# Patient Record
Sex: Female | Born: 1960 | Race: White | Hispanic: Refuse to answer | Marital: Married | Smoking: Never smoker
Health system: Southern US, Community
[De-identification: ages and names within clinical notes are randomized; demographics above are authoritative.]

## PROBLEM LIST (undated history)

## (undated) DIAGNOSIS — I10 Essential (primary) hypertension: Secondary | ICD-10-CM

## (undated) DIAGNOSIS — R32 Unspecified urinary incontinence: Secondary | ICD-10-CM

## (undated) DIAGNOSIS — K589 Irritable bowel syndrome without diarrhea: Secondary | ICD-10-CM

## (undated) DIAGNOSIS — J45909 Unspecified asthma, uncomplicated: Secondary | ICD-10-CM

## (undated) DIAGNOSIS — M8580 Other specified disorders of bone density and structure, unspecified site: Secondary | ICD-10-CM

## (undated) DIAGNOSIS — K219 Gastro-esophageal reflux disease without esophagitis: Secondary | ICD-10-CM

## (undated) DIAGNOSIS — J42 Unspecified chronic bronchitis: Secondary | ICD-10-CM

## (undated) DIAGNOSIS — M797 Fibromyalgia: Secondary | ICD-10-CM

## (undated) DIAGNOSIS — D329 Benign neoplasm of meninges, unspecified: Secondary | ICD-10-CM

## (undated) DIAGNOSIS — R5382 Chronic fatigue, unspecified: Secondary | ICD-10-CM

## (undated) DIAGNOSIS — H409 Unspecified glaucoma: Secondary | ICD-10-CM

## (undated) DIAGNOSIS — R002 Palpitations: Secondary | ICD-10-CM

## (undated) HISTORY — PX: ERCP: SHX60

## (undated) HISTORY — PX: CHOLECYSTECTOMY: SHX55

## (undated) HISTORY — DX: Unspecified urinary incontinence: R32

## (undated) HISTORY — DX: Gastro-esophageal reflux disease without esophagitis: K21.9

## (undated) HISTORY — DX: Fibromyalgia: M79.7

## (undated) HISTORY — DX: Irritable bowel syndrome, unspecified: K58.9

## (undated) HISTORY — DX: Palpitations: R00.2

## (undated) HISTORY — DX: Unspecified glaucoma: H40.9

## (undated) HISTORY — DX: Other specified disorders of bone density and structure, unspecified site: M85.80

## (undated) HISTORY — DX: Unspecified chronic bronchitis: J42

## (undated) HISTORY — DX: Benign neoplasm of meninges, unspecified: D32.9

## (undated) HISTORY — DX: Chronic fatigue, unspecified: R53.82

## (undated) HISTORY — DX: Essential (primary) hypertension: I10

---

## 1987-05-26 HISTORY — PX: CHOLECYSTECTOMY: SHX55

## 2005-06-09 ENCOUNTER — Other Ambulatory Visit: Admission: RE | Admit: 2005-06-09 | Discharge: 2005-06-09 | Payer: Self-pay | Admitting: Family Medicine

## 2005-07-17 ENCOUNTER — Encounter: Admission: RE | Admit: 2005-07-17 | Discharge: 2005-07-17 | Payer: Self-pay | Admitting: Family Medicine

## 2007-07-19 ENCOUNTER — Other Ambulatory Visit: Admission: RE | Admit: 2007-07-19 | Discharge: 2007-07-19 | Payer: Self-pay | Admitting: Family Medicine

## 2007-08-18 ENCOUNTER — Encounter: Admission: RE | Admit: 2007-08-18 | Discharge: 2007-08-18 | Payer: Self-pay | Admitting: Family Medicine

## 2011-01-28 ENCOUNTER — Other Ambulatory Visit: Payer: Self-pay | Admitting: Family Medicine

## 2011-01-28 DIAGNOSIS — M545 Low back pain, unspecified: Secondary | ICD-10-CM

## 2011-01-29 ENCOUNTER — Ambulatory Visit
Admission: RE | Admit: 2011-01-29 | Discharge: 2011-01-29 | Disposition: A | Discharge status: Home / Self Care | Payer: BC Managed Care – PPO | Source: Ambulatory Visit | Attending: Family Medicine | Admitting: Family Medicine

## 2011-01-29 ENCOUNTER — Other Ambulatory Visit: Payer: Self-pay | Admitting: Family Medicine

## 2011-01-29 DIAGNOSIS — M545 Low back pain, unspecified: Secondary | ICD-10-CM

## 2011-01-30 ENCOUNTER — Other Ambulatory Visit: Payer: Self-pay | Admitting: Family Medicine

## 2011-01-30 DIAGNOSIS — M25551 Pain in right hip: Secondary | ICD-10-CM

## 2011-01-30 DIAGNOSIS — N289 Disorder of kidney and ureter, unspecified: Secondary | ICD-10-CM

## 2011-02-01 ENCOUNTER — Inpatient Hospital Stay: Admission: RE | Admit: 2011-02-01 | Payer: BC Managed Care – PPO | Source: Ambulatory Visit

## 2011-02-01 ENCOUNTER — Other Ambulatory Visit: Payer: BC Managed Care – PPO

## 2011-02-06 ENCOUNTER — Ambulatory Visit
Admission: RE | Admit: 2011-02-06 | Discharge: 2011-02-06 | Disposition: A | Discharge status: Home / Self Care | Payer: BC Managed Care – PPO | Source: Ambulatory Visit | Attending: Family Medicine | Admitting: Family Medicine

## 2011-02-06 DIAGNOSIS — M25551 Pain in right hip: Secondary | ICD-10-CM

## 2011-02-06 DIAGNOSIS — N289 Disorder of kidney and ureter, unspecified: Secondary | ICD-10-CM

## 2011-02-06 MED ORDER — GADOBENATE DIMEGLUMINE 529 MG/ML IV SOLN
18.0000 mL | Freq: Once | INTRAVENOUS | Status: AC | PRN
  Administered 2011-02-06: 18 mL via INTRAVENOUS

## 2013-10-20 ENCOUNTER — Emergency Department (HOSPITAL_COMMUNITY): Payer: BC Managed Care – PPO

## 2013-10-20 ENCOUNTER — Encounter (HOSPITAL_COMMUNITY): Payer: Self-pay | Admitting: Emergency Medicine

## 2013-10-20 ENCOUNTER — Emergency Department (HOSPITAL_COMMUNITY)
Admission: EM | Admit: 2013-10-20 | Discharge: 2013-10-20 | Disposition: A | Discharge status: Home / Self Care | Payer: BC Managed Care – PPO | Attending: Emergency Medicine | Admitting: Emergency Medicine

## 2013-10-20 DIAGNOSIS — Z7982 Long term (current) use of aspirin: Secondary | ICD-10-CM | POA: Insufficient documentation

## 2013-10-20 DIAGNOSIS — J45909 Unspecified asthma, uncomplicated: Secondary | ICD-10-CM | POA: Insufficient documentation

## 2013-10-20 DIAGNOSIS — Z79899 Other long term (current) drug therapy: Secondary | ICD-10-CM | POA: Insufficient documentation

## 2013-10-20 DIAGNOSIS — R1013 Epigastric pain: Secondary | ICD-10-CM

## 2013-10-20 HISTORY — DX: Unspecified asthma, uncomplicated: J45.909

## 2013-10-20 LAB — BASIC METABOLIC PANEL
BUN: 13 mg/dL (ref 6–23)
CALCIUM: 9 mg/dL (ref 8.4–10.5)
CHLORIDE: 103 meq/L (ref 96–112)
CO2: 26 mEq/L (ref 19–32)
CREATININE: 0.89 mg/dL (ref 0.50–1.10)
GFR calc non Af Amer: 74 mL/min — ABNORMAL LOW (ref >90)
GFR, EST AFRICAN AMERICAN: 86 mL/min — AB (ref >90)
GLUCOSE: 95 mg/dL (ref 70–99)
Potassium: 4.4 mEq/L (ref 3.7–5.3)
SODIUM: 140 meq/L (ref 137–147)

## 2013-10-20 LAB — CBC
HCT: 41.6 % (ref 36.0–46.0)
Hemoglobin: 13.9 g/dL (ref 12.0–15.0)
MCH: 31.9 pg (ref 26.0–34.0)
MCHC: 33.4 g/dL (ref 30.0–36.0)
MCV: 95.4 fL (ref 78.0–100.0)
PLATELETS: 286 10*3/uL (ref 150–400)
RBC: 4.36 MIL/uL (ref 3.87–5.11)
RDW: 13.7 % (ref 11.5–15.5)
WBC: 7.2 10*3/uL (ref 4.0–10.5)

## 2013-10-20 LAB — TROPONIN I: Troponin I: <0.3 ng/mL (ref <0.30)

## 2013-10-20 LAB — I-STAT TROPONIN, ED: Troponin i, poc: 0 ng/mL (ref 0.00–0.08)

## 2013-10-20 NOTE — Discharge Instructions (Signed)
Chest Pain (Nonspecific) °It is often hard to give a specific diagnosis for the cause of chest pain. There is always a chance that your pain could be related to something serious, such as a heart attack or a blood clot in the lungs. You need to follow up with your caregiver for further evaluation. °CAUSES  °· Heartburn. °· Pneumonia or bronchitis. °· Anxiety or stress. °· Inflammation around your heart (pericarditis) or lung (pleuritis or pleurisy). °· A blood clot in the lung. °· A collapsed lung (pneumothorax). It can develop suddenly on its own (spontaneous pneumothorax) or from injury (trauma) to the chest. °· Shingles infection (herpes zoster virus). °The chest wall is composed of bones, muscles, and cartilage. Any of these can be the source of the pain. °· The bones can be bruised by injury. °· The muscles or cartilage can be strained by coughing or overwork. °· The cartilage can be affected by inflammation and become sore (costochondritis). °DIAGNOSIS  °Lab tests or other studies, such as X-rays, electrocardiography, stress testing, or cardiac imaging, may be needed to find the cause of your pain.  °TREATMENT  °· Treatment depends on what may be causing your chest pain. Treatment may include: °· Acid blockers for heartburn. °· Anti-inflammatory medicine. °· Pain medicine for inflammatory conditions. °· Antibiotics if an infection is present. °· You may be advised to change lifestyle habits. This includes stopping smoking and avoiding alcohol, caffeine, and chocolate. °· You may be advised to keep your head raised (elevated) when sleeping. This reduces the chance of acid going backward from your stomach into your esophagus. °· Most of the time, nonspecific chest pain will improve within 2 to 3 days with rest and mild pain medicine. °HOME CARE INSTRUCTIONS  °· If antibiotics were prescribed, take your antibiotics as directed. Finish them even if you start to feel better. °· For the next few days, avoid physical  activities that bring on chest pain. Continue physical activities as directed. °· Do not smoke. °· Avoid drinking alcohol. °· Only take over-the-counter or prescription medicine for pain, discomfort, or fever as directed by your caregiver. °· Follow your caregiver's suggestions for further testing if your chest pain does not go away. °· Keep any follow-up appointments you made. If you do not go to an appointment, you could develop lasting (chronic) problems with pain. If there is any problem keeping an appointment, you must call to reschedule. °SEEK MEDICAL CARE IF:  °· You think you are having problems from the medicine you are taking. Read your medicine instructions carefully. °· Your chest pain does not go away, even after treatment. °· You develop a rash with blisters on your chest. °SEEK IMMEDIATE MEDICAL CARE IF:  °· You have increased chest pain or pain that spreads to your arm, neck, jaw, back, or abdomen. °· You develop shortness of breath, an increasing cough, or you are coughing up blood. °· You have severe back or abdominal pain, feel nauseous, or vomit. °· You develop severe weakness, fainting, or chills. °· You have a fever. °THIS IS AN EMERGENCY. Do not wait to see if the pain will go away. Get medical help at once. Call your local emergency services (911 in [place] Do not drive yourself to the hospital. °MAKE SURE YOU:  °· Understand these instructions. °· Will watch your condition. °· Will get help right away if you are not doing well or get worse. °Document Released: 02/18/2005 Document Revised: 08/03/2011 Document Reviewed: 12/15/2007 °ExitCare® Patient Information ©2014 [employer]   [employer]. °Gastroesophageal Reflux Disease, Adult °Gastroesophageal reflux disease (GERD) happens when acid from your stomach flows up into the esophagus. When acid comes in contact with the esophagus, the acid causes soreness (inflammation) in the esophagus. Over time, GERD may create small holes (ulcers) in the lining of the  esophagus. °CAUSES  °· Increased body weight. This puts pressure on the stomach, making acid rise from the stomach into the esophagus. °· Smoking. This increases acid production in the stomach. °· Drinking alcohol. This causes decreased pressure in the lower esophageal sphincter (valve or ring of muscle between the esophagus and stomach), allowing acid from the stomach into the esophagus. °· Late evening meals and a full stomach. This increases pressure and acid production in the stomach. °· A malformed lower esophageal sphincter. °Sometimes, no cause is found. °SYMPTOMS  °· Burning pain in the lower part of the mid-chest behind the breastbone and in the mid-stomach area. This may occur twice a week or more often. °· Trouble swallowing. °· Sore throat. °· Dry cough. °· Asthma-like symptoms including chest tightness, shortness of breath, or wheezing. °DIAGNOSIS  °Your caregiver may be able to diagnose GERD based on your symptoms. In some cases, X-rays and other tests may be done to check for complications or to check the condition of your stomach and esophagus. °TREATMENT  °Your caregiver may recommend over-the-counter or prescription medicines to help decrease acid production. Ask your caregiver before starting or adding any new medicines.  °HOME CARE INSTRUCTIONS  °· Change the factors that you can control. Ask your caregiver for guidance concerning weight loss, quitting smoking, and alcohol consumption. °· Avoid foods and drinks that make your symptoms worse, such as: °· Caffeine or alcoholic drinks. °· Chocolate. °· Peppermint or mint flavorings. °· Garlic and onions. °· Spicy foods. °· Citrus fruits, such as oranges, lemons, or limes. °· Tomato-based foods such as sauce, chili, salsa, and pizza. °· Fried and fatty foods. °· Avoid lying down for the 3 hours prior to your bedtime or prior to taking a nap. °· Eat small, frequent meals instead of large meals. °· Wear loose-fitting clothing. Do not wear anything  tight around your waist that causes pressure on your stomach. °· Raise the head of your bed 6 to 8 inches with wood blocks to help you sleep. Extra pillows will not help. °· Only take over-the-counter or prescription medicines for pain, discomfort, or fever as directed by your caregiver. °· Do not take aspirin, ibuprofen, or other nonsteroidal anti-inflammatory drugs (NSAIDs). °SEEK IMMEDIATE MEDICAL CARE IF:  °· You have pain in your arms, neck, jaw, teeth, or back. °· Your pain increases or changes in intensity or duration. °· You develop nausea, vomiting, or sweating (diaphoresis). °· You develop shortness of breath, or you faint. °· Your vomit is green, yellow, black, or looks like coffee grounds or blood. °· Your stool is red, bloody, or black. °These symptoms could be signs of other problems, such as heart disease, gastric bleeding, or esophageal bleeding. °MAKE SURE YOU:  °· Understand these instructions. °· Will watch your condition. °· Will get help right away if you are not doing well or get worse. °Document Released: 02/18/2005 Document Revised: 08/03/2011 Document Reviewed: 11/28/2010 °ExitCare® Patient Information ©2014 [employer]. ° °

## 2013-10-20 NOTE — ED Notes (Addendum)
Pt c/o center to left chest pain onset 0300 this morning with some shortness of breath. Pt talking in complete sentences without difficulty. Skin w/d. Pt describes pain as burning and aching. Pt reports that she took 405 mg of ASA at 0330. Pt also reports that she took 162 mg of ASA before bed

## 2013-10-20 NOTE — ED Provider Notes (Signed)
CSN: [id number]     Arrival date & time 10/20/13  4098 History   First MD Initiated Contact with Patient 10/20/13 0732     Chief Complaint  Patient presents with  . Chest Pain     (Consider location/radiation/quality/duration/timing/severity/associated sxs/prior Treatment) HPI 53 year old female comes today complaining of chest pain. She states she has had some intermittent chest pain over the extended period of time. 6 months ago she had a stress test done which she says was normal. She has intermittent sharp pain that is momentary in nature that has woken her up at night that prompted these exams. She states she continues to have this intermittent sharp pain that wakes her up but goes away after second. Last night the same pain woke her up but continued to come in waves over 3 hours. She denies any associated symptoms such as dyspnea, diaphoresis, nausea, or vomiting with this. She points to the epigastrium as the location and does not feel that it radiates. She is unable to state any other assistive factors. She denies cough or fever. She has some history of wheezing with infections. She does not take any daily medications.  Past Medical History  Diagnosis Date  . Asthma    Past Surgical History  Procedure Laterality Date  . Cholecystectomy     No family history on file. History  Substance Use Topics  . Smoking status: Never Smoker   . Smokeless tobacco: Not on file  . Alcohol Use: Yes     Comment: rare   OB History   Grav Para Term Preterm Abortions TAB SAB Ect Mult Living                 Review of Systems  All other systems reviewed and are negative.     Allergies  Review of patient's allergies indicates no known allergies.  Home Medications   Prior to Admission medications   Medication Sig Start Date End Date Taking? Authorizing Provider  aspirin EC 81 MG tablet Take 81 mg by mouth daily.   Yes Historical Provider, MD  esomeprazole (NEXIUM) 20 MG capsule Take 20  mg by mouth daily as needed (heartburn).   Yes Historical Provider, MD  Vitamin D, Ergocalciferol, (DRISDOL) 50000 UNITS CAPS capsule Take 1 capsule by mouth every 7 (seven) days. Every Friday 10/06/13   Historical Provider, MD   BP 163/101  Pulse 73  Temp(Src) 97.8 F (36.6 C) (Oral)  Resp 20  Ht 5\' 5"  (1.651 m)  Wt 170 lb (77.111 kg)  BMI 28.29 kg/m2  SpO2 100% Physical Exam  Nursing note and vitals reviewed. Constitutional: She is oriented to person, place, and time. She appears well-developed and well-nourished.  HENT:  Head: Normocephalic and atraumatic.  Right Ear: External ear normal.  Left Ear: External ear normal.  Nose: Nose normal.  Mouth/Throat: Oropharynx is clear and moist.  Eyes: Conjunctivae and EOM are normal. Pupils are equal, round, and reactive to light.  Neck: Normal range of motion. Neck supple. No JVD present. No tracheal deviation present. No thyromegaly present.  Cardiovascular: Normal rate, regular rhythm, normal heart sounds and intact distal pulses.   Pulmonary/Chest: Effort normal and breath sounds normal. No respiratory distress. She has no wheezes.  Abdominal: Soft. Bowel sounds are normal. She exhibits no mass. There is no tenderness. There is no guarding.  Musculoskeletal: Normal range of motion.  Lymphadenopathy:    She has no cervical adenopathy.  Neurological: She is alert and oriented to person, place, and  time. She has normal reflexes. No cranial nerve deficit or sensory deficit. Gait normal. GCS eye subscore is 4. GCS verbal subscore is 5. GCS motor subscore is 6.  Reflex Scores:      Bicep reflexes are 2+ on the right side and 2+ on the left side.      Patellar reflexes are 2+ on the right side and 2+ on the left side. Strength is 5/5 bilateral elbow flexor/extensors, wrist extension/flexion, intrinsic hand strength equal Bilateral hip flexion/extension 5/5, knee flexion/extension 5/5, ankle 5/5 flexion extension    Skin: Skin is warm and  dry.  Psychiatric: She has a normal mood and affect. Her behavior is normal. Judgment and thought content normal.    ED Course  Procedures (including critical care time) Labs Review Labs Reviewed  BASIC METABOLIC PANEL - Abnormal; Notable for the following:    GFR calc non Af Amer 74 (*)    GFR calc Af Amer 86 (*)    All other components within normal limits  CBC  TROPONIN I  I-STAT TROPOININ, ED    Imaging Review Dg Chest 2 View (if Patient Has Fever And/or Copd)  10/20/2013   CLINICAL DATA:  Chest pain .  EXAM: CHEST  2 VIEW  COMPARISON:  Chest x-Gilmer Kaminsky 06/19/2013.  FINDINGS: The heart size and mediastinal contours are within normal limits. Both lungs are clear. The visualized skeletal structures are unremarkable.  IMPRESSION: No active cardiopulmonary disease.  Stable exam.   Electronically Signed   By: Marcello Moores  Register   On: 10/20/2013 07:29     EKG Interpretation   Date/Time:  Friday Oct 20 2013 10:20:08 EDT Ventricular Rate:  59 PR Interval:  141 QRS Duration: 99 QT Interval:  433 QTC Calculation: 429 R Axis:   67 Text Interpretation:  Sinus rhythm Low voltage, precordial leads Confirmed  by Awad Gladd MD, Andee Poles ([id number]) on 10/20/2013 12:03:58 PM      MDM   Final diagnoses:  Epigastric pain    53 year old previously healthy female presents today with atypical chest pain. It lasted a total of 3 hours but has since resolved. First troponin and EKG are normal and delta  2 hours EKG and troponin are obtained with continued normal troponin  patient advised to followup with her primary care physician. She is given return precautions and voices understanding to    Shaune Pollack, MD 10/20/13 1206

## 2013-10-20 NOTE — ED Notes (Signed)
Pt alert x4 respirations easy non labored. Skin w/d 

## 2013-10-24 ENCOUNTER — Ambulatory Visit
Admission: RE | Admit: 2013-10-24 | Discharge: 2013-10-24 | Disposition: A | Discharge status: Home / Self Care | Payer: BC Managed Care – PPO | Source: Ambulatory Visit

## 2013-10-24 ENCOUNTER — Other Ambulatory Visit: Payer: Self-pay

## 2013-10-24 DIAGNOSIS — Z1231 Encounter for screening mammogram for malignant neoplasm of breast: Secondary | ICD-10-CM

## 2013-11-22 HISTORY — PX: TRANSTHORACIC ECHOCARDIOGRAM: SHX275

## 2013-11-27 ENCOUNTER — Other Ambulatory Visit (HOSPITAL_COMMUNITY): Payer: Self-pay | Admitting: Family Medicine

## 2013-11-27 DIAGNOSIS — R002 Palpitations: Secondary | ICD-10-CM

## 2013-12-11 ENCOUNTER — Inpatient Hospital Stay (HOSPITAL_COMMUNITY): Admission: RE | Admit: 2013-12-11 | Payer: BC Managed Care – PPO | Source: Ambulatory Visit

## 2013-12-14 ENCOUNTER — Ambulatory Visit (HOSPITAL_COMMUNITY)
Admission: RE | Admit: 2013-12-14 | Discharge: 2013-12-14 | Disposition: A | Discharge status: Home / Self Care | Payer: BC Managed Care – PPO | Source: Ambulatory Visit | Attending: Family Medicine | Admitting: Family Medicine

## 2013-12-14 DIAGNOSIS — I369 Nonrheumatic tricuspid valve disorder, unspecified: Secondary | ICD-10-CM

## 2013-12-14 DIAGNOSIS — R002 Palpitations: Secondary | ICD-10-CM | POA: Insufficient documentation

## 2013-12-14 NOTE — Progress Notes (Signed)
2D Echo Performed 12/14/2013    Marygrace Drought, RCS

## 2013-12-16 ENCOUNTER — Telehealth: Payer: Self-pay | Admitting: Cardiovascular Disease

## 2013-12-16 NOTE — Telephone Encounter (Signed)
Closed encounter °

## 2014-01-25 ENCOUNTER — Ambulatory Visit: Payer: BC Managed Care – PPO | Admitting: Cardiovascular Disease

## 2014-02-14 ENCOUNTER — Other Ambulatory Visit: Payer: Self-pay | Admitting: Family Medicine

## 2014-02-14 DIAGNOSIS — R198 Other specified symptoms and signs involving the digestive system and abdomen: Secondary | ICD-10-CM

## 2014-02-16 ENCOUNTER — Ambulatory Visit (INDEPENDENT_AMBULATORY_CARE_PROVIDER_SITE_OTHER): Payer: BC Managed Care – PPO

## 2014-02-16 DIAGNOSIS — R198 Other specified symptoms and signs involving the digestive system and abdomen: Secondary | ICD-10-CM

## 2014-02-16 DIAGNOSIS — R002 Palpitations: Secondary | ICD-10-CM

## 2014-03-09 ENCOUNTER — Other Ambulatory Visit: Payer: Self-pay

## 2015-05-31 ENCOUNTER — Ambulatory Visit (INDEPENDENT_AMBULATORY_CARE_PROVIDER_SITE_OTHER): Payer: BLUE CROSS/BLUE SHIELD | Admitting: Family Medicine

## 2015-05-31 ENCOUNTER — Encounter: Payer: Self-pay | Admitting: Family Medicine

## 2015-05-31 VITALS — BP 129/87 | HR 72 | Temp 98.1°F | Resp 20 | Ht 65.0 in | Wt 169.0 lb

## 2015-05-31 DIAGNOSIS — R319 Hematuria, unspecified: Secondary | ICD-10-CM

## 2015-05-31 DIAGNOSIS — Z87440 Personal history of urinary (tract) infections: Secondary | ICD-10-CM | POA: Diagnosis not present

## 2015-05-31 DIAGNOSIS — Z7189 Other specified counseling: Secondary | ICD-10-CM

## 2015-05-31 DIAGNOSIS — Z23 Encounter for immunization: Secondary | ICD-10-CM | POA: Diagnosis not present

## 2015-05-31 DIAGNOSIS — Z7689 Persons encountering health services in other specified circumstances: Secondary | ICD-10-CM

## 2015-05-31 DIAGNOSIS — N390 Urinary tract infection, site not specified: Secondary | ICD-10-CM | POA: Diagnosis not present

## 2015-05-31 LAB — URINALYSIS, ROUTINE W REFLEX MICROSCOPIC
Bilirubin Urine: NEGATIVE
KETONES UR: NEGATIVE
Leukocytes, UA: NEGATIVE
Nitrite: NEGATIVE
SPECIFIC GRAVITY, URINE: 1.015 (ref 1.000–1.030)
Total Protein, Urine: NEGATIVE
UROBILINOGEN UA: 0.2 (ref 0.0–1.0)
Urine Glucose: NEGATIVE
pH: 6.5 (ref 5.0–8.0)

## 2015-05-31 LAB — POC URINALSYSI DIPSTICK (AUTOMATED)
BILIRUBIN UA: NEGATIVE
Glucose, UA: NEGATIVE
Ketones, UA: NEGATIVE
Leukocytes, UA: NEGATIVE
NITRITE UA: NEGATIVE
PH UA: 6.5
Protein, UA: NEGATIVE
Spec Grav, UA: 1.02
UROBILINOGEN UA: 0.2

## 2015-05-31 NOTE — Progress Notes (Signed)
Subjective:    Patient ID: Rebecca Pollard, female    DOB: [birth year], 55 y.o.   MRN: [id number]  HPI   Patient presents for new patient establishment with UTI. All past medical history, surgical history, allergies, family history, immunizations and social history was obtained from the patient today and entered into the electronic medical record. Records are requested from her prior PCP, and will be reviewed at the time they are received. All medical records will be updated at that time.  UTI: [facility] with [facility]TI on 05/23/2015. She was treated with macrobid x 5 d. Initial symptoms consist of fever, chills, urinary frequency, dysuria, discolored urine, foul odor to urine. Currently she endorses resolution of symptoms.  Pt denies nausea, vomit, fever, chills, low back pain, dysuria or urinary frequency.  Last couple of years she has been getting a UTI 3-4x a year.   Past Medical History  Diagnosis Date  . Asthma   . Hypertension   . GERD (gastroesophageal reflux disease)   . Glaucoma   . Urinary incontinence    No Known Allergies Past Surgical History  Procedure Laterality Date  . Cholecystectomy     Family History  Problem Relation Age of Onset  . Hearing loss Mother   . Arthritis Father   . Hearing loss Father    Social History   Social History  . Marital Status: Married    Spouse Name: N/A  . Number of Children: N/A  . Years of Education: N/A   Occupational History  . Not on file.   Social History Main Topics  . Smoking status: Never Smoker   . Smokeless tobacco: Never Used  . Alcohol Use: Yes     Comment: rare  . Drug Use: No  . Sexual Activity: Yes    Birth Control/ Protection: Other-see comments     Comment: husband with vasectomy   Other Topics Concern  . Not on file   Social History Narrative   Married to [place]. 2 children, Alice and Bar Nunn.    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    Drinks caffeine, herbal  remedies, daily vitamin use.    Wears her seatbelt, smoke detector in the home, no firearms in the home.    Feels safe in her relationships.    Exercises 2x a week.     Review of Systems Negative, with the exception of above mentioned in HPI     Objective:   Physical Exam BP 129/87 mmHg  Pulse 72  Temp(Src) 98.1 F (36.7 C)  Resp 20  Ht 5\' 5"  (1.651 m)  Wt 169 lb (76.658 kg)  BMI 28.12 kg/m2  SpO2 97%  LMP 04/15/2015 Gen: Afebrile. No acute distress. Nontoxic in appearance, well-developed, well-nourished, Caucasian female, Pleasant. HENT: AT. [place]. Bilateral TM visualized and normal in appearance. MMM. Bilateral nares without erythema or swelling. Throat without erythema or exudates. Eyes:Pupils Equal Round Reactive to light, Extraocular movements intact,  Conjunctiva without redness, discharge or icterus. Neck/lymp/endocrine: Supple, no lymphadenopathy CV: RRR, No edema, +2/4 P posterior tibialis pulses Chest: CTAB, no wheeze or crackles Abd: Soft. Flat. NTND. BS present. No  Masses palpated.  Skin: No rashes, purpura or petechiae.  Neuro: Normal gait. PERLA. EOMi. Alert. Oriented x3  Psych: Normal affect, dress and demeanor. Normal speech. Normal thought content and judgment.    Assessment & Plan:  Rebecca Pollard is a 55 y.o. female present for establishment of care and follo wup to [facility]  [facility]. 1. Recent urinary tract infection/Urinary tract infection with hematuria, site unspecified - POCT Urinalysis Dipstick (Automated) - Urinalysis, Routine w reflex microscopic - small blood on urine dip- pt states she is told she always has blood in her urine. - AVS on bladder irritants (outisde source) and interstitial cystitis.   2. Need for prophylactic vaccination and inoculation against influenza - Flu Vaccine QUAD 36+ mos PF IM (Fluarix & Fluzone Quad PF)  Patient to follow-up with 2 months for complete physical (would repeat urine at that time)

## 2015-05-31 NOTE — Patient Instructions (Signed)
Interstitial Cystitis Interstitial cystitis is a condition that causes inflammation of the bladder. The bladder is a hollow organ in the lower part of your abdomen. It stores urine after the urine is made by your kidneys. With interstitial cystitis, you may have pain in the bladder area. You may also have a frequent and urgent need to urinate. The severity of interstitial cystitis can vary from person to person. You may have flare-ups of the condition, and then it may go away for a while. For many people who have this condition, it becomes a long-term problem. CAUSES The cause of this condition is not known. RISK FACTORS This condition is more likely to develop in women. SYMPTOMS Symptoms of interstitial cystitis vary, and they can change over time. Symptoms may include:  Discomfort or pain in the bladder area. This can range from mild to severe. The pain may change in intensity as the bladder fills with urine or as it empties.  Pelvic pain.  An urgent need to urinate.  Frequent urination.  Pain during sexual intercourse.  Pinpoint bleeding on the bladder wall. For women, the symptoms often get worse during menstruation. DIAGNOSIS This condition is diagnosed by evaluating your symptoms and ruling out other causes. A physical exam will be done. Various tests may be done to rule out other conditions. Common tests include:  Urine tests.  Cystoscopy. In this test, a tool that is like a very thin telescope is used to look into your bladder.  Biopsy. This involves taking a sample of tissue from the bladder wall to be examined under a microscope. TREATMENT There is no cure for interstitial cystitis, but treatment methods are available to control your symptoms. Work closely with your health care provider to find the treatments that will be most effective for you. Treatment options may include:  Medicines to relieve pain and to help reduce the number of times that you feel the need to  urinate.  Bladder training. This involves learning ways to control when you urinate, such as:  Urinating at scheduled times.  Training yourself to delay urination.  Doing exercises (Kegel exercises) to strengthen the muscles that control urine flow.  Lifestyle changes, such as changing your diet or taking steps to control stress.  Use of a device that provides electrical stimulation in order to reduce pain.  A procedure that stretches your bladder by filling it with air or fluid.  Surgery. This is rare. It is only done for extreme cases if other treatments do not help. HOME CARE INSTRUCTIONS  Take medicines only as directed by your health care provider.  Use bladder training techniques as directed.  Keep a bladder diary to find out which foods, liquids, or activities make your symptoms worse.  Use your bladder diary to schedule bathroom trips. If you are away from home, plan to be near a bathroom at each of your scheduled times.  Make sure you urinate just before you leave the house and just before you go to bed.  Do Kegel exercises as directed by your health care provider.  Do not drink alcohol.  Do not use any tobacco products, including cigarettes, chewing tobacco, or electronic cigarettes. If you need help quitting, ask your health care provider.  Make dietary changes as directed by your health care provider. You may need to avoid spicy foods and foods that contain a high amount of potassium.  Limit your drinking of beverages that stimulate urination. These include soda, coffee, and tea.  Keep all follow-up   visits as directed by your health care provider. This is important. SEEK MEDICAL CARE IF:  Your symptoms do not get better after treatment.  Your pain and discomfort are getting worse.  You have more frequent urges to urinate.  You have a fever. SEEK IMMEDIATE MEDICAL CARE IF:  You are not able to control your bladder at all.   This information is not  intended to replace advice given to you by your health care provider. Make sure you discuss any questions you have with your health care provider.   Document Released: 01/10/2004 Document Revised: 06/01/2014 Document Reviewed: 01/16/2014 Elsevier Interactive Patient Education [employer].  2 months or time when it is at least 1 year and 1 day past your last physical.

## 2015-06-03 ENCOUNTER — Encounter: Payer: Self-pay | Admitting: Family Medicine

## 2015-07-29 ENCOUNTER — Ambulatory Visit (INDEPENDENT_AMBULATORY_CARE_PROVIDER_SITE_OTHER): Payer: BLUE CROSS/BLUE SHIELD | Admitting: Family Medicine

## 2015-07-29 ENCOUNTER — Encounter: Payer: Self-pay | Admitting: Family Medicine

## 2015-07-29 VITALS — BP 132/88 | HR 79 | Temp 98.5°F | Resp 20 | Wt 165.5 lb

## 2015-07-29 DIAGNOSIS — Z780 Asymptomatic menopausal state: Secondary | ICD-10-CM | POA: Insufficient documentation

## 2015-07-29 DIAGNOSIS — N951 Menopausal and female climacteric states: Secondary | ICD-10-CM

## 2015-07-29 MED ORDER — CONJ ESTROG-MEDROXYPROGEST ACE 0.3-1.5 MG PO TABS: 1.0000 | ORAL_TABLET | Freq: Every day | ORAL | Status: DC

## 2015-07-29 NOTE — Progress Notes (Signed)
Patient ID: Rebecca Pollard, female   DOB: 1960/10/05, 55 y.o.   MRN: [id number]    Rebecca Pollard , November 27, 1960, 55 y.o., female MRN: [id number]  CC: perimenopausal  Subjective: Pt presents for an OV with complaints of hot flashes, night time awakenings (insomnia),  Chronic fatigue, mood shifts, vaginal dryness. Patient's last menstrual period was 04/24/2015. Has had a few periods intermittent over the last year. Patient states the hot flashes are unbearable.   Low CV risk by calculation. Low 1% breast-cancer risk assessment.  No h/o DVT/blood clots.  No history of breast cancer.  Menses at 13.  3 childbirths.  Never smoker Pt did have palpitations and was on BB, at one time, she had discontinued that while ago and has had no return of her palpitations. Normal cardiac workup.    No Known Allergies Social History  Substance Use Topics  . Smoking status: Never Smoker   . Smokeless tobacco: Never Used  . Alcohol Use: Yes     Comment: rare   Past Medical History  Diagnosis Date  . Asthma   . Hypertension   . GERD (gastroesophageal reflux disease)   . Glaucoma   . Urinary incontinence   . Fibromyalgia   . IBS (irritable bowel syndrome)   . Chronic fatigue    Past Surgical History  Procedure Laterality Date  . Cholecystectomy     Family History  Problem Relation Age of Onset  . Hearing loss Mother   . Arthritis Father   . Hearing loss Father      Medication List       This list is accurate as of: 07/29/15  2:46 PM.  Always use your most recent med list.               aspirin EC 81 MG tablet  Take 81 mg by mouth daily.     Vitamin D (Ergocalciferol) 50000 units Caps capsule  Commonly known as:  DRISDOL  Take 1 capsule by mouth every 7 (seven) days. Every Friday         ROS: Negative, with the exception of above mentioned in HPI   Objective:  BP 132/88 mmHg  Pulse 79  Temp(Src) 98.5 F (36.9 C) (Oral)  Resp 20  Wt 165 lb 8 oz (75.07 kg)  SpO2 98%  LMP  04/24/2015 Body mass index is 27.54 kg/(m^2). Gen: Afebrile. No acute distress. Nontoxic in appearance. Well developed, well nourished. Female.  HENT: AT. [place].MMM, no oral lesions.  Eyes:Pupils Equal Round Reactive to light, Extraocular movements intact,  Conjunctiva without redness, discharge or icterus. CV: RRR  Abd: Soft.  NTND. BS present Neuro:Normal gait. PERLA. EOMi. Alert. Oriented x3 Psych: Normal affect, dress and demeanor. Normal speech. Normal thought content and judgment..   Assessment/Plan: Rebecca Pollard is a 55 y.o. female present for OV for  1. Perimenopausal symptoms - in depth discussion on the many possible variations on treatment. Pt decided on hormone therapy (low dose). Pt will need dual therapy,since she has a uterus,  and this was discussed with her.  - She does not have any contraindications that I am aware of. Discussed yearly mammograms with use. She did have palpitations in the past, discussed we will need to monitor for return of palpitations with this medication.  - estrogen, conjugated,-medroxyprogesterone (PREMPRO) 0.3-1.5 MG tablet; Take 1 tablet by mouth daily.  Dispense: 30 tablet; Refill: 1 - F/U 4 weeks.    electronically signed by:  Howard Pouch, DO  [facility] -  OR

## 2015-07-29 NOTE — Patient Instructions (Signed)
Perimenopause Perimenopause is the time when your body begins to move into the menopause (no menstrual period for 12 straight months). It is a natural process. Perimenopause can begin 2-8 years before the menopause and usually lasts for 1 year after the menopause. During this time, your ovaries may or may not produce an egg. The ovaries vary in their production of estrogen and progesterone hormones each month. This can cause irregular menstrual periods, difficulty getting pregnant, vaginal bleeding between periods, and uncomfortable symptoms. CAUSES  Irregular production of the ovarian hormones, estrogen and progesterone, and not ovulating every month.  Other causes include:  Tumor of the pituitary gland in the brain.  Medical disease that affects the ovaries.  Radiation treatment.  Chemotherapy.  Unknown causes.  Heavy smoking and excessive alcohol intake can bring on perimenopause sooner. SIGNS AND SYMPTOMS   Hot flashes.  Night sweats.  Irregular menstrual periods.  Decreased sex drive.  Vaginal dryness.  Headaches.  Mood swings.  Depression.  Memory problems.  Irritability.  Tiredness.  Weight gain.  Trouble getting pregnant.  The beginning of losing bone cells (osteoporosis).  The beginning of hardening of the arteries (atherosclerosis). DIAGNOSIS  Your health care provider will make a diagnosis by analyzing your age, menstrual history, and symptoms. He or she will do a physical exam and note any changes in your body, especially your female organs. Female hormone tests may or may not be helpful depending on the amount of female hormones you produce and when you produce them. However, other hormone tests may be helpful to rule out other problems. TREATMENT  In some cases, no treatment is needed. The decision on whether treatment is necessary during the perimenopause should be made by you and your health care provider based on how the symptoms are affecting you  and your lifestyle. Various treatments are available, such as:  Treating individual symptoms with a specific medicine for that symptom.  Herbal medicines that can help specific symptoms.  Counseling.  Group therapy. HOME CARE INSTRUCTIONS   Keep track of your menstrual periods (when they occur, how heavy they are, how long between periods, and how long they last) as well as your symptoms and when they started.  Only take over-the-counter or prescription medicines as directed by your health care provider.  Sleep and rest.  Exercise.  Eat a diet that contains calcium (good for your bones) and soy (acts like the estrogen hormone).  Do not smoke.  Avoid alcoholic beverages.  Take vitamin supplements as recommended by your health care provider. Taking vitamin E may help in certain cases.  Take calcium and vitamin D supplements to help prevent bone loss.  Group therapy is sometimes helpful.  Acupuncture may help in some cases. SEEK MEDICAL CARE IF:   You have questions about any symptoms you are having.  You need a referral to a specialist (gynecologist, psychiatrist, or psychologist). SEEK IMMEDIATE MEDICAL CARE IF:   You have vaginal bleeding.  Your period lasts longer than 8 days.  Your periods are recurring sooner than 21 days.  You have bleeding after intercourse.  You have severe depression.  You have pain when you urinate.  You have severe headaches.  You have vision problems.   This information is not intended to replace advice given to you by your health care provider. Make sure you discuss any questions you have with your health care provider.   Document Released: 06/18/2004 Document Revised: 06/01/2014 Document Reviewed: 12/08/2012 Elsevier Interactive Patient Education 2016 [employer]    I have called in a combination pill of estrogen and progesterone. Take as indicated in the packet.  I will need to followup in 4 weeks.

## 2015-08-07 ENCOUNTER — Telehealth: Payer: Self-pay | Admitting: Family Medicine

## 2015-08-07 NOTE — Telephone Encounter (Signed)
Pt is asking for a call back regarding antibiotics that she has taking in the pass. Pt needs the name, what they were for, and when she took them.

## 2015-08-07 NOTE — Telephone Encounter (Signed)
Spoke with patient recommended she call her pharmacy to get information.

## 2015-10-28 DIAGNOSIS — H527 Unspecified disorder of refraction: Secondary | ICD-10-CM | POA: Diagnosis not present

## 2015-10-28 DIAGNOSIS — H01009 Unspecified blepharitis unspecified eye, unspecified eyelid: Secondary | ICD-10-CM | POA: Diagnosis not present

## 2015-10-28 DIAGNOSIS — H2513 Age-related nuclear cataract, bilateral: Secondary | ICD-10-CM | POA: Diagnosis not present

## 2015-10-28 DIAGNOSIS — H409 Unspecified glaucoma: Secondary | ICD-10-CM | POA: Diagnosis not present

## 2015-12-30 ENCOUNTER — Encounter: Payer: Self-pay | Admitting: Family Medicine

## 2015-12-30 ENCOUNTER — Ambulatory Visit (INDEPENDENT_AMBULATORY_CARE_PROVIDER_SITE_OTHER): Payer: BLUE CROSS/BLUE SHIELD | Admitting: Family Medicine

## 2015-12-30 VITALS — BP 122/83 | HR 70 | Temp 98.5°F | Resp 20 | Wt 176.8 lb

## 2015-12-30 DIAGNOSIS — R1011 Right upper quadrant pain: Secondary | ICD-10-CM | POA: Diagnosis not present

## 2015-12-30 LAB — COMPREHENSIVE METABOLIC PANEL
ALT: 12 U/L (ref 6–29)
AST: 16 U/L (ref 10–35)
Albumin: 4.1 g/dL (ref 3.6–5.1)
Alkaline Phosphatase: 80 U/L (ref 33–130)
BUN: 13 mg/dL (ref 7–25)
CHLORIDE: 107 mmol/L (ref 98–110)
CO2: 24 mmol/L (ref 20–31)
Calcium: 9.3 mg/dL (ref 8.6–10.4)
Creat: 0.81 mg/dL (ref 0.50–1.05)
GLUCOSE: 84 mg/dL (ref 65–99)
POTASSIUM: 4.4 mmol/L (ref 3.5–5.3)
Sodium: 140 mmol/L (ref 135–146)
TOTAL PROTEIN: 6.7 g/dL (ref 6.1–8.1)
Total Bilirubin: 0.6 mg/dL (ref 0.2–1.2)

## 2015-12-30 LAB — CBC WITH DIFFERENTIAL/PLATELET
BASOS ABS: 0 {cells}/uL (ref 0–200)
Basophils Relative: 0 %
EOS ABS: 150 {cells}/uL (ref 15–500)
Eosinophils Relative: 2 %
HCT: 45 % (ref 35.0–45.0)
Hemoglobin: 15 g/dL (ref 11.7–15.5)
LYMPHS PCT: 24 %
Lymphs Abs: 1800 cells/uL (ref 850–3900)
MCH: 31.3 pg (ref 27.0–33.0)
MCHC: 33.3 g/dL (ref 32.0–36.0)
MCV: 93.9 fL (ref 80.0–100.0)
MONOS PCT: 7 %
MPV: 11.6 fL (ref 7.5–12.5)
Monocytes Absolute: 525 cells/uL (ref 200–950)
Neutro Abs: 5025 cells/uL (ref 1500–7800)
Neutrophils Relative %: 67 %
Platelets: 301 10*3/uL (ref 140–400)
RBC: 4.79 MIL/uL (ref 3.80–5.10)
RDW: 14.1 % (ref 11.0–15.0)
WBC: 7.5 10*3/uL (ref 3.8–10.8)

## 2015-12-30 LAB — C-REACTIVE PROTEIN: CRP: <0.5 mg/dL (ref <0.60)

## 2015-12-30 MED ORDER — BACLOFEN 10 MG PO TABS: 10.0000 mg | ORAL_TABLET | Freq: Two times a day (BID) | ORAL | 0 refills | Status: DC | PRN

## 2015-12-30 NOTE — Progress Notes (Signed)
Rebecca Pollard , [birth year], 55 y.o., female MRN: [id number] Patient Care Team    Relationship Specialty Notifications Start End  Ma Hillock, DO PCP - General Family Medicine  05/31/15     CC: abdomen pain  Subjective: Pt presents for an acute OV with complaints of RUQ pain of 1 day duration.  Associated symptoms include Recent increase in activity. Patient feels a spasm/aching feeling in her right lateral flank/abdomen. Patient has had a cholecystectomy. She states she experienced this pain while sitting on her couch eating popcorn, with minimal butter. Pain was very strong and was a spasm in nature, and did not resolve for a few hours. She states the pain/achiness portion is still mildly present. She states for dinner she had had grilled vegetables just about an hour prior to the onset of pain. Her bowels are moving regularly proximally 1 today. She denies nausea, vomit, constipation or diarrhea. She has never had a colonoscopy. She does admit to raking the yard the day prior. She states she might have a mild fever, no chills, no night sweats or unintentional weight loss. She has a history of IBS and GERD.  No Known Allergies Social History  Substance Use Topics  . Smoking status: Never Smoker  . Smokeless tobacco: Never Used  . Alcohol use Yes     Comment: rare   Past Medical History:  Diagnosis Date  . Asthma   . Chronic fatigue   . Fibromyalgia   . GERD (gastroesophageal reflux disease)   . Glaucoma   . Hypertension   . IBS (irritable bowel syndrome)   . Urinary incontinence    Past Surgical History:  Procedure Laterality Date  . CHOLECYSTECTOMY     Family History  Problem Relation Age of Onset  . Hearing loss Mother   . Arthritis Father   . Hearing loss Father      Medication List       Accurate as of 12/30/15  3:17 PM. Always use your most recent med list.          aspirin EC 81 MG tablet Take 81 mg by mouth daily.   PREVIDENT 5000 BOOSTER PLUS 1.1 %  Pste Generic drug:  Sodium Fluoride   Vitamin D (Ergocalciferol) 50000 units Caps capsule Commonly known as:  DRISDOL Take 1 capsule by mouth every 7 (seven) days. Every Friday       No results found for this or any previous visit (from the past 24 hour(s)). No results found.   ROS: Negative, with the exception of above mentioned in HPI   Objective:  BP 122/83 (BP Location: Right Arm, Patient Position: Sitting, Cuff Size: Normal)   Pulse 70   Temp 98.5 F (36.9 C) (Oral)   Resp 20   Wt 176 lb 12 oz (80.2 kg)   SpO2 96%   BMI 29.41 kg/m  Body mass index is 29.41 kg/m. Gen: Afebrile. No acute distress. Nontoxic in appearance, well developed, well nourished.  HENT: AT. [place]. MMM, no oral lesions.  Eyes:Pupils Equal Round Reactive to light, Extraocular movements intact,  Conjunctiva without redness, discharge or icterus. CV: RRR Chest: CTAB, no wheeze or crackles.   Abd: Soft. Round.ND. Tender to palpation superficially right flank, just below rib cage. BS present. No Masses palpated. No rebound or guarding. Negative Murphy's, negative McBurney's. MSK: No CVA tenderness bilaterally Skin: No rashes, purpura or petechiae.  Neuro:  Normal gait. PERLA. EOMi. Alert. Oriented x3  Psych: Normal affect, dress and demeanor.  Normal speech. Normal thought content and judgment.  Assessment/Plan: Rebecca Pollard is a 55 y.o. female present for acute OV for  RUQ pain - Pain likely secondary to strain muscle considering location exam. We'll collect labs today to rule out infection, inflammatory or abnormal liver function.  - Encourage patient to use NSAIDs and heat/ice therapy. - Baclofen prescribed. - CBC w/Diff - Comp Met (CMET) - C-reactive protein - Follow-up in 2-4 weeks if symptoms are not resolved, or worsening. > 25 minutes spent with patient, >50% of time spent face to face counseling patient and coordinating care.    electronically signed by:  Howard Pouch, DO  [place]

## 2015-12-30 NOTE — Patient Instructions (Signed)
I will call you with labs once available.  We will treat like a muscle issue, which it sounds like.  Take naproxen (aleve) 1 every 12 hours, with food.  Baclofen 10 mg daily as needed.  Heat therapy.

## 2015-12-31 ENCOUNTER — Encounter: Payer: Self-pay | Admitting: Family Medicine

## 2015-12-31 ENCOUNTER — Telehealth: Payer: Self-pay | Admitting: Family Medicine

## 2015-12-31 NOTE — Telephone Encounter (Signed)
Please call pt: Her labs are all normal. This is likely a pulled muscle.

## 2015-12-31 NOTE — Telephone Encounter (Signed)
Left message with lab results on patient voice mail 

## 2016-02-27 ENCOUNTER — Ambulatory Visit (HOSPITAL_BASED_OUTPATIENT_CLINIC_OR_DEPARTMENT_OTHER)
Admission: RE | Admit: 2016-02-27 | Discharge: 2016-02-27 | Disposition: A | Discharge status: Home / Self Care | Payer: BLUE CROSS/BLUE SHIELD | Source: Ambulatory Visit | Attending: Family Medicine | Admitting: Family Medicine

## 2016-02-27 ENCOUNTER — Telehealth: Payer: Self-pay | Admitting: Family Medicine

## 2016-02-27 ENCOUNTER — Ambulatory Visit (INDEPENDENT_AMBULATORY_CARE_PROVIDER_SITE_OTHER): Payer: BLUE CROSS/BLUE SHIELD | Admitting: Family Medicine

## 2016-02-27 ENCOUNTER — Encounter: Payer: Self-pay | Admitting: Family Medicine

## 2016-02-27 VITALS — BP 127/84 | HR 59 | Temp 98.2°F | Resp 20 | Wt 177.2 lb

## 2016-02-27 DIAGNOSIS — R079 Chest pain, unspecified: Secondary | ICD-10-CM | POA: Insufficient documentation

## 2016-02-27 DIAGNOSIS — R1013 Epigastric pain: Secondary | ICD-10-CM

## 2016-02-27 LAB — TSH: TSH: 1.05 u[IU]/mL (ref 0.35–4.50)

## 2016-02-27 LAB — COMPREHENSIVE METABOLIC PANEL
ALBUMIN: 4.1 g/dL (ref 3.5–5.2)
ALK PHOS: 95 U/L (ref 39–117)
ALT: 16 U/L (ref 0–35)
AST: 19 U/L (ref 0–37)
BILIRUBIN TOTAL: 0.6 mg/dL (ref 0.2–1.2)
BUN: 16 mg/dL (ref 6–23)
CALCIUM: 9 mg/dL (ref 8.4–10.5)
CO2: 23 mEq/L (ref 19–32)
CREATININE: 0.83 mg/dL (ref 0.40–1.20)
Chloride: 103 mEq/L (ref 96–112)
GFR: 76.49 mL/min (ref >60.00)
Glucose, Bld: 85 mg/dL (ref 70–99)
Potassium: 4.4 mEq/L (ref 3.5–5.1)
SODIUM: 138 meq/L (ref 135–145)
TOTAL PROTEIN: 7.4 g/dL (ref 6.0–8.3)

## 2016-02-27 LAB — LIPASE: Lipase: 30 U/L (ref 11.0–59.0)

## 2016-02-27 LAB — TROPONIN I: TNIDX: 0.01 ug/l (ref 0.00–0.06)

## 2016-02-27 NOTE — Progress Notes (Signed)
Rebecca Pollard , July 12, 1960, 55 y.o., female MRN: [id number] Patient Care Team    Relationship Specialty Notifications Start End  Ma Hillock, DO PCP - General Family Medicine  05/31/15     CC: chest pain  Subjective:  Patient presents for OV with complaints of Chest pain/epigastric pain. She states the pain occurred last night about 10 pm, radiated to her right jaw and she became hot and sweaty. She at first thought maybe it was something she ate, but did not eat anything out of the ordinary for her and dinner had been hours prior to onset of pain. She was watching TV when pain occurred. The pain lasted about 5 minutes, and improved after she took ASA and coughed (purposefully). She states the pain was not like pains she had in the past that were fleeting sharp pains, this was a deep ache. She denies chest pain or dyspnea on exertion. She did have dental work completed yesterday. She has been feeling fatigued. The pain has completely resolved.   No Known Allergies Social History  Substance Use Topics  . Smoking status: Never Smoker  . Smokeless tobacco: Never Used  . Alcohol use Yes     Comment: rare   Past Medical History:  Diagnosis Date  . Asthma   . Chronic fatigue   . Fibromyalgia   . GERD (gastroesophageal reflux disease)   . Glaucoma   . Hypertension   . IBS (irritable bowel syndrome)   . Urinary incontinence    Past Surgical History:  Procedure Laterality Date  . CHOLECYSTECTOMY     Family History  Problem Relation Age of Onset  . Hearing loss Mother   . Arthritis Father   . Hearing loss Father   . Stroke Father   . Breast cancer Paternal Grandmother   . Leukemia Maternal Uncle      Medication List       Accurate as of 02/27/16  8:39 AM. Always use your most recent med list.          aspirin EC 81 MG tablet Take 81 mg by mouth daily.   baclofen 10 MG tablet Commonly known as:  LIORESAL Take 1 tablet (10 mg total) by mouth 2 (two) times daily as  needed for muscle spasms.   co-enzyme Q-10 30 MG capsule Take 30 mg by mouth 3 (three) times daily.   DHEA 10 MG Caps Take 1 capsule by mouth.   PREVIDENT 5000 BOOSTER PLUS 1.1 % Pste Generic drug:  Sodium Fluoride   Vitamin D3 2400 UNIT/ML Liqd 2,400 Units by Does not apply route daily.       No results found for this or any previous visit (from the past 24 hour(s)). No results found.   ROS: Negative, with the exception of above mentioned in HPI   Objective:  BP (!) 152/97 (BP Location: Left Arm, Patient Position: Sitting, Cuff Size: Normal)   Pulse 60   Temp 98.2 F (36.8 C)   Resp 20   Wt 177 lb 4 oz (80.4 kg)   SpO2 98%   BMI 29.50 kg/m  Body mass index is 29.5 kg/m. Gen: Afebrile. No acute distress. Nontoxic in appearance, well developed, well nourished. Pleasant caucasian female.  HENT: AT.  MMM, Eyes:Pupils Equal Round Reactive to light, Extraocular movements intact,  Conjunctiva without redness, discharge or icterus. CV: RRR no murmur, no edema, no carotid bruits. No jvd.  Chest: CTAB, no wheeze or crackles. Good air movement, normal resp effort.  Abd: Soft. round. NTND. BS present. no Masses palpated. No rebound or guarding. Skin: WWW. Skin intact.  Neuro: Normal gait. PERLA. EOMi. Alert. Oriented x3  Psych: Normal affect, dress and demeanor. Normal speech. Normal thought content and judgment. EKG: NSR. No ST changes. No changes compared to prior ekg.   Assessment/Plan: [name] is a 55 y.o. female present for acute OV for chest pain/epigastric pain: Chest pain, unspecified type/epigastric pain:  - EKG 12-Lead: normal, unchanged - Troponin I - D-Dimer, Quantitative - Lipase - Comp Met (CMET) - TSH - continue ASA - pt was on thyroid medication until May from another provider. Prior labs in records indicate high cholesterol with LDL > 180, pt is not on statin therapy.  - DG Chest 2 View; Future--> normal.  - uncertain etiology of discomfort. EKG  and CXR normal. Labs collected. Echo 2015 normal. Fhx of stroke. Pt would benefit from a statin if cholesterol still elevated, will need fasting labs. Pt advised if pain occurs again to seek immediate attention in the ED.  - F/U 1 week.   Greater than 40 minutes spent with patient, >50% of time spent face to face counseling patient and coordinating care.    electronically signed by:  Howard Pouch, DO  [place]

## 2016-02-27 NOTE — Patient Instructions (Signed)
Please have your chest xray completed today.  I will call you with results once available.  If symptoms occur again, please go to ED immediately.     Angina Pectoris Angina pectoris, often called angina, is extreme discomfort in the chest, neck, or arm. This is caused by a lack of blood in the middle and thickest layer of the heart wall (myocardium). There are four types of angina:  Stable angina. Stable angina usually occurs in episodes of predictable frequency and duration. It is usually brought on by physical activity, stress, or excitement. Stable angina usually lasts a few minutes and can often be relieved by a medicine that you place under your tongue. This medicine is called sublingual nitroglycerin.  Unstable angina. Unstable angina can occur even when you are doing little or no physical activity. It can even occur while you are sleeping or when you are at rest. It can suddenly increase in severity or frequency. It may not be relieved by sublingual nitroglycerin, and it can last up to 30 minutes.  Microvascular angina. This type of angina is caused by a disorder of tiny blood vessels called arterioles. Microvascular angina is more common in women. The pain may be more severe and last longer than other types of angina pectoris.  Prinzmetal or variant angina. This type of angina pectoris is rare and usually occurs when you are doing little or no physical activity. It especially occurs in the early morning hours. CAUSES Atherosclerosis is the cause of angina. This is the buildup of fat and cholesterol (plaque) on the inside of the arteries. Over time, the plaque may narrow or block the artery, and this will lessen blood flow to the heart. Plaque can also become weak and break off within a coronary artery to form a clot and cause a sudden blockage. RISK FACTORS Risk factors common to both men and women include:  High cholesterol levels.  High blood pressure (hypertension).  Tobacco  use.  Diabetes.  Family history of angina.  Obesity.  Lack of exercise.  A diet high in saturated fats. Women are at greater risk for angina if they are:  Over age 24.  Postmenopausal. SYMPTOMS Many people do not experience any symptoms during the early stages of angina. As the condition progresses, symptoms common to both men and women may include:  Chest pain.  The pain can be described as a crushing or squeezing in the chest, or a tightness, pressure, fullness, or heaviness in the chest.  The pain can last more than a few minutes, or it can stop and recur.  Pain in the arms, neck, jaw, or back.  Unexplained heartburn or indigestion.  Shortness of breath.  Nausea.  Sudden cold sweats.  Sudden light-headedness. Many women have chest discomfort and some of the other symptoms. However, women often have different (atypical) symptoms, such as:   Fatigue.  Unexplained feelings of nervousness or anxiety.  Unexplained weakness.  Dizziness or fainting. Sometimes, women may have angina without any symptoms. DIAGNOSIS  Tests to diagnose angina may include:  ECG (electrocardiogram).  Exercise stress test. This looks for signs of blockage when the heart is being exercised.  Pharmacologic stress test. This test looks for signs of blockage when the heart is being stressed with a medicine.  Blood tests.  Coronary angiogram. This is a procedure to look at the coronary arteries to see if there is any blockage. TREATMENT  The treatment of angina may include the following:  Healthy behavioral changes to reduce  or control risk factors.  Medicine.  Coronary stenting.A stent helps to keep an artery open.  Coronary angioplasty. This procedure widens a narrowed or blocked artery.  Coronary arterybypass surgery. This will allow your blood to pass the blockage (bypass) to reach your heart. HOME CARE INSTRUCTIONS   Take medicines only as directed by your health care  provider.  Do not take the following medicines unless your health care provider approves:  Nonsteroidal anti-inflammatory drugs (NSAIDs), such as ibuprofen, naproxen, or celecoxib.  Vitamin supplements that contain vitamin A, vitamin E, or both.  Hormone replacement therapy that contains estrogen with or without progestin.  Manage other health conditions such as hypertension and diabetes as directed by your health care provider.  Follow a heart-healthy diet. A dietitian can help to educate you about healthy food options and changes.  Use healthy cooking methods such as roasting, grilling, broiling, baking, poaching, steaming, or stir-frying. Talk to a dietitian to learn more about healthy cooking methods.  Follow an exercise program approved by your health care provider.  Maintain a healthy weight. Lose weight as approved by your health care provider.  Plan rest periods when fatigued.  Learn to manage stress.  Do not use any tobacco products, including cigarettes, chewing tobacco, or electronic cigarettes. If you need help quitting, ask your health care provider.  If you drink alcohol, and your health care provider approves, limit your alcohol intake to no more than 1 drink per day. One drink equals 12 ounces of beer, 5 ounces of wine, or 1 ounces of hard liquor.  Stop illegal drug use.  Keep all follow-up visits as directed by your health care provider. This is important. SEEK IMMEDIATE MEDICAL CARE IF:   You have pain in your chest, neck, arm, jaw, stomach, or back that lasts more than a few minutes, is recurring, or is unrelieved by taking sublingualnitroglycerin.  You have profuse sweating without cause.  You have unexplained:  Heartburn or indigestion.  Shortness of breath or difficulty breathing.  Nausea or vomiting.  Fatigue.  Feelings of nervousness or anxiety.  Weakness.  Diarrhea.  You have sudden light-headedness or dizziness.  You faint. These  symptoms may represent a serious problem that is an emergency. Do not wait to see if the symptoms will go away. Get medical help right away. Call your local emergency services (911 in the [place] Do not drive yourself to the hospital.   This information is not intended to replace advice given to you by your health care provider. Make sure you discuss any questions you have with your health care provider.   Document Released: 05/11/2005 Document Revised: 06/01/2014 Document Reviewed: 09/12/2013 Elsevier Interactive Patient Education [employer].

## 2016-02-27 NOTE — Telephone Encounter (Signed)
Please call pt: - her cxr is normal

## 2016-02-28 ENCOUNTER — Telehealth: Payer: Self-pay | Admitting: Family Medicine

## 2016-02-28 LAB — D-DIMER, QUANTITATIVE: D-Dimer, Quant: 0.21 mcg/mL FEU (ref <0.50)

## 2016-02-28 NOTE — Telephone Encounter (Signed)
Please call pt:  All her labs are normal.  Her cxr was also normal, along with unchanged EKG. Her chest discomfort was not likely from a cardiac, clot or lung cause.   Results for orders placed or performed in visit on 02/27/16 (from the past 24 hour(s))  Troponin I     Status: None   Collection Time: 02/27/16  9:19 AM  Result Value Ref Range   TNIDX 0.01 0.00 - 0.06 ug/l  D-Dimer, Quantitative     Status: None   Collection Time: 02/27/16  9:19 AM  Result Value Ref Range   D-Dimer, Quant 0.21 <0.50 mcg/mL FEU   Narrative   Performed at:  [facility], [address]                [place], [place] [address]  Lipase     Status: None   Collection Time: 02/27/16  9:19 AM  Result Value Ref Range   Lipase 30.0 11.0 - 59.0 U/L  Comp Met (CMET)     Status: None   Collection Time: 02/27/16  9:19 AM  Result Value Ref Range   Sodium 138 135 - 145 mEq/L   Potassium 4.4 3.5 - 5.1 mEq/L   Chloride 103 96 - 112 mEq/L   CO2 23 19 - 32 mEq/L   Glucose, Bld 85 70 - 99 mg/dL   BUN 16 6 - 23 mg/dL   Creatinine, Ser 0.83 0.40 - 1.20 mg/dL   Total Bilirubin 0.6 0.2 - 1.2 mg/dL   Alkaline Phosphatase 95 39 - 117 U/L   AST 19 0 - 37 U/L   ALT 16 0 - 35 U/L   Total Protein 7.4 6.0 - 8.3 g/dL   Albumin 4.1 3.5 - 5.2 g/dL   Calcium 9.0 8.4 - 10.5 mg/dL   GFR 76.49 >60.00 mL/min  TSH     Status: None   Collection Time: 02/27/16  9:19 AM  Result Value Ref Range   TSH 1.05 0.35 - 4.50 uIU/mL

## 2016-02-28 NOTE — Telephone Encounter (Signed)
Left detailed message with lab,xray and information on patient voice mail per [facility]

## 2016-07-27 ENCOUNTER — Ambulatory Visit (INDEPENDENT_AMBULATORY_CARE_PROVIDER_SITE_OTHER): Payer: BLUE CROSS/BLUE SHIELD | Admitting: Family Medicine

## 2016-07-27 ENCOUNTER — Encounter: Payer: Self-pay | Admitting: Family Medicine

## 2016-07-27 VITALS — BP 134/84 | HR 75 | Temp 98.2°F | Resp 20 | Ht 65.0 in | Wt 184.0 lb

## 2016-07-27 DIAGNOSIS — R319 Hematuria, unspecified: Secondary | ICD-10-CM

## 2016-07-27 DIAGNOSIS — N951 Menopausal and female climacteric states: Secondary | ICD-10-CM

## 2016-07-27 LAB — BASIC METABOLIC PANEL WITH GFR
BUN: 12 mg/dL (ref 7–25)
CALCIUM: 9.8 mg/dL (ref 8.6–10.4)
CHLORIDE: 105 mmol/L (ref 98–110)
CO2: 28 mmol/L (ref 20–31)
CREATININE: 0.92 mg/dL (ref 0.50–1.05)
GFR, Est African American: 82 mL/min (ref >=60)
GFR, Est Non African American: 71 mL/min (ref >=60)
Glucose, Bld: 85 mg/dL (ref 65–99)
Potassium: 4.1 mmol/L (ref 3.5–5.3)
Sodium: 143 mmol/L (ref 135–146)

## 2016-07-27 NOTE — Progress Notes (Signed)
Rebecca Pollard , 08-04-1960, 56 y.o., female MRN: [id number] Patient Care Team    Relationship Specialty Notifications Start End  Ma Hillock, DO PCP - General Family Medicine  05/31/15     CC: perimenopausal symptoms.  Subjective: Pt presents for an OV with complaints of perimenopausal symptoms.  We discussed these issues about 1 year ago and she was willing to start low dose estrogen/progesterone oral medication at that time. During that time she was having moderate to severe hot flashes, insomnia, night sweats, hair loss and vaginal dryness. Risks/benefits were discussed and pt decided to only use for a few weeks. In between that time she has also seen a integrative medicine specialist and was on other formats of progesterone?, DHEA supplements and nature thyroid. Pt reports stopping all supplements for the last 2 weeks. She feels currently her hot flashes are not as bad an tolerable. Her insomnia is present, and she wished she could sleep more, but admits she gets some sleep. She still has night sweats, but they are not as bad. She endorses vaginal dryness and pain with intercourse.  She has a family h/o of breast cancer in her PGM, stroke in her father. She states her mother is still on estrogen therapy at her age and doing well. She reports a mammogram in 2016, we do not have record of this. She also states her PAP is due this year, unknown prior PAP.   No flowsheet data found.  No Known Allergies Social History  Substance Use Topics  . Smoking status: Never Smoker  . Smokeless tobacco: Never Used  . Alcohol use Yes     Comment: rare   Past Medical History:  Diagnosis Date  . Asthma   . Chronic fatigue   . Fibromyalgia   . GERD (gastroesophageal reflux disease)   . Glaucoma   . Hypertension   . IBS (irritable bowel syndrome)   . Urinary incontinence    Past Surgical History:  Procedure Laterality Date  . CHOLECYSTECTOMY     Family History  Problem Relation Age of Onset   . Hearing loss Mother   . Arthritis Father   . Hearing loss Father   . Stroke Father   . Breast cancer Paternal Grandmother   . Leukemia Maternal Uncle    Allergies as of 07/27/2016   No Known Allergies     Medication List       Accurate as of 07/27/16  3:20 PM. Always use your most recent med list.          co-enzyme Q-10 30 MG capsule Take 30 mg by mouth 3 (three) times daily.   DHEA 10 MG Caps Take 1 capsule by mouth.   Vitamin D3 2400 UNIT/ML Liqd 2,400 Units by Does not apply route daily.       No results found for this or any previous visit (from the past 24 hour(s)). No results found.   ROS: Negative, with the exception of above mentioned in HPI   Objective:  BP 134/84 (BP Location: Left Arm, Patient Position: Sitting, Cuff Size: Normal)   Pulse 75   Temp 98.2 F (36.8 C)   Resp 20   Ht 5\' 5"  (1.651 m)   Wt 184 lb (83.5 kg)   LMP 03/25/2016   SpO2 98%   BMI 30.62 kg/m  Body mass index is 30.62 kg/m. Gen: Afebrile. No acute distress. Nontoxic in appearance, well developed, well nourished.  HENT: AT. [place]. MMM Eyes:Pupils Equal Round Reactive  to light, Extraocular movements intact,  Conjunctiva without redness, discharge or icterus. Neck/lymp/endocrine: Supple,no  Lymphadenopathy, no thyromegaly.  CV: RRR no murmur, no edema Chest: CTAB, no wheeze or crackles. Good air movement, normal resp effort.  Alert. Oriented x3 Cranial nerves II through XII  Abd: Soft. NTND. BS present. no Masses palpated.  Psych: Normal affect, dress and demeanor. Normal speech. Normal thought content and judgment.  Assessment/Plan: Lalita Toh is a 56 y.o. female present OV for  Perimenopausal symptoms - discussed in length the many options for her, including  Risks and  Benefits, routine PAP and mammograms, hormones, topicals, intervaginal, SSRI  etc.  - recommend she be evaluated by gynecology with full exam and allow them to recommend therapy and discuss options.  -  Ambulatory referral to Obstetrics / Gynecology  Hematuria, unspecified type - pt brought up at end of lengthy visit, her prior concerns for blood in her urine. She stated the integrative med doctor had been testing and told her to be checked by her PCP. Only urine on file here is with 0-2 phpf.  - Urinalysis, Routine w reflex microscopic--> for reports of hematuria.  - BASIC METABOLIC PANEL WITH GFR - F/U dependent on labs.  Patient received an After-Visit Summary.  > 25 minutes spent with patient, >50% of time spent face to face counseling and/or coordinating care.     electronically signed by:  Howard Pouch, DO  [place]

## 2016-07-27 NOTE — Patient Instructions (Signed)
I will call you with results once available.  I also placed a referral to gynecology to discuss all options and have an Exam.   Perimenopause Perimenopause is the time when your body begins to move into the menopause (no menstrual period for 12 straight months). It is a natural process. Perimenopause can begin 2-8 years before the menopause and usually lasts for 1 year after the menopause. During this time, your ovaries may or may not produce an egg. The ovaries vary in their production of estrogen and progesterone hormones each month. This can cause irregular menstrual periods, difficulty getting pregnant, vaginal bleeding between periods, and uncomfortable symptoms. What are the causes?  Irregular production of the ovarian hormones, estrogen and progesterone, and not ovulating every month. Other causes include:  Tumor of the pituitary gland in the brain.  Medical disease that affects the ovaries.  Radiation treatment.  Chemotherapy.  Unknown causes.  Heavy smoking and excessive alcohol intake can bring on perimenopause sooner. What are the signs or symptoms?  Hot flashes.  Night sweats.  Irregular menstrual periods.  Decreased sex drive.  Vaginal dryness.  Headaches.  Mood swings.  Depression.  Memory problems.  Irritability.  Tiredness.  Weight gain.  Trouble getting pregnant.  The beginning of losing bone cells (osteoporosis).  The beginning of hardening of the arteries (atherosclerosis). How is this diagnosed? Your health care provider will make a diagnosis by analyzing your age, menstrual history, and symptoms. He or she will do a physical exam and note any changes in your body, especially your female organs. Female hormone tests may or may not be helpful depending on the amount of female hormones you produce and when you produce them. However, other hormone tests may be helpful to rule out other problems. How is this treated? In some cases, no treatment  is needed. The decision on whether treatment is necessary during the perimenopause should be made by you and your health care provider based on how the symptoms are affecting you and your lifestyle. Various treatments are available, such as:  Treating individual symptoms with a specific medicine for that symptom.  Herbal medicines that can help specific symptoms.  Counseling.  Group therapy. Follow these instructions at home:  Keep track of your menstrual periods (when they occur, how heavy they are, how long between periods, and how long they last) as well as your symptoms and when they started.  Only take over-the-counter or prescription medicines as directed by your health care provider.  Sleep and rest.  Exercise.  Eat a diet that contains calcium (good for your bones) and soy (acts like the estrogen hormone).  Do not smoke.  Avoid alcoholic beverages.  Take vitamin supplements as recommended by your health care provider. Taking vitamin E may help in certain cases.  Take calcium and vitamin D supplements to help prevent bone loss.  Group therapy is sometimes helpful.  Acupuncture may help in some cases. Contact a health care provider if:  You have questions about any symptoms you are having.  You need a referral to a specialist (gynecologist, psychiatrist, or psychologist). Get help right away if:  You have vaginal bleeding.  Your period lasts longer than 8 days.  Your periods are recurring sooner than 21 days.  You have bleeding after intercourse.  You have severe depression.  You have pain when you urinate.  You have severe headaches.  You have vision problems. This information is not intended to replace advice given to you by your health  care provider. Make sure you discuss any questions you have with your health care provider. Document Released: 06/18/2004 Document Revised: 10/17/2015 Document Reviewed: 12/08/2012 Elsevier Interactive Patient Education   2017 [employer].

## 2016-07-28 ENCOUNTER — Telehealth: Payer: Self-pay | Admitting: Family Medicine

## 2016-07-28 LAB — URINALYSIS, MICROSCOPIC ONLY
Bacteria, UA: NONE SEEN [HPF]
CASTS: NONE SEEN [LPF]
CRYSTALS: NONE SEEN [HPF]
YEAST: NONE SEEN [HPF]

## 2016-07-28 LAB — URINALYSIS, ROUTINE W REFLEX MICROSCOPIC
BILIRUBIN URINE: NEGATIVE
GLUCOSE, UA: NEGATIVE
KETONES UR: NEGATIVE
Leukocytes, UA: NEGATIVE
Nitrite: NEGATIVE
PROTEIN: NEGATIVE
Specific Gravity, Urine: 1.019 (ref 1.001–1.035)
pH: 6.5 (ref 5.0–8.0)

## 2016-07-28 NOTE — Telephone Encounter (Signed)
Patient notified and verbalized understanding. 

## 2016-07-28 NOTE — Telephone Encounter (Signed)
Please call pt: - her urine and kidney function is normal.

## 2016-07-30 ENCOUNTER — Encounter: Payer: Self-pay | Admitting: Family Medicine

## 2016-07-30 ENCOUNTER — Other Ambulatory Visit: Payer: Self-pay | Admitting: Family Medicine

## 2016-07-30 DIAGNOSIS — Z1231 Encounter for screening mammogram for malignant neoplasm of breast: Secondary | ICD-10-CM

## 2016-08-03 ENCOUNTER — Telehealth: Payer: Self-pay | Admitting: Family Medicine

## 2016-08-03 ENCOUNTER — Encounter: Payer: Self-pay | Admitting: Family Medicine

## 2016-08-03 DIAGNOSIS — R823 Hemoglobinuria: Secondary | ICD-10-CM

## 2016-08-03 NOTE — Telephone Encounter (Signed)
Please call pt: - the urine hgb she is witnessing on her mychart is not from blood. Her RBC is 0-2 in her urine. There is also no protein reported.  - Sometimes Hgb is reported but it is a different protein that is actually appearing, this can be caused by exercise and different type of OTC medications. Since she was on multiple different supplements, I suspect that is the cause since the rest of reports is negative.  - If still present on repeat, which can be done by lab appt only, and after all OTC supplements stopped (including NSAIDS) prior to testing, we can discuss further testing at that time.  - again the blood that was tested was for kidney function, and her kidney function is normal.

## 2016-08-04 ENCOUNTER — Encounter: Payer: Self-pay | Admitting: *Deleted

## 2016-08-04 NOTE — Telephone Encounter (Signed)
Sent message to patient in MY Chart . 

## 2016-08-26 ENCOUNTER — Ambulatory Visit: Payer: BLUE CROSS/BLUE SHIELD

## 2016-08-28 ENCOUNTER — Ambulatory Visit: Payer: Self-pay | Admitting: Obstetrics & Gynecology

## 2016-09-14 ENCOUNTER — Ambulatory Visit
Admission: RE | Admit: 2016-09-14 | Discharge: 2016-09-14 | Disposition: A | Discharge status: Home / Self Care | Payer: BLUE CROSS/BLUE SHIELD | Source: Ambulatory Visit | Attending: Family Medicine | Admitting: Family Medicine

## 2016-09-14 ENCOUNTER — Ambulatory Visit (INDEPENDENT_AMBULATORY_CARE_PROVIDER_SITE_OTHER): Payer: BLUE CROSS/BLUE SHIELD | Admitting: Obstetrics & Gynecology

## 2016-09-14 ENCOUNTER — Other Ambulatory Visit: Payer: Self-pay | Admitting: Obstetrics & Gynecology

## 2016-09-14 ENCOUNTER — Encounter: Payer: Self-pay | Admitting: Obstetrics & Gynecology

## 2016-09-14 VITALS — BP 142/94 | Ht 65.25 in | Wt 183.2 lb

## 2016-09-14 DIAGNOSIS — N911 Secondary amenorrhea: Secondary | ICD-10-CM

## 2016-09-14 DIAGNOSIS — Z1151 Encounter for screening for human papillomavirus (HPV): Secondary | ICD-10-CM | POA: Diagnosis not present

## 2016-09-14 DIAGNOSIS — R300 Dysuria: Secondary | ICD-10-CM | POA: Diagnosis not present

## 2016-09-14 DIAGNOSIS — Z01411 Encounter for gynecological examination (general) (routine) with abnormal findings: Secondary | ICD-10-CM | POA: Diagnosis not present

## 2016-09-14 DIAGNOSIS — N952 Postmenopausal atrophic vaginitis: Secondary | ICD-10-CM

## 2016-09-14 DIAGNOSIS — Z1231 Encounter for screening mammogram for malignant neoplasm of breast: Secondary | ICD-10-CM | POA: Diagnosis not present

## 2016-09-14 DIAGNOSIS — Z3049 Encounter for surveillance of other contraceptives: Secondary | ICD-10-CM | POA: Diagnosis not present

## 2016-09-14 LAB — URINALYSIS W MICROSCOPIC + REFLEX CULTURE
Bilirubin Urine: NEGATIVE
Casts: NONE SEEN [LPF]
Crystals: NONE SEEN [HPF]
Glucose, UA: NEGATIVE
KETONES UR: NEGATIVE
LEUKOCYTES UA: NEGATIVE
NITRITE: NEGATIVE
PH: 6.5 (ref 5.0–8.0)
Protein, ur: NEGATIVE
SPECIFIC GRAVITY, URINE: 1.025 (ref 1.001–1.035)
YEAST: NONE SEEN [HPF]

## 2016-09-14 LAB — TSH: TSH: 1.17 mIU/L

## 2016-09-14 NOTE — Addendum Note (Signed)
Addended by: Thurnell Garbe A on: 09/14/2016 10:33 AM   Modules accepted: Orders

## 2016-09-14 NOTE — Addendum Note (Signed)
Addended by: Thurnell Garbe A on: 09/14/2016 10:04 AM   Modules accepted: Orders

## 2016-09-14 NOTE — Progress Notes (Signed)
Rebecca Pollard 13-Jun-1960 [id number]   History:    56 y.o. G3P3 Married.  Vasectomy.  New patient presenting for annual gyn exam.  Had normal menses up to 03/2016.  No Vaginal bleeding since then.  No Pelvic Pain.  Seen by Heriberto Antigua MD and Integrative MD.  Currently on DHEA.  Tried Progesterone only Replacement previously, but d/ced because of leg pain.  Hot flashes/night sweat mild.  No depressive Sx.  Long standing anxiety.  C/O increased vaginal dryness.  No pain with IC at this time.  C/O pain with urination, no frequency.  No fever.  Breasts wnl.    Past medical history,surgical history, family history and social history were all reviewed and documented in the EPIC chart.  Gynecologic History Patient's last menstrual period was 04/16/2016 (approximate). Contraception: vasectomy Last Pap: Not up to date, normal in the past.  Last mammogram: today. Results were: Pending  Obstetric History OB History  Gravida Para Term Preterm AB Living  3 3       3   SAB TAB Ectopic Multiple Live Births               # Outcome Date GA Lbr Len/2nd Weight Sex Delivery Anes PTL Lv  3 Para           2 Para           1 Para                ROS: A ROS was performed and pertinent positives and negatives are included in the history.  GENERAL: No fevers or chills. HEENT: No change in vision, no earache, sore throat or sinus congestion. NECK: No pain or stiffness. CARDIOVASCULAR: No chest pain or pressure. No palpitations. PULMONARY: No shortness of breath, cough or wheeze. GASTROINTESTINAL: No abdominal pain, nausea, vomiting or diarrhea, melena or bright red blood per rectum. GENITOURINARY: No urinary frequency, urgency, hesitancy or dysuria. MUSCULOSKELETAL: No joint or muscle pain, no back pain, no recent trauma. DERMATOLOGIC: No rash, no itching, no lesions. ENDOCRINE: No polyuria, polydipsia, no heat or cold intolerance. No recent change in weight. HEMATOLOGICAL: No anemia or easy bruising or bleeding.  NEUROLOGIC: No headache, seizures, numbness, tingling or weakness. PSYCHIATRIC: No depression, no loss of interest in normal activity or change in sleep pattern.     Exam:   BP (!) 142/94   Ht 5' 5.25" (1.657 m)   Wt 183 lb 3.2 oz (83.1 kg)   LMP 04/16/2016 (Approximate)   BMI 30.25 kg/m   Body mass index is 30.25 kg/m.  General appearance : Well developed well nourished female. No acute distress HEENT: Eyes: no retinal hemorrhage or exudates,  Neck supple, trachea midline, no carotid bruits, no thyroidmegaly Lungs: Clear to auscultation, no rhonchi or wheezes, or rib retractions  Heart: Regular rate and rhythm, no murmurs or gallops Breast:Examined in sitting and supine position were symmetrical in appearance, no palpable masses or tenderness,  no skin retraction, no nipple inversion, no nipple discharge, no skin discoloration, no axillary or supraclavicular lymphadenopathy Abdomen: no palpable masses or tenderness, no rebound or guarding Extremities: no edema or skin discoloration or tenderness  Pelvic:  Bartholin, Urethra, Skene Glands: Within normal limits             Vagina: No gross lesions or discharge  Cervix: No gross lesions or discharge.  Pap/HPV HR done.  Uterus  AV, normal size, shape and consistency, non-tender and mobile  Adnexa  Without masses or tenderness  Anus  and perineum  normal    Assessment/Plan:  56 y.o. female for annual exam.  1. Encounter for gynecological examination with abnormal finding Normal Annual/Gyn exam except from mild Atrophic Vaginitis.  Pap/HPV HR pending.  Mammo done today, pending.  2. Dysuria U/A Blood 2+.  Pending U. Culture.  If neg, will refer to Urology to investigate Hematuria.  3. Encounter for surveillance of other contraceptive Vasectomy  4. Amenorrhea, secondary Probably entering Menopause.   - DHEA - FSH - DHEA-sulfate - Testosterone - Testosterone, free - Testosterone, % free - Sex hormone binding globulin -  TSH - Prolactin  5. Atrophic vaginitis Mild.  [place] PRN for now.  Your Annual/Gyn exam was normal today except for mild Atrophic Vaginitis.  Your urine showed Blood.  A Urine Culture was sent. If negative, we will refer you to a Urologist for investigation.  A Pap test/HPV HR is pending.  You are probably entering Menopause at this time.  Labs were done today to evaluate that.  I will see you to discuss those results and give you counseling on Menopause and Hormone Replacement Therapy at your follow up in 2 weeks.    Also, your BP was too high today, I recommend to check it at different times of the day and if above 140/80 most of the time, to follow up with your Fam. MD to adjust your treatment.  Counseling on above issues >50% x 30 min.   Princess Bruins MD, 9:01 AM 09/14/2016

## 2016-09-14 NOTE — Patient Instructions (Signed)
Your Annual/Gyn exam was normal today, but your urine showed Blood.  A Urine Culture was sent.  If negative, we will refer you to a Urologist for investigation.  A Pap test/HPV HR is pending.  You are probably entering Menopause at this time.  Labs were done today to evaluate that.  I will see you to discuss those results and give you counseling on Menopause and Hormone Replacement Therapy at your follow up in 2 weeks.  Also, your BP was too high today, I recommend to check it at different times of the day and if above 140/80 most of the time, to follow up with your Fam. MD to adjust your treatment. It was a pleasure to meet you!  See you soon.

## 2016-09-14 NOTE — Addendum Note (Signed)
Addended by: Thurnell Garbe A on: 09/14/2016 10:32 AM   Modules accepted: Orders

## 2016-09-15 LAB — DHEA-SULFATE: DHEA-SO4: 87 ug/dL (ref 8–188)

## 2016-09-15 LAB — SEX HORMONE BINDING GLOBULIN: Sex Hormone Binding: 43 nmol/L (ref 17–124)

## 2016-09-15 LAB — PROLACTIN: Prolactin: 7.1 ng/mL

## 2016-09-15 LAB — FOLLICLE STIMULATING HORMONE: FSH: 118.3 m[IU]/mL — ABNORMAL HIGH

## 2016-09-16 ENCOUNTER — Encounter: Payer: Self-pay | Admitting: Obstetrics & Gynecology

## 2016-09-16 LAB — PAP, TP IMAGING W/ HPV RNA, RFLX HPV TYPE 16,18/45: HPV MRNA, HIGH RISK: NOT DETECTED

## 2016-09-17 ENCOUNTER — Encounter: Payer: Self-pay | Admitting: Obstetrics & Gynecology

## 2016-09-17 ENCOUNTER — Other Ambulatory Visit: Payer: Self-pay | Admitting: Obstetrics & Gynecology

## 2016-09-17 LAB — DHEA: DHEA: 267 ng/dL (ref 102–1185)

## 2016-09-17 LAB — URINE CULTURE

## 2016-09-17 MED ORDER — SULFAMETHOXAZOLE-TRIMETHOPRIM 800-160 MG PO TABS: 1.0000 | ORAL_TABLET | Freq: Two times a day (BID) | ORAL | 0 refills | Status: DC

## 2016-09-18 LAB — TESTOS,TOTAL,FREE AND SHBG (FEMALE)
SEX HORMONE BINDING GLOB.: 42 nmol/L (ref 17–124)
TESTOSTERONE,FREE: 1.5 pg/mL (ref 0.1–6.4)
Testosterone,Total,LC/MS/MS: 16 ng/dL (ref 2–45)

## 2016-09-22 ENCOUNTER — Ambulatory Visit (INDEPENDENT_AMBULATORY_CARE_PROVIDER_SITE_OTHER): Payer: BLUE CROSS/BLUE SHIELD | Admitting: Obstetrics & Gynecology

## 2016-09-22 ENCOUNTER — Encounter: Payer: Self-pay | Admitting: Obstetrics & Gynecology

## 2016-09-22 VITALS — BP 126/82 | Ht 65.25 in | Wt 184.0 lb

## 2016-09-22 DIAGNOSIS — N951 Menopausal and female climacteric states: Secondary | ICD-10-CM

## 2016-09-22 DIAGNOSIS — M8580 Other specified disorders of bone density and structure, unspecified site: Secondary | ICD-10-CM

## 2016-09-22 HISTORY — DX: Other specified disorders of bone density and structure, unspecified site: M85.80

## 2016-09-22 MED ORDER — ESTRADIOL-NORETHINDRONE ACET 0.05-0.14 MG/DAY TD PTTW: 1.0000 | MEDICATED_PATCH | TRANSDERMAL | 4 refills | Status: DC

## 2016-09-22 NOTE — Progress Notes (Signed)
    Rebecca Pollard 07/30/1960 [id number]        56 y.o.  G3P3   RP:  Confirmed Sxic Menopause, discuss HRT  FSH at 118.3 on 09/14/2016.  C/O severe hot flushes and night sweats with insomnia.  Also has vaginal dryness with IC.  Worried about bone loss, no previous BD done.  Mammo 09/14/2016 neg.  No 1st degree Fam H/O Breast Ca.  No personal h/o Blood clot/stroke.    Past medical history,surgical history, problem list, medications, allergies, family history and social history were all reviewed and documented in the EPIC chart.  Directed ROS with pertinent positives and negatives documented in the history of present illness/assessment and plan.  Exam:  Vitals:   09/22/16 1051  BP: 126/82  Weight: 184 lb (83.5 kg)  Height: 5' 5.25" (1.657 m)   General appearance:  Normal   Assessment/Plan:  56 y.o. G3P3   1. Menopause syndrome Climacteric.  Benefits/Risks of HRT reviewed thoroughly including the risk of Blood clots and Breast Cancer.  Patient opts for Combipatch low dose.  Usage discussed.    - DG Bone Density; Future  Counseling on Menopause and HRT 100% x 25 min.  Princess Bruins MD, 11:35 AM 09/22/2016

## 2016-09-22 NOTE — Patient Instructions (Signed)
Menopause syndrome Climacteric.  Benefits/Risks of HRT reviewed thoroughly including the risk of Blood clots and Breast Cancer.  Patient opts for Combipatch low dose.  Usage discussed.    - DG Bone Density; Future

## 2016-09-23 ENCOUNTER — Ambulatory Visit: Payer: BLUE CROSS/BLUE SHIELD | Admitting: Obstetrics & Gynecology

## 2016-09-28 ENCOUNTER — Encounter: Payer: Self-pay | Admitting: Family Medicine

## 2016-09-28 ENCOUNTER — Other Ambulatory Visit: Payer: Self-pay | Admitting: Gynecology

## 2016-09-28 ENCOUNTER — Ambulatory Visit (INDEPENDENT_AMBULATORY_CARE_PROVIDER_SITE_OTHER): Payer: BLUE CROSS/BLUE SHIELD | Admitting: Family Medicine

## 2016-09-28 VITALS — BP 136/88 | HR 67 | Temp 97.8°F | Resp 20 | Wt 183.5 lb

## 2016-09-28 DIAGNOSIS — M2550 Pain in unspecified joint: Secondary | ICD-10-CM

## 2016-09-28 DIAGNOSIS — E559 Vitamin D deficiency, unspecified: Secondary | ICD-10-CM

## 2016-09-28 DIAGNOSIS — Z13820 Encounter for screening for osteoporosis: Secondary | ICD-10-CM

## 2016-09-28 DIAGNOSIS — Z780 Asymptomatic menopausal state: Secondary | ICD-10-CM

## 2016-09-28 LAB — RENAL FUNCTION PANEL
ALBUMIN: 4.5 g/dL (ref 3.5–5.2)
BUN: 15 mg/dL (ref 6–23)
CALCIUM: 9.8 mg/dL (ref 8.4–10.5)
CHLORIDE: 106 meq/L (ref 96–112)
CO2: 27 meq/L (ref 19–32)
Creatinine, Ser: 0.76 mg/dL (ref 0.40–1.20)
GFR: 84.49 mL/min (ref >60.00)
Glucose, Bld: 86 mg/dL (ref 70–99)
Phosphorus: 3.5 mg/dL (ref 2.3–4.6)
Potassium: 4.5 mEq/L (ref 3.5–5.1)
SODIUM: 140 meq/L (ref 135–145)

## 2016-09-28 LAB — SEDIMENTATION RATE: Sed Rate: 9 mm/hr (ref 0–30)

## 2016-09-28 LAB — VITAMIN D 25 HYDROXY (VIT D DEFICIENCY, FRACTURES): VITD: 53.48 ng/mL (ref 30.00–100.00)

## 2016-09-28 LAB — C-REACTIVE PROTEIN: CRP: 0.4 mg/dL — ABNORMAL LOW (ref 0.5–20.0)

## 2016-09-28 MED ORDER — MELOXICAM 15 MG PO TABS: 15.0000 mg | ORAL_TABLET | Freq: Every day | ORAL | 3 refills | Status: DC
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
## 2016-09-28 NOTE — Patient Instructions (Signed)
Try capsicin cream daily.  Start mobic with food.  Stretches, exercise , water aerobics.   I will call you with results of labs once all available.    Arthritis Arthritis is a term that is commonly used to refer to joint pain or joint disease. There are more than 100 types of arthritis. What are the causes? The most common cause of this condition is wear and tear of a joint. Other causes include:  Gout.  Inflammation of a joint.  An infection of a joint.  Sprains and other injuries near the joint.  A drug reaction or allergic reaction. In some cases, the cause may not be known. What are the signs or symptoms? The main symptom of this condition is pain in the joint with movement. Other symptoms include:  Redness, swelling, or stiffness at a joint.  Warmth coming from the joint.  Fever.  Overall feeling of illness. How is this diagnosed? This condition may be diagnosed with a physical exam and tests, including:  Blood tests.  Urine tests.  Imaging tests, such as MRI, X-rays, or a CT scan. Sometimes, fluid is removed from a joint for testing. How is this treated? Treatment for this condition may involve:  Treatment of the cause, if it is known.  Rest.  Raising (elevating) the joint.  Applying cold or hot packs to the joint.  Medicines to improve symptoms and reduce inflammation.  Injections of a steroid such as cortisone into the joint to help reduce pain and inflammation. Depending on the cause of your arthritis, you may need to make lifestyle changes to reduce stress on your joint. These changes may include exercising more and losing weight. Follow these instructions at home: Medicines   Take over-the-counter and prescription medicines only as told by your health care provider.  Do not take aspirin to relieve pain if gout is suspected. Activity   Rest your joint if told by your health care provider. Rest is important when your disease is active and your  joint feels painful, swollen, or stiff.  Avoid activities that make the pain worse. It is important to balance activity with rest.  Exercise your joint regularly with range-of-motion exercises as told by your health care provider. Try doing low-impact exercise, such as:  Swimming.  Water aerobics.  Biking.  Walking. Joint Care    If your joint is swollen, keep it elevated if told by your health care provider.  If your joint feels stiff in the morning, try taking a warm shower.  If directed, apply heat to the joint. If you have diabetes, do not apply heat without permission from your health care provider.  Put a towel between the joint and the hot pack or heating pad.  Leave the heat on the area for 20-30 minutes.  If directed, apply ice to the joint:  Put ice in a plastic bag.  Place a towel between your skin and the bag.  Leave the ice on for 20 minutes, 2-3 times per day.  Keep all follow-up visits as told by your health care provider. This is important. Contact a health care provider if:  The pain gets worse.  You have a fever. Get help right away if:  You develop severe joint pain, swelling, or redness.  Many joints become painful and swollen.  You develop severe back pain.  You develop severe weakness in your leg.  You cannot control your bladder or bowels. This information is not intended to replace advice given to you  by your health care provider. Make sure you discuss any questions you have with your health care provider. Document Released: 06/18/2004 Document Revised: 10/17/2015 Document Reviewed: 08/06/2014 Elsevier Interactive Patient Education  2017 [employer].

## 2016-09-29 LAB — CYCLIC CITRUL PEPTIDE ANTIBODY, IGG

## 2016-09-30 ENCOUNTER — Telehealth: Payer: Self-pay | Admitting: Family Medicine

## 2016-09-30 LAB — RHEUMATOID FACTOR: Rhuematoid fact SerPl-aCnc: <14 IU/mL (ref <14)

## 2016-09-30 NOTE — Telephone Encounter (Signed)
Spoke with patient reviewed information and instructions. Patient verbalized understanding. 

## 2016-09-30 NOTE — Telephone Encounter (Signed)
Please call patient, all of her labs are normal. No signs of rheumatoid or an inflammatory arthritis. - Increased discomfort could be fluctuation in her hormone level with recent changes made. - Encourage her to continue the meloxicam prescribed her during her appointment, and follow-up in 4 weeks.

## 2016-10-01 ENCOUNTER — Ambulatory Visit (INDEPENDENT_AMBULATORY_CARE_PROVIDER_SITE_OTHER): Payer: BLUE CROSS/BLUE SHIELD

## 2016-10-01 ENCOUNTER — Encounter: Payer: Self-pay | Admitting: Gynecology

## 2016-10-01 ENCOUNTER — Other Ambulatory Visit: Payer: Self-pay | Admitting: Gynecology

## 2016-10-01 DIAGNOSIS — M8589 Other specified disorders of bone density and structure, multiple sites: Secondary | ICD-10-CM | POA: Diagnosis not present

## 2016-10-01 DIAGNOSIS — Z13820 Encounter for screening for osteoporosis: Secondary | ICD-10-CM

## 2016-10-01 DIAGNOSIS — Z780 Asymptomatic menopausal state: Secondary | ICD-10-CM

## 2017-03-15 DIAGNOSIS — L814 Other melanin hyperpigmentation: Secondary | ICD-10-CM | POA: Diagnosis not present

## 2017-04-09 ENCOUNTER — Ambulatory Visit (INDEPENDENT_AMBULATORY_CARE_PROVIDER_SITE_OTHER): Payer: BLUE CROSS/BLUE SHIELD | Admitting: Obstetrics & Gynecology

## 2017-04-09 ENCOUNTER — Encounter: Payer: Self-pay | Admitting: Obstetrics & Gynecology

## 2017-04-09 VITALS — BP 134/80

## 2017-04-09 DIAGNOSIS — N3090 Cystitis, unspecified without hematuria: Secondary | ICD-10-CM | POA: Diagnosis not present

## 2017-04-09 DIAGNOSIS — R102 Pelvic and perineal pain: Secondary | ICD-10-CM | POA: Diagnosis not present

## 2017-04-09 MED ORDER — SULFAMETHOXAZOLE-TRIMETHOPRIM 800-160 MG PO TABS: 1.0000 | ORAL_TABLET | Freq: Two times a day (BID) | ORAL | 0 refills | Status: AC

## 2017-04-09 NOTE — Progress Notes (Signed)
    Rebecca Pollard 29-Sep-1960 [id number]        56 y.o.  G3P3  Married.  Vasectomy  RP:  Pelvic pain and discomfort persisting x >1 month  HPI:  Pelvic pain/discomfort on-off x >1 month.  Some discomfort when passing urine, but no frequency.  Not sexually active x 1 month.  Well on Combipatch started 09/2016, then added DHEAS/Pregnonolone.  No PMB.  No increase in vaginal d/c.  BMs wnl.  No fever.  Past medical history,surgical history, problem list, medications, allergies, family history and social history were all reviewed and documented in the EPIC chart.  Directed ROS with pertinent positives and negatives documented in the history of present illness/assessment and plan.  Exam:  Vitals:   04/09/17 1206  BP: 134/80   General appearance:  Normal  CVAT neg bilaterally  Abdo:  Soft, NT, not distended  Gyn exam:  Vulva normal.  Speculum:  Cervix/Vagina/Secretions normal.  No blood.  U/A Cloudy, WBC 10-20, RBC 20-40, Bacteria moderate.  Nitrites neg.  Pending U. Culture.   Assessment/Plan:  56 y.o. G3P3   1. Female pelvic pain On HRT/No PMB.  Declined STI screen.  Probably secondary to Cystitis, but will f/u Pelvic US to r/o Gynecologic pathology.   - Urinalysis w microscopic + reflex culture - US Transvaginal Non-OB; Future  2. Cystitis Treat with Bactrim.  Usage reviewed, no allergy.  Pending U. Culture.  Will repeat U/A at f/u to recheck on Hematuria, especially if U. Culture comes back negative.  Counseling on above issues >50% x 25 minutes.  Princess Bruins MD, 12:59 PM 04/09/2017

## 2017-04-10 ENCOUNTER — Encounter: Payer: Self-pay | Admitting: Obstetrics & Gynecology

## 2017-04-10 NOTE — Patient Instructions (Signed)
1. Female pelvic pain On HRT/No PMB.  Declined STI screen.  Probably secondary to Cystitis, but will f/u Pelvic US to r/o Gynecologic pathology.   - Urinalysis w microscopic + reflex culture - US Transvaginal Non-OB; Future  2. Cystitis Treat with Bactrim.  Usage reviewed, no allergy.  Pending U. Culture.  Will repeat U/A at f/u to recheck on Hematuria, especially if U. Culture comes back negative.  Dexter, it was a pleasure seeing you today.  I will inform you of your urine culture as soon as possible and see you back soon for the pelvic ultrasound.   Urinary Tract Infection, Adult A urinary tract infection (UTI) is an infection of any part of the urinary tract, which includes the kidneys, ureters, bladder, and urethra. These organs make, store, and get rid of urine in the body. UTI can be a bladder infection (cystitis) or kidney infection (pyelonephritis). What are the causes? This infection may be caused by fungi, viruses, or bacteria. Bacteria are the most common cause of UTIs. This condition can also be caused by repeated incomplete emptying of the bladder during urination. What increases the risk? This condition is more likely to develop if:  You ignore your need to urinate or hold urine for long periods of time.  You do not empty your bladder completely during urination.  You wipe back to front after urinating or having a bowel movement, if you are female.  You are uncircumcised, if you are female.  You are constipated.  You have a urinary catheter that stays in place (indwelling).  You have a weak defense (immune) system.  You have a medical condition that affects your bowels, kidneys, or bladder.  You have diabetes.  You take antibiotic medicines frequently or for long periods of time, and the antibiotics no longer work well against certain types of infections (antibiotic resistance).  You take medicines that irritate your urinary tract.  You are exposed to chemicals  that irritate your urinary tract.  You are female.  What are the signs or symptoms? Symptoms of this condition include:  Fever.  Frequent urination or passing small amounts of urine frequently.  Needing to urinate urgently.  Pain or burning with urination.  Urine that smells bad or unusual.  Cloudy urine.  Pain in the lower abdomen or back.  Trouble urinating.  Blood in the urine.  Vomiting or being less hungry than normal.  Diarrhea or abdominal pain.  Vaginal discharge, if you are female.  How is this diagnosed? This condition is diagnosed with a medical history and physical exam. You will also need to provide a urine sample to test your urine. Other tests may be done, including:  Blood tests.  Sexually transmitted disease (STD) testing.  If you have had more than one UTI, a cystoscopy or imaging studies may be done to determine the cause of the infections. How is this treated? Treatment for this condition often includes a combination of two or more of the following:  Antibiotic medicine.  Other medicines to treat less common causes of UTI.  Over-the-counter medicines to treat pain.  Drinking enough water to stay hydrated.  Follow these instructions at home:  Take over-the-counter and prescription medicines only as told by your health care provider.  If you were prescribed an antibiotic, take it as told by your health care provider. Do not stop taking the antibiotic even if you start to feel better.  Avoid alcohol, caffeine, tea, and carbonated beverages. They can irritate your bladder.  Drink enough fluid to keep your urine clear or pale yellow.  Keep all follow-up visits as told by your health care provider. This is important.  Make sure to: ? Empty your bladder often and completely. Do not hold urine for long periods of time. ? Empty your bladder before and after sex. ? Wipe from front to back after a bowel movement if you are female. Use each  tissue one time when you wipe. Contact a health care provider if:  You have back pain.  You have a fever.  You feel nauseous or vomit.  Your symptoms do not get better after 3 days.  Your symptoms go away and then return. Get help right away if:  You have severe back pain or lower abdominal pain.  You are vomiting and cannot keep down any medicines or water. This information is not intended to replace advice given to you by your health care provider. Make sure you discuss any questions you have with your health care provider. Document Released: 02/18/2005 Document Revised: 10/23/2015 Document Reviewed: 04/01/2015 Elsevier Interactive Patient Education  2017 [employer].

## 2017-04-12 LAB — URINE CULTURE
MICRO NUMBER: 81300340
SPECIMEN QUALITY:: ADEQUATE

## 2017-04-12 LAB — URINALYSIS W MICROSCOPIC + REFLEX CULTURE
BILIRUBIN URINE: NEGATIVE
Glucose, UA: NEGATIVE
Hyaline Cast: NONE SEEN /LPF
Ketones, ur: NEGATIVE
NITRITES URINE, INITIAL: NEGATIVE
PROTEIN: NEGATIVE
Specific Gravity, Urine: 1.02 (ref 1.001–1.03)
pH: 5.5 (ref 5.0–8.0)

## 2017-04-12 LAB — CULTURE INDICATED

## 2017-04-29 ENCOUNTER — Ambulatory Visit: Payer: BLUE CROSS/BLUE SHIELD | Admitting: Obstetrics & Gynecology

## 2017-04-29 ENCOUNTER — Ambulatory Visit (INDEPENDENT_AMBULATORY_CARE_PROVIDER_SITE_OTHER): Payer: BLUE CROSS/BLUE SHIELD

## 2017-04-29 ENCOUNTER — Encounter: Payer: Self-pay | Admitting: Obstetrics & Gynecology

## 2017-04-29 VITALS — BP 126/78

## 2017-04-29 DIAGNOSIS — R102 Pelvic and perineal pain: Secondary | ICD-10-CM | POA: Diagnosis not present

## 2017-04-29 DIAGNOSIS — R319 Hematuria, unspecified: Secondary | ICD-10-CM

## 2017-04-29 DIAGNOSIS — N952 Postmenopausal atrophic vaginitis: Secondary | ICD-10-CM | POA: Diagnosis not present

## 2017-04-29 MED ORDER — ESTRADIOL 0.1 MG/GM VA CREA: 0.2500 | TOPICAL_CREAM | VAGINAL | 4 refills | Status: DC

## 2017-04-29 NOTE — Progress Notes (Signed)
    Rebecca Pollard Feb 19, 1961 [id number]        56 y.o.  G3P3   RP:  Pelvic pain for Pelvic US  HPI: Treated for Cystitis November 16th, but U. Culture came back negative.  Pelvic pain/discomfort on-off x >1 month, a little better since last visit.  Some discomfort when passing urine, but no frequency.   Wants to be sexually active.  Well on Combipatch started 09/2016, then added DHEAS/Pregnonolone.  No PMB.    Still having vaginal dryness. BMs wnl.  No fever.  Past medical history,surgical history, problem list, medications, allergies, family history and social history were all reviewed and documented in the EPIC chart.  Directed ROS with pertinent positives and negatives documented in the history of present illness/assessment and plan.  Exam:  Vitals:   04/29/17 1614  BP: 126/78   General appearance:  Normal  Pelvic US today: T/V and T/A images: Anteverted uterus measuring 9.57 x 5.31 x 4.17 cm.  Endometrial lining normal at 5.2 mm.  Intramural uterine fibroids, largest 2 measuring 2.3 x 1.3 cm and 1.3 cm.  Right ovary not seen.  Left ovary atrophic.  No mass seen in the right and left adnexa.  No free fluid in the posterior cul-de-sac.   Assessment/Plan:  56 y.o. G3P3   1. Pelvic pain in female Intermittent pelvic discomfort improved.  Pelvic ultrasound reassuring with only 2 small intramural fibroids.  Endometrial lining normal on the CombiPatch.  Ultrasound results reviewed and counseling done.  Patient reassured.  2. Hematuria, unspecified type Last urine culture negative.  But persistent hematuria with 10-20 red blood cells per high-power field.  Refer to Urology - Urinalysis with Culture Reflex - Urine Culture  3. Post-menopause atrophic vaginitis CombiPatch not sufficient to treat atrophic vaginitis.  Patient is sexually active.  Requests vaginal estrogen.  Estrace cream prescribed.  Usage risks and benefits reviewed.  Counseling on above issues more than 50% for 15  minutes.  Princess Bruins MD, 4:26 PM 04/29/2017

## 2017-04-30 LAB — URINALYSIS W MICROSCOPIC + REFLEX CULTURE
BACTERIA UA: NONE SEEN /HPF
Bilirubin Urine: NEGATIVE
Glucose, UA: NEGATIVE
HYALINE CAST: NONE SEEN /LPF
Ketones, ur: NEGATIVE
LEUKOCYTE ESTERASE: NEGATIVE
NITRITES URINE, INITIAL: NEGATIVE
PROTEIN: NEGATIVE
Specific Gravity, Urine: 1.02 (ref 1.001–1.03)
WBC UA: NONE SEEN /HPF (ref 0–5)
pH: 6.5 (ref 5.0–8.0)

## 2017-04-30 LAB — URINE CULTURE
MICRO NUMBER: 81373822
Result:: NO GROWTH
SPECIMEN QUALITY: ADEQUATE

## 2017-04-30 LAB — NO CULTURE INDICATED

## 2017-05-02 ENCOUNTER — Encounter: Payer: Self-pay | Admitting: Obstetrics & Gynecology

## 2017-05-02 NOTE — Patient Instructions (Signed)
1. Pelvic pain in female Intermittent pelvic discomfort improved.  Pelvic ultrasound reassuring with only 2 small intramural fibroids.  Endometrial lining normal on the CombiPatch.  Ultrasound results reviewed and counseling done.  Patient reassured.  2. Hematuria, unspecified type Last urine culture negative.  But persistent hematuria with 10-20 red blood cells per high-power field.  Refer to Urology - Urinalysis with Culture Reflex - Urine Culture  3. Post-menopause atrophic vaginitis CombiPatch not sufficient to treat atrophic vaginitis.  Patient is sexually active.  Requests vaginal estrogen.  Estrace cream prescribed.  Usage risks and benefits reviewed.  Linus Orn, good seeing you today!

## 2017-05-04 ENCOUNTER — Encounter: Payer: Self-pay | Admitting: Obstetrics & Gynecology

## 2017-05-05 ENCOUNTER — Telehealth: Payer: Self-pay | Admitting: *Deleted

## 2017-05-05 NOTE — Telephone Encounter (Signed)
Office notes faxed to [facility] they will contact time and date.

## 2017-05-05 NOTE — Telephone Encounter (Signed)
-----   Message from Princess Bruins, MD sent at 04/29/2017  4:29 PM EST ----- Regarding: Hematuria Refer to Urology for investigation of significant Hematuria.  Also R/O Interstitial Cystitis.

## 2017-05-24 NOTE — Telephone Encounter (Signed)
Urology has left message for patient to call.

## 2017-05-25 DIAGNOSIS — I10 Essential (primary) hypertension: Secondary | ICD-10-CM

## 2017-05-25 HISTORY — DX: Essential (primary) hypertension: I10

## 2017-08-10 ENCOUNTER — Ambulatory Visit (INDEPENDENT_AMBULATORY_CARE_PROVIDER_SITE_OTHER): Payer: BLUE CROSS/BLUE SHIELD

## 2017-08-10 ENCOUNTER — Encounter: Payer: Self-pay | Admitting: Obstetrics & Gynecology

## 2017-08-10 ENCOUNTER — Ambulatory Visit: Payer: BLUE CROSS/BLUE SHIELD | Admitting: Obstetrics & Gynecology

## 2017-08-10 VITALS — BP 132/72

## 2017-08-10 DIAGNOSIS — N950 Postmenopausal bleeding: Secondary | ICD-10-CM

## 2017-08-10 DIAGNOSIS — R319 Hematuria, unspecified: Secondary | ICD-10-CM | POA: Diagnosis not present

## 2017-08-10 DIAGNOSIS — R102 Pelvic and perineal pain: Secondary | ICD-10-CM

## 2017-08-10 NOTE — Progress Notes (Addendum)
    Rebecca Pollard 29-Nov-1960 [id number]        57 y.o.  G3P3L3 Married.  Vasectomy  RP: PMB x 2 weeks end of January 2019  HPI: Menopause on HRT with Combipatch.  Had light PMB x 2 weeks at the end of January 2019, none since then.  No Pelvic pain.  No UTI Sx, no blood in urine.  BMs wnl, no rectal bleeding.   OB History  Gravida Para Term Preterm AB Living  3 3       3   SAB TAB Ectopic Multiple Live Births               # Outcome Date GA Lbr Len/2nd Weight Sex Delivery Anes PTL Lv  3 Para           2 Para           1 Para               Past medical history,surgical history, problem list, medications, allergies, family history and social history were all reviewed and documented in the EPIC chart.   Directed ROS with pertinent positives and negatives documented in the history of present illness/assessment and plan.  Exam:  Vitals:   08/10/17 1134  BP: 132/72   General appearance:  Normal  Abdomen: Soft, NT  Gynecologic exam: Vulva normal.  Speculum:  Cervix normal, no lesion, no polyp.  Vagina normal.  Normal secretions.  Bimanual exam:  Uterus AV, normal size, mobile, NT.  No adnexal mass felt, NT.  U/A: Yellow, clear, nitrites negative, white blood cells none, red blood cells 3-10, bacteria few.  Urine culture pending.  Pelvic US today: T/V images.  Uterus anteverted heterogeneous measuring 9.38 x 5.31 x 3.98 cm.  Endometrial lining thin and normal at 3.7 mm.  Intramural fibroids measuring 1.6 x 1.6 cm at the right posterior aspect of the uterus and a left intramural fibroid measuring 2.2 x 2.0 cm.  Right ovary normal seen against the wall of the uterus.  Left ovary with a thin walled echo-free cyst measuring 1.7 x 1.2 cm.  No adnexal mass seen.  No free fluid in the posterior cul-de-sac.   Assessment/Plan:  57 y.o. G3P3   1. Postmenopausal bleeding Resolved postmenopausal bleeding, could have been due to to an isolated ovulation.  Normal endometrial lining.  Patient  reassured that no endometrial pathology is present.  Will observe. - US Transvaginal Non-OB  2. Hematuria, unspecified type Long-standing hematuria per patient.  3-10 red blood cells by high power fields on today's urine.  Decision to refer to Urlology for further investigation.  Pending urine culture today. - Urinalysis,Complete w/RFL Culture  3. Pelvic pain in female Possible pain associated with a small functional simple cyst on the left ovary.  Benign appearance, will observe.   - US Transvaginal Non-OB - Urinalysis,Complete w/RFL Culture  Counseling and coordination of care more than 50% of 15 minutes.  Princess Bruins MD, 11:36 AM 08/10/2017

## 2017-08-11 ENCOUNTER — Telehealth: Payer: Self-pay | Admitting: *Deleted

## 2017-08-11 ENCOUNTER — Encounter: Payer: Self-pay | Admitting: *Deleted

## 2017-08-11 NOTE — Telephone Encounter (Signed)
Pt scheduled 09/06/17 @ 1:45pm with Dr. Pilar Jarvis sent my chart message.

## 2017-08-11 NOTE — Telephone Encounter (Signed)
-----   Message from Princess Bruins, MD sent at 08/10/2017 12:41 PM EDT ----- Regarding: Refer to Urology Persistent microscopic hematuria with pelvic pain.  Previously referred to Uro, ready to go now.

## 2017-08-12 LAB — URINALYSIS, COMPLETE W/RFL CULTURE
Bilirubin Urine: NEGATIVE
Glucose, UA: NEGATIVE
Hyaline Cast: NONE SEEN /LPF
Ketones, ur: NEGATIVE
Leukocyte Esterase: NEGATIVE
Nitrites, Initial: NEGATIVE
Protein, ur: NEGATIVE
Specific Gravity, Urine: 1.01 (ref 1.001–1.03)
WBC, UA: NONE SEEN /HPF (ref 0–5)
pH: 5.5 (ref 5.0–8.0)

## 2017-08-12 LAB — URINE CULTURE
MICRO NUMBER:: 90350669
RESULT: NO GROWTH
SPECIMEN QUALITY:: ADEQUATE

## 2017-08-12 LAB — CULTURE INDICATED

## 2017-08-13 ENCOUNTER — Encounter: Payer: Self-pay | Admitting: *Deleted

## 2017-08-15 ENCOUNTER — Encounter: Payer: Self-pay | Admitting: Obstetrics & Gynecology

## 2017-08-15 NOTE — Patient Instructions (Addendum)
1. Postmenopausal bleeding Resolved postmenopausal bleeding, could have been due to to an isolated ovulation.  Normal endometrial lining.  Patient reassured that no endometrial pathology is present.  Will observe. - US Transvaginal Non-OB  2. Hematuria, unspecified type Long-standing Monocryl hematuria per patient.  3-10 red blood cells by high power fields on today's urine.  Decision to refer to Urlology for further investigation.  Pending urine culture today. - Urinalysis,Complete w/RFL Culture  3. Pelvic pain in female Possible pain associated with a small functional simple cyst on the left ovary.  Benign appearance, will observe.   - US Transvaginal Non-OB - Urinalysis,Complete w/RFL Culture  Shaya, good seeing you today!

## 2017-08-17 NOTE — Telephone Encounter (Signed)
Left message for pt to call, pt has not seen my chart message.

## 2017-08-18 NOTE — Telephone Encounter (Signed)
Pt received mychart message.

## 2017-09-06 DIAGNOSIS — R3121 Asymptomatic microscopic hematuria: Secondary | ICD-10-CM | POA: Diagnosis not present

## 2017-09-06 DIAGNOSIS — R102 Pelvic and perineal pain: Secondary | ICD-10-CM | POA: Diagnosis not present

## 2017-09-06 DIAGNOSIS — N3281 Overactive bladder: Secondary | ICD-10-CM | POA: Diagnosis not present

## 2017-09-14 DIAGNOSIS — R3121 Asymptomatic microscopic hematuria: Secondary | ICD-10-CM | POA: Diagnosis not present

## 2017-09-14 DIAGNOSIS — R3129 Other microscopic hematuria: Secondary | ICD-10-CM | POA: Diagnosis not present

## 2017-09-17 ENCOUNTER — Ambulatory Visit: Payer: BLUE CROSS/BLUE SHIELD | Admitting: Family Medicine

## 2017-09-17 ENCOUNTER — Encounter: Payer: Self-pay | Admitting: Family Medicine

## 2017-09-17 VITALS — BP 113/83 | HR 76 | Temp 98.2°F | Resp 20 | Ht 65.0 in | Wt 186.5 lb

## 2017-09-17 DIAGNOSIS — R300 Dysuria: Secondary | ICD-10-CM | POA: Diagnosis not present

## 2017-09-17 DIAGNOSIS — R8290 Unspecified abnormal findings in urine: Secondary | ICD-10-CM

## 2017-09-17 LAB — POC URINALSYSI DIPSTICK (AUTOMATED)
BILIRUBIN UA: NEGATIVE
Glucose, UA: NEGATIVE
KETONES UA: NEGATIVE
LEUKOCYTES UA: NEGATIVE
NITRITE UA: NEGATIVE
Spec Grav, UA: >=1.03 — AB (ref 1.010–1.025)
Urobilinogen, UA: 0.2 E.U./dL
pH, UA: 6 (ref 5.0–8.0)

## 2017-09-17 MED ORDER — CEPHALEXIN 500 MG PO CAPS: 500.0000 mg | ORAL_CAPSULE | Freq: Four times a day (QID) | ORAL | 0 refills | Status: DC

## 2017-09-17 NOTE — Progress Notes (Signed)
Rebecca Pollard , 1960/10/11, 57 y.o., female MRN: [id number] Patient Care Team    Relationship Specialty Notifications Start End  Ma Hillock, DO PCP - General Family Medicine  05/31/15     Chief Complaint  Patient presents with  . Urinary Frequency    started yesterday     Subjective: Pt presents for an OV with complaints of frequency and incomplete bladder empty  of started yesterday. She denies fever, chills, nausea, vomit. She is established with urology now and recently started on myrbetriq.  SHe has a history of chronic hematuria.  Associated symptoms include foul odor to her urine for 1 week.  UX at GYN 08/10/2017--> normal.   Depression screen PHQ 2/9 09/17/2017  Decreased Interest 0  Down, Depressed, Hopeless 0  PHQ - 2 Score 0    No Known Allergies Social History   Tobacco Use  . Smoking status: Never Smoker  . Smokeless tobacco: Never Used  Substance Use Topics  . Alcohol use: Yes    Comment: rare   Past Medical History:  Diagnosis Date  . Asthma   . Chronic fatigue   . Fibromyalgia   . GERD (gastroesophageal reflux disease)   . Glaucoma   . Hypertension   . IBS (irritable bowel syndrome)   . Osteopenia 09/2016   T score -1.3 FRAX 5.3%/0.3%  . Urinary incontinence    Past Surgical History:  Procedure Laterality Date  . CHOLECYSTECTOMY     Family History  Problem Relation Age of Onset  . Hearing loss Mother   . Arthritis Father   . Hearing loss Father   . Stroke Father   . Breast cancer Paternal Grandmother   . Leukemia Maternal Uncle    Allergies as of 09/17/2017   No Known Allergies     Medication List        Accurate as of 09/17/17  5:28 PM. Always use your most recent med list.          albuterol 108 (90 Base) MCG/ACT inhaler Commonly known as:  PROVENTIL HFA;VENTOLIN HFA Inhale 1-2 puffs into the lungs every 6 (six) hours as needed for wheezing or shortness of breath.   cephALEXin 500 MG capsule Commonly known as:   KEFLEX Take 1 capsule (500 mg total) by mouth 4 (four) times daily.   co-enzyme Q-10 30 MG capsule Take 30 mg by mouth 3 (three) times daily.   DHEA 10 MG Tabs Take by mouth.   DHEA PO Take by mouth.   estradiol 0.1 MG/GM vaginal cream Commonly known as:  ESTRACE VAGINAL Place 1.54 Applicatorfuls vaginally 2 (two) times a week.   estradiol-norethindrone 0.05-0.14 MG/DAY Commonly known as:  COMBIPATCH Place 1 patch onto the skin 2 (two) times a week.   multivitamin with minerals tablet Take 1 tablet by mouth daily.   MYRBETRIQ 25 MG Tb24 tablet Generic drug:  mirabegron ER Take 1 tablet by mouth daily.   vitamin B-12 100 MCG tablet Commonly known as:  CYANOCOBALAMIN Take 100 mcg by mouth daily.   Vitamin D3 2400 UNIT/ML Liqd 2,400 Units by Does not apply route daily.       All past medical history, surgical history, allergies, family history, immunizations andmedications were updated in the EMR today and reviewed under the history and medication portions of their EMR.     ROS: Negative, with the exception of above mentioned in HPI   Objective:  BP 113/83 (BP Location: Right Arm, Patient Position: Sitting, Cuff Size: Large)  Pulse 76   Temp 98.2 F (36.8 C)   Resp 20   Ht 5\' 5"  (1.651 m)   Wt 186 lb 8 oz (84.6 kg)   LMP 04/16/2016 (Approximate)   SpO2 97%   BMI 31.04 kg/m  Body mass index is 31.04 kg/m. Gen: Afebrile. No acute distress. Nontoxic in appearance, well developed, well nourished.  HENT: AT. [place]. MMM, no oral lesions. Eyes:Pupils Equal Round Reactive to light, Extraocular movements intact,  Conjunctiva without redness, discharge or icterus. CV: RRR  Chest: CTAB, no wheeze or crackles.  Abd: Soft. NTND. BS present.  No masses palpated. No rebound or guarding.  MSK: No CVA tenderness Neuro:  Normal gait. PERLA. EOMi. Alert. Oriented x3  No exam data present No results found. Results for orders placed or performed in visit on 09/17/17 (from the  past 24 hour(s))  POCT Urinalysis Dipstick (Automated)     Status: Abnormal   Collection Time: 09/17/17  3:24 PM  Result Value Ref Range   Color, UA yellow    Clarity, UA clear    Glucose, UA neg    Bilirubin, UA neg    Ketones, UA neg    Spec Grav, UA >=1.030 (A) 1.010 - 1.025   Blood, UA 2+    pH, UA 6.0 5.0 - 8.0   Protein, UA positve    Urobilinogen, UA 0.2 0.2 or 1.0 E.U./dL   Nitrite, UA neg    Leukocytes, UA Negative Negative    Assessment/Plan: Rebecca Pollard is a 57 y.o. female present for OV for  Abnormal urine/Abnormal urine odor - rest, HYDRATE. - cephALEXin (KEFLEX) 500 MG capsule; Take 1 capsule (500 mg total) by mouth 4 (four) times daily.  Dispense: 28 capsule; Refill: 0 - POCT Urinalysis Dipstick (Automated) - Urine Culture sent.  Treat prophylactically, patient will be called once urine culture resulted. -List of common bladder irritants to review to see as potential cause if not infectious. - F/U PRN   Reviewed expectations re: course of current medical issues.  Discussed self-management of symptoms.  Outlined signs and symptoms indicating need for more acute intervention.  Patient verbalized understanding and all questions were answered.  Patient received an After-Visit Summary.    Orders Placed This Encounter  Procedures  . Urine Culture  . POCT Urinalysis Dipstick (Automated)     Note is dictated utilizing voice recognition software. Although note has been proof read prior to signing, occasional typographical errors still can be missed. If any questions arise, please do not hesitate to call for verification.   electronically signed by:  Howard Pouch, DO  [place]

## 2017-09-17 NOTE — Patient Instructions (Signed)
-   rest, HYDRATE. - cephALEXin (KEFLEX) 500 MG capsule; Take 1 capsule (500 mg total) by mouth 4 (four) times daily.  Dispense: 28 capsule; Refill: 0 - Urine Culture sent. We will call you with results once available.  Until then start antibiotic and take until completed.

## 2017-09-18 LAB — URINE CULTURE
MICRO NUMBER:: 90513105
SPECIMEN QUALITY: ADEQUATE

## 2017-10-25 DIAGNOSIS — N3281 Overactive bladder: Secondary | ICD-10-CM | POA: Diagnosis not present

## 2017-10-25 DIAGNOSIS — R102 Pelvic and perineal pain: Secondary | ICD-10-CM | POA: Diagnosis not present

## 2017-10-25 DIAGNOSIS — R3121 Asymptomatic microscopic hematuria: Secondary | ICD-10-CM | POA: Diagnosis not present

## 2017-11-01 DIAGNOSIS — M62838 Other muscle spasm: Secondary | ICD-10-CM | POA: Diagnosis not present

## 2017-11-01 DIAGNOSIS — M6281 Muscle weakness (generalized): Secondary | ICD-10-CM | POA: Diagnosis not present

## 2017-11-01 DIAGNOSIS — R102 Pelvic and perineal pain: Secondary | ICD-10-CM | POA: Diagnosis not present

## 2017-11-01 DIAGNOSIS — N3281 Overactive bladder: Secondary | ICD-10-CM | POA: Diagnosis not present

## 2017-11-08 DIAGNOSIS — N3281 Overactive bladder: Secondary | ICD-10-CM | POA: Diagnosis not present

## 2017-11-08 DIAGNOSIS — M6281 Muscle weakness (generalized): Secondary | ICD-10-CM | POA: Diagnosis not present

## 2017-11-08 DIAGNOSIS — R102 Pelvic and perineal pain: Secondary | ICD-10-CM | POA: Diagnosis not present

## 2017-11-08 DIAGNOSIS — M62838 Other muscle spasm: Secondary | ICD-10-CM | POA: Diagnosis not present

## 2017-11-15 DIAGNOSIS — N3281 Overactive bladder: Secondary | ICD-10-CM | POA: Diagnosis not present

## 2017-11-15 DIAGNOSIS — M62838 Other muscle spasm: Secondary | ICD-10-CM | POA: Diagnosis not present

## 2017-11-15 DIAGNOSIS — M6281 Muscle weakness (generalized): Secondary | ICD-10-CM | POA: Diagnosis not present

## 2017-11-15 DIAGNOSIS — R102 Pelvic and perineal pain: Secondary | ICD-10-CM | POA: Diagnosis not present

## 2017-11-16 ENCOUNTER — Encounter: Payer: Self-pay | Admitting: Obstetrics & Gynecology

## 2017-11-16 ENCOUNTER — Ambulatory Visit (INDEPENDENT_AMBULATORY_CARE_PROVIDER_SITE_OTHER): Payer: BLUE CROSS/BLUE SHIELD | Admitting: Obstetrics & Gynecology

## 2017-11-16 VITALS — BP 126/82 | Ht 64.75 in | Wt 190.0 lb

## 2017-11-16 DIAGNOSIS — Z01419 Encounter for gynecological examination (general) (routine) without abnormal findings: Secondary | ICD-10-CM | POA: Diagnosis not present

## 2017-11-16 DIAGNOSIS — E6609 Other obesity due to excess calories: Secondary | ICD-10-CM

## 2017-11-16 DIAGNOSIS — Z6831 Body mass index (BMI) 31.0-31.9, adult: Secondary | ICD-10-CM

## 2017-11-16 DIAGNOSIS — Z79890 Hormone replacement therapy (postmenopausal): Secondary | ICD-10-CM

## 2017-11-16 MED ORDER — ESTRADIOL 0.1 MG/GM VA CREA: 0.2500 | TOPICAL_CREAM | VAGINAL | 4 refills | Status: DC

## 2017-11-16 MED ORDER — ESTRADIOL-NORETHINDRONE ACET 0.05-0.14 MG/DAY TD PTTW: 1.0000 | MEDICATED_PATCH | TRANSDERMAL | 4 refills | Status: DC

## 2017-11-16 NOTE — Patient Instructions (Signed)
1. Well female exam with routine gynecological exam Normal gynecologic exam.  Pap test was negative with negative high-risk HPV in April 2019.  Will repeat Pap test at 2 to 3 years.  Breast exam normal.  Overdue for screening mammogram, will schedule now.  Health labs with family physician.  No screening colonoscopy so far, message sent to organize.  2. Post-menopause on HRT (hormone replacement therapy) Well on hormone replacement therapy with CombiPatch.  Controlling the vasomotor symptoms.  Had mild postmenopausal bleeding January 2019 with a negative investigation, pelvic ultrasound showing a thin endometrial lining at 3.7 mm in March 2019.  No contraindication to CombiPatch.  CombiPatch represcribed.  Patient is also taking a supplement of DHEA.  Will check her level today. - DHEA-sulfate  3. Class 1 obesity due to excess calories without serious comorbidity with body mass index (BMI) of 31.0 to 31.9 in adult Recommend lower calorie/carb diet, for example [employer].  Physical activity with aerobic activities 5 times a week and weightlifting every 2 days.  Other orders - estradiol-norethindrone (COMBIPATCH) 0.05-0.14 MG/DAY; Place 1 patch onto the skin 2 (two) times a week. - estradiol (ESTRACE VAGINAL) 0.1 MG/GM vaginal cream; Place 3.96 Applicatorfuls vaginally 2 (two) times a week.  Jameshia, it was a pleasure seeing you today!  I will inform you of your results as soon as they are available.

## 2017-11-16 NOTE — Progress Notes (Signed)
Rebecca Pollard [birth year] [id number]   History:    57 y.o. G3P3L3 Married.  RP:  Established patient presenting for annual gyn exam   HPI: Menopause, well on Combipatch.  No PMB x 05/2017, Endometrial line thin at 3.7 mm in 07/2017.  No pelvic pain.  No pain with IC, using Estradiol cream 2x per week.  Urine/BMs wnl.  Breasts wnl.  Will schedule screening Mammo now.  Health labs with Fam MD.  Past medical history,surgical history, family history and social history were all reviewed and documented in the EPIC chart.  Gynecologic History Patient's last menstrual period was 04/16/2016 (approximate). Contraception: post menopausal status Last Pap: 08/2016. Results were: Negative, HPV HR neg Last mammogram: 08/2016. Results were: Negative Bone Density: 09/2016 Osteopenia T-Score -1.3 at Rt Femoral Neck Colonoscopy: Never, will organize now.  Obstetric History OB History  Gravida Para Term Preterm AB Living  3 3       3   SAB TAB Ectopic Multiple Live Births               # Outcome Date GA Lbr Len/2nd Weight Sex Delivery Anes PTL Lv  3 Para           2 Para           1 Para              ROS: A ROS was performed and pertinent positives and negatives are included in the history.  GENERAL: No fevers or chills. HEENT: No change in vision, no earache, sore throat or sinus congestion. NECK: No pain or stiffness. CARDIOVASCULAR: No chest pain or pressure. No palpitations. PULMONARY: No shortness of breath, cough or wheeze. GASTROINTESTINAL: No abdominal pain, nausea, vomiting or diarrhea, melena or bright red blood per rectum. GENITOURINARY: No urinary frequency, urgency, hesitancy or dysuria. MUSCULOSKELETAL: No joint or muscle pain, no back pain, no recent trauma. DERMATOLOGIC: No rash, no itching, no lesions. ENDOCRINE: No polyuria, polydipsia, no heat or cold intolerance. No recent change in weight. HEMATOLOGICAL: No anemia or easy bruising or bleeding. NEUROLOGIC: No headache, seizures,  numbness, tingling or weakness. PSYCHIATRIC: No depression, no loss of interest in normal activity or change in sleep pattern.     Exam:   BP 126/82   Ht 5' 4.75" (1.645 m)   Wt 190 lb (86.2 kg)   LMP 04/16/2016 (Approximate)   BMI 31.86 kg/m   Body mass index is 31.86 kg/m.  General appearance : Well developed well nourished female. No acute distress HEENT: Eyes: no retinal hemorrhage or exudates,  Neck supple, trachea midline, no carotid bruits, no thyroidmegaly Lungs: Clear to auscultation, no rhonchi or wheezes, or rib retractions  Heart: Regular rate and rhythm, no murmurs or gallops Breast:Examined in sitting and supine position were symmetrical in appearance, no palpable masses or tenderness,  no skin retraction, no nipple inversion, no nipple discharge, no skin discoloration, no axillary or supraclavicular lymphadenopathy Abdomen: no palpable masses or tenderness, no rebound or guarding Extremities: no edema or skin discoloration or tenderness  Pelvic: Vulva: Normal             Vagina: No gross lesions or discharge  Cervix: No gross lesions or discharge  Uterus  AV, normal size, shape and consistency, non-tender and mobile  Adnexa  Without masses or tenderness  Anus: Normal   Assessment/Plan:  57 y.o. female for annual exam   1. Well female exam with routine gynecological exam Normal gynecologic exam.  Pap test was negative  with negative high-risk HPV in April 2019.  Will repeat Pap test at 2 to 3 years.  Breast exam normal.  Overdue for screening mammogram, will schedule now.  Health labs with family physician.  No screening colonoscopy so far, message sent to organize.  2. Post-menopause on HRT (hormone replacement therapy) Well on hormone replacement therapy with CombiPatch.  Controlling the vasomotor symptoms.  Had mild postmenopausal bleeding January 2019 with a negative investigation, pelvic ultrasound showing a thin endometrial lining at 3.7 mm in March 2019.  No  contraindication to CombiPatch.  CombiPatch represcribed.  Patient is also taking a supplement of DHEA.  Will check her level today. - DHEA-sulfate  3. Class 1 obesity due to excess calories without serious comorbidity with body mass index (BMI) of 31.0 to 31.9 in adult Recommend lower calorie/carb diet, for example [employer].  Physical activity with aerobic activities 5 times a week and weightlifting every 2 days.  Other orders - estradiol-norethindrone (COMBIPATCH) 0.05-0.14 MG/DAY; Place 1 patch onto the skin 2 (two) times a week. - estradiol (ESTRACE VAGINAL) 0.1 MG/GM vaginal cream; Place 7.02 Applicatorfuls vaginally 2 (two) times a week.  Princess Bruins MD, 4:34 PM 11/16/2017

## 2017-11-17 ENCOUNTER — Telehealth: Payer: Self-pay | Admitting: *Deleted

## 2017-11-17 DIAGNOSIS — Z1211 Encounter for screening for malignant neoplasm of colon: Secondary | ICD-10-CM

## 2017-11-17 LAB — DHEA-SULFATE: DHEA-SO4: 209 ug/dL — ABNORMAL HIGH (ref 8–188)

## 2017-11-17 NOTE — Telephone Encounter (Signed)
-----   Message from Princess Bruins, MD sent at 11/16/2017  4:57 PM EDT ----- Regarding: Refer to Gastro-Enterology Screening Colonoscopy

## 2017-11-17 NOTE — Telephone Encounter (Signed)
Referral placed for [facility] they will call to schedule.

## 2017-11-18 ENCOUNTER — Encounter: Payer: Self-pay | Admitting: *Deleted

## 2017-11-18 NOTE — Telephone Encounter (Signed)
[place] has called and left message for pt to call x 1. I will close encounter on my end as pt is aware

## 2017-11-22 DIAGNOSIS — M62838 Other muscle spasm: Secondary | ICD-10-CM | POA: Diagnosis not present

## 2017-11-22 DIAGNOSIS — M6281 Muscle weakness (generalized): Secondary | ICD-10-CM | POA: Diagnosis not present

## 2017-11-22 DIAGNOSIS — R102 Pelvic and perineal pain: Secondary | ICD-10-CM | POA: Diagnosis not present

## 2017-11-22 DIAGNOSIS — R3121 Asymptomatic microscopic hematuria: Secondary | ICD-10-CM | POA: Diagnosis not present

## 2017-12-02 NOTE — Telephone Encounter (Signed)
My chart came back unread, patient aware to stop supplement.

## 2017-12-06 DIAGNOSIS — M6281 Muscle weakness (generalized): Secondary | ICD-10-CM | POA: Diagnosis not present

## 2017-12-06 DIAGNOSIS — M62838 Other muscle spasm: Secondary | ICD-10-CM | POA: Diagnosis not present

## 2017-12-06 DIAGNOSIS — N3281 Overactive bladder: Secondary | ICD-10-CM | POA: Diagnosis not present

## 2017-12-06 DIAGNOSIS — R102 Pelvic and perineal pain: Secondary | ICD-10-CM | POA: Diagnosis not present

## 2017-12-20 DIAGNOSIS — M6281 Muscle weakness (generalized): Secondary | ICD-10-CM | POA: Diagnosis not present

## 2017-12-20 DIAGNOSIS — M6289 Other specified disorders of muscle: Secondary | ICD-10-CM | POA: Diagnosis not present

## 2017-12-20 DIAGNOSIS — N3946 Mixed incontinence: Secondary | ICD-10-CM | POA: Diagnosis not present

## 2017-12-20 DIAGNOSIS — M62838 Other muscle spasm: Secondary | ICD-10-CM | POA: Diagnosis not present

## 2018-01-03 DIAGNOSIS — M6289 Other specified disorders of muscle: Secondary | ICD-10-CM | POA: Diagnosis not present

## 2018-01-03 DIAGNOSIS — M6281 Muscle weakness (generalized): Secondary | ICD-10-CM | POA: Diagnosis not present

## 2018-01-03 DIAGNOSIS — N3946 Mixed incontinence: Secondary | ICD-10-CM | POA: Diagnosis not present

## 2018-01-03 DIAGNOSIS — M62838 Other muscle spasm: Secondary | ICD-10-CM | POA: Diagnosis not present

## 2018-01-31 ENCOUNTER — Encounter: Payer: Self-pay | Admitting: Obstetrics & Gynecology

## 2018-02-18 ENCOUNTER — Other Ambulatory Visit: Payer: Self-pay | Admitting: Obstetrics & Gynecology

## 2018-02-18 ENCOUNTER — Other Ambulatory Visit: Payer: Self-pay | Admitting: Family Medicine

## 2018-02-18 DIAGNOSIS — Z1231 Encounter for screening mammogram for malignant neoplasm of breast: Secondary | ICD-10-CM

## 2018-03-24 ENCOUNTER — Ambulatory Visit
Admission: RE | Admit: 2018-03-24 | Discharge: 2018-03-24 | Disposition: A | Discharge status: Home / Self Care | Payer: BLUE CROSS/BLUE SHIELD | Source: Ambulatory Visit | Attending: Obstetrics & Gynecology | Admitting: Obstetrics & Gynecology

## 2018-03-24 DIAGNOSIS — Z1231 Encounter for screening mammogram for malignant neoplasm of breast: Secondary | ICD-10-CM | POA: Diagnosis not present

## 2018-04-26 ENCOUNTER — Ambulatory Visit: Payer: Self-pay

## 2018-04-26 DIAGNOSIS — J209 Acute bronchitis, unspecified: Secondary | ICD-10-CM | POA: Diagnosis not present

## 2018-04-26 DIAGNOSIS — R030 Elevated blood-pressure reading, without diagnosis of hypertension: Secondary | ICD-10-CM | POA: Diagnosis not present

## 2018-04-26 NOTE — Telephone Encounter (Signed)
Pt c/o 3 weeks of cough and worsening shortness of breath. Pt stated that she is SOB with exertion only and can talk in full sentences. Pt stated that she has a h/o bronchitis 2 months ago.  Pt also c/o feeling a "lump" in her throat. Asked pt to check her tonsils and she said that they were red and swollen. Pt has a productive cough. Called flow at PCP office and spoke with Maudie Mercury who agreed that pt needs to go to Urgent Care.  Care advice given and pt verbalized understanding. Pt agrees to go to [facility].  Reason for Disposition . [1] MILD difficulty breathing (e.g., minimal/no SOB at rest, SOB with walking, pulse <100) AND [2] NEW-onset or WORSE than normal  Answer Assessment - Initial Assessment Questions 1. RESPIRATORY STATUS: "Describe your breathing?" (e.g., wheezing, shortness of breath, unable to speak, severe coughing)      Shortness of Breath with exertion, feels lump in throat  2. ONSET: "When did this breathing problem begin?"      3 weeks but has gotten worse  3. PATTERN "Does the difficult breathing come and go, or has it been constant since it started?"      Comes and goes 4. SEVERITY: "How bad is your breathing?" (e.g., mild, moderate, severe)    - MILD: No SOB at rest, mild SOB with walking, speaks normally in sentences, can lay down, no retractions, pulse < 100.    - MODERATE: SOB at rest, SOB with minimal exertion and prefers to sit, cannot lie down flat, speaks in phrases, mild retractions, audible wheezing, pulse 100-120.    - SEVERE: Very SOB at rest, speaks in single words, struggling to breathe, sitting hunched forward, retractions, pulse > 120      Mild to Moderate - SOB at rest and with exertion, pt is talking in full sentences HR  69  5. RECURRENT SYMPTOM: "Have you had difficulty breathing before?" If so, ask: "When was the last time?" and "What happened that time?"      Summer-usually treats on own- has seasonal allergies 6. CARDIAC HISTORY: "Do you have any history of  heart disease?" (e.g., heart attack, angina, bypass surgery, angioplasty)      no 7. LUNG HISTORY: "Do you have any history of lung disease?"  (e.g., pulmonary embolus, asthma, emphysema)     H/o bronchitis 8. CAUSE: "What do you think is causing the breathing problem?"      bronchitits 9. OTHER SYMPTOMS: "Do you have any other symptoms? (e.g., dizziness, runny nose, cough, chest pain, fever)     When she swallows she feels a lump, cough,  10. PREGNANCY: "Is there any chance you are pregnant?" "When was your last menstrual period?"       n/a 11. TRAVEL: "Have you traveled out of the country in the last month?" (e.g., travel history, exposures)       no  Protocols used: BREATHING DIFFICULTY-A-AH

## 2018-04-28 ENCOUNTER — Ambulatory Visit: Payer: Self-pay

## 2018-04-28 NOTE — Telephone Encounter (Signed)
Pt called stating that she has been running a high BP .  Today she is 150/110.  She denys headache but states she has a little tingling in her head sometimes. She denies this symptom at this time. She states she has gone to urgent care yesterday about her BP but continues to have her BP reading high. She states she does not take any medication for BP.  Applointment scheduled per protocol.  Care advice read to patient. Pt verbalized understanding of all instructions. Reason for Disposition . Systolic BP  >= 250 OR Diastolic >= 539  Answer Assessment - Initial Assessment Questions 1. BLOOD PRESSURE: "What is the blood pressure?" "Did you take at least two measurements 5 minutes apart?"     154 /115   2. ONSET: "When did you take your blood pressure?"     Today 10 minutes ago 3. HOW: "How did you obtain the blood pressure?" (e.g., visiting nurse, automatic home BP monitor)     Home device 4. HISTORY: "Do you have a history of high blood pressure?"     yes 5. MEDICATIONS: "Are you taking any medications for blood pressure?" "Have you missed any doses recently?"     Pt does not take BP medication 6. OTHER SYMPTOMS: "Do you have any symptoms?" (e.g., headache, chest pain, blurred vision, difficulty breathing, weakness)     No  7. PREGNANCY: "Is there any chance you are pregnant?" "When was your last menstrual period?"     No  Protocols used: HIGH BLOOD PRESSURE-A-AH

## 2018-04-29 ENCOUNTER — Ambulatory Visit: Payer: Self-pay | Admitting: *Deleted

## 2018-04-29 ENCOUNTER — Encounter: Payer: Self-pay | Admitting: Family Medicine

## 2018-04-29 ENCOUNTER — Ambulatory Visit (INDEPENDENT_AMBULATORY_CARE_PROVIDER_SITE_OTHER): Payer: BLUE CROSS/BLUE SHIELD | Admitting: Family Medicine

## 2018-04-29 VITALS — BP 164/89 | HR 69 | Temp 98.3°F | Resp 16 | Ht 64.75 in | Wt 193.0 lb

## 2018-04-29 DIAGNOSIS — I10 Essential (primary) hypertension: Secondary | ICD-10-CM

## 2018-04-29 LAB — BASIC METABOLIC PANEL
BUN: 21 mg/dL (ref 6–23)
CO2: 29 mEq/L (ref 19–32)
Calcium: 9.5 mg/dL (ref 8.4–10.5)
Chloride: 105 mEq/L (ref 96–112)
Creatinine, Ser: 0.98 mg/dL (ref 0.40–1.20)
GFR: 62.64 mL/min (ref >60.00)
GLUCOSE: 83 mg/dL (ref 70–99)
Potassium: 4.5 mEq/L (ref 3.5–5.1)
Sodium: 142 mEq/L (ref 135–145)

## 2018-04-29 LAB — TSH: TSH: 0.86 u[IU]/mL (ref 0.35–4.50)

## 2018-04-29 MED ORDER — LISINOPRIL 10 MG PO TABS: 10.0000 mg | ORAL_TABLET | Freq: Every day | ORAL | 1 refills | Status: DC

## 2018-04-29 MED ORDER — HYDROCHLOROTHIAZIDE 25 MG PO TABS: 25.0000 mg | ORAL_TABLET | Freq: Every day | ORAL | 0 refills | Status: DC

## 2018-04-29 NOTE — Addendum Note (Signed)
Addended by: Tammi Sou on: 04/29/2018 04:56 PM   Modules accepted: Orders

## 2018-04-29 NOTE — Telephone Encounter (Signed)
Please advise. Thanks.  

## 2018-04-29 NOTE — Progress Notes (Addendum)
OFFICE VISIT  04/29/2018   CC:  Chief Complaint  Patient presents with  . Elevated BP    Pt is not fasting.    HPI:    Patient is a 57 y.o. Caucasian female, pt of Dr. Raoul Pitch, who presents for elevated blood pressure.  This is my first time meeting her. Review of BP's from EMR show mostly bp's <130/80, with all bp's being less than 140/90.  Recent URI/bronchitis, was seen at [facility] [address], BP was 053 systolic and this shocked her. Has had some milder bp elevations in the past but nothing that high.  Checks at home after this was still elevated in 976-734L systolic, diastolics up to 937. No decongestant or caffeine use.  She is not a smoker.  She is sedentary, works as an Production assistant, radio.  At urgent care a nebulizer tx helped her bronchitis. She is on amoxil for recent dental implant.  ROS: no CP, SOB, HAs, vision c/o's, or palpitations.  No polyuria or polydipsia.  Past Medical History:  Diagnosis Date  . Asthma   . Chronic fatigue   . Elevated blood pressure reading with diagnosis of hypertension   . Fibromyalgia   . GERD (gastroesophageal reflux disease)   . Glaucoma   . IBS (irritable bowel syndrome)   . Osteopenia 09/2016   T score -1.3 FRAX 5.3%/0.3%  . Urinary incontinence     Past Surgical History:  Procedure Laterality Date  . CHOLECYSTECTOMY      Outpatient Medications Prior to Visit  Medication Sig Dispense Refill  . amoxicillin (AMOXIL) 500 MG tablet Take 1 tablet by mouth every 6 (six) hours.    . Cholecalciferol (VITAMIN D3) 2400 UNIT/ML LIQD 2,400 Units by Does not apply route daily.    Marland Kitchen co-enzyme Q-10 30 MG capsule Take 30 mg by mouth 3 (three) times daily.    Marland Kitchen DHEA 10 MG TABS Take by mouth.    . Multiple Vitamins-Minerals (MULTIVITAMIN WITH MINERALS) tablet Take 1 tablet by mouth daily.    . naproxen (NAPROSYN) 500 MG tablet Take 1 tablet by mouth 3 (three) times daily.    . vitamin B-12 (CYANOCOBALAMIN) 100 MCG tablet Take 100 mcg by  mouth daily.    Marland Kitchen estradiol (ESTRACE VAGINAL) 0.1 MG/GM vaginal cream Place 9.02 Applicatorfuls vaginally 2 (two) times a week. (Patient not taking: Reported on 04/29/2018) 42.5 g 4  . estradiol-norethindrone (COMBIPATCH) 0.05-0.14 MG/DAY Place 1 patch onto the skin 2 (two) times a week. (Patient not taking: Reported on 04/29/2018) 24 patch 4  . Pregnenolone POWD by Does not apply route.     No facility-administered medications prior to visit.     No Known Allergies  ROS As per HPI  PE: Blood pressure (!) 164/89, pulse 69, temperature 98.3 F (36.8 C), temperature source Oral, resp. rate 16, height 5' 4.75" (1.645 m), weight 193 lb (87.5 kg), last menstrual period 04/16/2016, SpO2 97 %. Gen: Alert, well appearing.  Patient is oriented to person, place, time, and situation. AFFECT: pleasant, lucid thought and speech. CV: RRR, no m/r/g.   LUNGS: CTA bilat, nonlabored resps, good aeration in all lung fields. Neck: no thyromegaly or nodule EXT: no clubbing or cyanosis.  no edema.    LABS:    Chemistry      Component Value Date/Time   NA 140 09/28/2016 1107   K 4.5 09/28/2016 1107   CL 106 09/28/2016 1107   CO2 27 09/28/2016 1107   BUN 15 09/28/2016 1107   CREATININE  0.76 09/28/2016 1107   CREATININE 0.92 07/27/2016 1556      Component Value Date/Time   CALCIUM 9.8 09/28/2016 1107   ALKPHOS 95 02/27/2016 0919   AST 19 02/27/2016 0919   ALT 16 02/27/2016 0919   BILITOT 0.6 02/27/2016 0919     Lab Results  Component Value Date   TSH 1.17 09/14/2016    IMPRESSION AND PLAN:  HTN: hx of elevated bp's documented intermittently but nothing as consistent or elevated as she has noted lately. Start hctz 25mg  qd.  Check BMET and TSH today. Monitor bp and HR daily at home and bring numbers for review with me or Dr. Raoul Pitch when she follows up in two weeks. Discussed bp goal with pt: 130/80. Discussed general exercise and diet recommendations.  An After Visit Summary was printed  and given to the patient.  FOLLOW UP: Return in about 2 weeks (around 05/13/2018) for f/u HTN.  Signed:  Crissie Sickles, MD           12/6/20192  ADDENDUM 04/29/18, 4:55 pm:  Pt called asking about whether to take hctz since she has glaucoma. I recommended she not take the hctz and I eRx'd lisinopril 10mg  to start instead.  Signed:  Crissie Sickles, MD           04/29/2018

## 2018-04-29 NOTE — Addendum Note (Signed)
Addended by: Tammi Sou on: 04/29/2018 08:43 AM   Modules accepted: Level of Service

## 2018-04-29 NOTE — Telephone Encounter (Signed)
OK, the risk of worsening glaucoma is small, but we have plenty of other bp meds to choose from so lets have her NOT take the hctz.  I will eRx lisinopril 10mg  to take once daily.-thx

## 2018-04-29 NOTE — Telephone Encounter (Signed)
Pt advised and voiced understanding.   

## 2018-04-29 NOTE — Telephone Encounter (Signed)
Patient is calling to confirm that she can take hydrochlorothiazide (HYDRODIURIL) 25 MG tablet due to her having glaucoma.   Patient is concerned regarding the warnings about glaucoma with the diuretic and is calling to verify that it is Ok to take. Told patient I would send her concerns to provider for review.  Reason for Disposition . Caller has NON-URGENT medication question about med that PCP prescribed and triager unable to answer question  Protocols used: MEDICATION QUESTION CALL-A-AH

## 2018-05-02 ENCOUNTER — Telehealth: Payer: Self-pay

## 2018-05-02 MED ORDER — ALBUTEROL SULFATE HFA 108 (90 BASE) MCG/ACT IN AERS: 1.0000 | INHALATION_SPRAY | Freq: Four times a day (QID) | RESPIRATORY_TRACT | 0 refills | Status: DC | PRN

## 2018-05-02 NOTE — Telephone Encounter (Signed)
albuterol refilled

## 2018-05-02 NOTE — Telephone Encounter (Signed)
Spoke to patient and she is requesting a refill on Albuterol HFA, Medication is not listed in her med list, she stated that she had an old prescription and needed refills.

## 2018-05-02 NOTE — Telephone Encounter (Signed)
Detailed message left on voicemail.

## 2018-05-13 ENCOUNTER — Encounter: Payer: Self-pay | Admitting: Family Medicine

## 2018-05-13 ENCOUNTER — Ambulatory Visit (INDEPENDENT_AMBULATORY_CARE_PROVIDER_SITE_OTHER): Payer: BLUE CROSS/BLUE SHIELD | Admitting: Family Medicine

## 2018-05-13 VITALS — BP 156/97 | HR 70 | Temp 98.4°F | Resp 16 | Ht 64.75 in | Wt 190.5 lb

## 2018-05-13 DIAGNOSIS — I10 Essential (primary) hypertension: Secondary | ICD-10-CM | POA: Diagnosis not present

## 2018-05-13 DIAGNOSIS — M797 Fibromyalgia: Secondary | ICD-10-CM | POA: Diagnosis not present

## 2018-05-13 MED ORDER — BACLOFEN 10 MG PO TABS: ORAL_TABLET | ORAL | 0 refills | Status: DC

## 2018-05-13 MED ORDER — LISINOPRIL 20 MG PO TABS: 20.0000 mg | ORAL_TABLET | Freq: Every day | ORAL | 0 refills | Status: DC

## 2018-05-13 NOTE — Patient Instructions (Signed)
Take tylenol 1000 mg up to three times a day for pain.  If this is not helpful enough, then take 2 otc generic aleve tabs twice a day with food as needed for pain. Try to limit aleve to 4-5 doses per week if possible!

## 2018-05-13 NOTE — Progress Notes (Signed)
OFFICE VISIT  05/13/2018   CC:  Chief Complaint  Patient presents with  . Follow-up    HTN, pt is not fasting.    HPI:    Patient is a 57 y.o. Caucasian female who presents for 2 week f/u HTN.  Home bp's since getting on lisinopril 10mg  qd: Avg syst 130 or so, avg diast around 90.  HR avg 60s. No longer feels any palpitations since getting on this med! Occ brief tingling in scalp area since getting on this med. No cough, swelling, rash, or dizziness.    She does have hx of fibromyalgia, has some occ muscle tension that has responded to baclofen in the past, which she says Dr. Raoul Pitch rx'd a couple years ago and rx is expired. She asks if I'll rx some more of these---she takes them VERY infrequently.  Past Medical History:  Diagnosis Date  . Asthma   . Chronic fatigue   . Fibromyalgia   . GERD (gastroesophageal reflux disease)   . Glaucoma   . Hypertension 2019  . IBS (irritable bowel syndrome)   . Osteopenia 09/2016   T score -1.3 FRAX 5.3%/0.3%  . Palpitations    echo and stress test normal 2015 per pt  . Urinary incontinence     Past Surgical History:  Procedure Laterality Date  . CHOLECYSTECTOMY    . TRANSTHORACIC ECHOCARDIOGRAM  11/2013   EF 50-55%, normal LV function, normal wall motion, triavial MR and mild tricuspid regurg.    Outpatient Medications Prior to Visit  Medication Sig Dispense Refill  . albuterol (PROVENTIL HFA;VENTOLIN HFA) 108 (90 Base) MCG/ACT inhaler Inhale 1-2 puffs into the lungs every 6 (six) hours as needed for wheezing or shortness of breath. 1 Inhaler 0  . Cholecalciferol (VITAMIN D3) 2400 UNIT/ML LIQD 2,400 Units by Does not apply route daily.    Marland Kitchen co-enzyme Q-10 30 MG capsule Take 30 mg by mouth 3 (three) times daily.    Marland Kitchen DHEA 10 MG TABS Take by mouth.    . Multiple Vitamins-Minerals (MULTIVITAMIN WITH MINERALS) tablet Take 1 tablet by mouth daily.    . Pregnenolone POWD by Does not apply route.    . vitamin B-12 (CYANOCOBALAMIN)  100 MCG tablet Take 100 mcg by mouth daily.    Marland Kitchen lisinopril (PRINIVIL,ZESTRIL) 10 MG tablet Take 1 tablet (10 mg total) by mouth daily. 30 tablet 1  . amoxicillin (AMOXIL) 500 MG tablet Take 1 tablet by mouth every 6 (six) hours.    Marland Kitchen estradiol (ESTRACE VAGINAL) 0.1 MG/GM vaginal cream Place 2.94 Applicatorfuls vaginally 2 (two) times a week. (Patient not taking: Reported on 04/29/2018) 42.5 g 4  . estradiol-norethindrone (COMBIPATCH) 0.05-0.14 MG/DAY Place 1 patch onto the skin 2 (two) times a week. (Patient not taking: Reported on 04/29/2018) 24 patch 4  . naproxen (NAPROSYN) 500 MG tablet Take 1 tablet by mouth 3 (three) times daily.     No facility-administered medications prior to visit.     No Known Allergies  ROS As per HPI  PE: Blood pressure (!) 156/97, pulse 70, temperature 98.4 F (36.9 C), temperature source Oral, resp. rate 16, height 5' 4.75" (1.645 m), weight 190 lb 8 oz (86.4 kg), last menstrual period 04/16/2016, SpO2 97 %. Gen: Alert, well appearing.  Patient is oriented to person, place, time, and situation. AFFECT: pleasant, lucid thought and speech. Nofurther exam today.  LABS:    Chemistry      Component Value Date/Time   NA 142 04/29/2018 0849  K 4.5 04/29/2018 0849   CL 105 04/29/2018 0849   CO2 29 04/29/2018 0849   BUN 21 04/29/2018 0849   CREATININE 0.98 04/29/2018 0849   CREATININE 0.92 07/27/2016 1556      Component Value Date/Time   CALCIUM 9.5 04/29/2018 0849   ALKPHOS 95 02/27/2016 0919   AST 19 02/27/2016 0919   ALT 16 02/27/2016 0919   BILITOT 0.6 02/27/2016 0919     Lab Results  Component Value Date   TSH 0.86 04/29/2018    IMPRESSION AND PLAN:  1) Uncontrolled HTN: a bit improved but still needs to increase med-->increase lisinopril to 20mg  qd. Continue home bp monitoring.  Goal bp <130/80. Low Na diet. Exercise. Check BMET at next f/u in 1 mo. Instructions:  Would like you to avoid Aspirin AND try to avoid NSAIDs if  possible. Take tylenol 1000 mg up to three times a day for pain. If this is not helpful enough, then take 2 otc generic aleve tabs twice a day with food as needed for pain. Try to limit aleve to 4-5 doses per week if possible!  2) Fibromyalgia muscle tension: I rx'd baclofen 10mg , 1 tid prn, #30, no RF-->this has been helpful with only sporadic use in the past.  An After Visit Summary was printed and given to the patient.  FOLLOW UP: Return in about 4 weeks (around 06/10/2018) for f/u HTN--check BMET.  Signed:  Crissie Sickles, MD           05/13/2018

## 2018-05-21 ENCOUNTER — Other Ambulatory Visit: Payer: Self-pay | Admitting: Family Medicine

## 2018-05-25 DIAGNOSIS — D751 Secondary polycythemia: Secondary | ICD-10-CM

## 2018-05-25 HISTORY — DX: Secondary polycythemia: D75.1

## 2018-06-02 ENCOUNTER — Telehealth: Payer: Self-pay | Admitting: *Deleted

## 2018-06-02 MED ORDER — SULFAMETHOXAZOLE-TRIMETHOPRIM 800-160 MG PO TABS: 1.0000 | ORAL_TABLET | Freq: Two times a day (BID) | ORAL | 0 refills | Status: DC

## 2018-06-02 NOTE — Telephone Encounter (Signed)
Can send Bactrim 1 tab PO BID x 3 days.  #6 tab, no refill.  If no improvement after 24 hours, U/A and Culture to be done.  Push water intake.

## 2018-06-02 NOTE — Telephone Encounter (Signed)
Patient informed, Rx sent.  

## 2018-06-02 NOTE — Telephone Encounter (Signed)
Patient called c/o UTI urgency, urine odor x 1 week now, traveling to [place] x 1 week would like to have Rx before leaving. Please advise

## 2018-06-04 ENCOUNTER — Other Ambulatory Visit: Payer: Self-pay | Admitting: Family Medicine

## 2018-06-10 ENCOUNTER — Ambulatory Visit: Payer: BLUE CROSS/BLUE SHIELD | Admitting: Family Medicine

## 2018-06-17 ENCOUNTER — Encounter: Payer: Self-pay | Admitting: Family Medicine

## 2018-06-17 ENCOUNTER — Ambulatory Visit: Payer: BLUE CROSS/BLUE SHIELD | Admitting: Family Medicine

## 2018-06-17 ENCOUNTER — Ambulatory Visit (INDEPENDENT_AMBULATORY_CARE_PROVIDER_SITE_OTHER): Payer: BLUE CROSS/BLUE SHIELD | Admitting: Family Medicine

## 2018-06-17 VITALS — BP 146/86 | HR 56 | Temp 98.1°F | Resp 16 | Ht 64.75 in | Wt 194.2 lb

## 2018-06-17 DIAGNOSIS — I10 Essential (primary) hypertension: Secondary | ICD-10-CM | POA: Diagnosis not present

## 2018-06-17 LAB — BASIC METABOLIC PANEL
BUN: 12 mg/dL (ref 6–23)
CO2: 28 mEq/L (ref 19–32)
Calcium: 9.7 mg/dL (ref 8.4–10.5)
Chloride: 103 mEq/L (ref 96–112)
Creatinine, Ser: 0.95 mg/dL (ref 0.40–1.20)
GFR: 61.05 mL/min (ref >60.00)
Glucose, Bld: 89 mg/dL (ref 70–99)
Potassium: 3.9 mEq/L (ref 3.5–5.1)
Sodium: 139 mEq/L (ref 135–145)

## 2018-06-17 MED ORDER — LISINOPRIL 30 MG PO TABS: 30.0000 mg | ORAL_TABLET | Freq: Every day | ORAL | 0 refills | Status: DC

## 2018-06-17 NOTE — Progress Notes (Signed)
OFFICE VISIT  06/17/2018   CC:  Chief Complaint  Patient presents with  . Follow-up    HTN   HPI:    Patient is a 58 y.o. Caucasian female who presents for 1 mo f/u HTN. Increased lisin to 20mg  qd last visit. Home bp's: systol's avg 130-135, diast avg 90-92 or so, HR upper 60s to upper 70s. Denies any side effects from the med.    Past Medical History:  Diagnosis Date  . Asthma   . Chronic fatigue   . Fibromyalgia   . GERD (gastroesophageal reflux disease)   . Glaucoma   . Hypertension 2019  . IBS (irritable bowel syndrome)   . Osteopenia 09/2016   T score -1.3 FRAX 5.3%/0.3%  . Palpitations    echo and stress test normal 2015 per pt  . Urinary incontinence     Past Surgical History:  Procedure Laterality Date  . CHOLECYSTECTOMY    . TRANSTHORACIC ECHOCARDIOGRAM  11/2013   EF 50-55%, normal LV function, normal wall motion, triavial MR and mild tricuspid regurg.    Outpatient Medications Prior to Visit  Medication Sig Dispense Refill  . albuterol (PROVENTIL HFA;VENTOLIN HFA) 108 (90 Base) MCG/ACT inhaler Inhale 1-2 puffs into the lungs every 6 (six) hours as needed for wheezing or shortness of breath. 1 Inhaler 0  . baclofen (LIORESAL) 10 MG tablet 1 tab po tid prn 30 each 0  . Cholecalciferol (VITAMIN D3) 2400 UNIT/ML LIQD 2,400 Units by Does not apply route daily.    Marland Kitchen co-enzyme Q-10 30 MG capsule Take 30 mg by mouth 3 (three) times daily.    Marland Kitchen DHEA 10 MG TABS Take by mouth.    . Multiple Vitamins-Minerals (MULTIVITAMIN WITH MINERALS) tablet Take 1 tablet by mouth daily.    . vitamin B-12 (CYANOCOBALAMIN) 100 MCG tablet Take 100 mcg by mouth daily.    Marland Kitchen lisinopril (PRINIVIL,ZESTRIL) 20 MG tablet TAKE 1 TABLET BY MOUTH EVERY DAY 30 tablet 0  . Pregnenolone POWD by Does not apply route.    . sulfamethoxazole-trimethoprim (BACTRIM DS,SEPTRA DS) 800-160 MG tablet Take 1 tablet by mouth 2 (two) times daily. (Patient not taking: Reported on 06/17/2018) 6 tablet 0   No  facility-administered medications prior to visit.     No Known Allergies  ROS As per HPI  PE: Blood pressure (!) 146/86, pulse (!) 56, temperature 98.1 F (36.7 C), temperature source Oral, resp. rate 16, height 5' 4.75" (1.645 m), weight 194 lb 4 oz (88.1 kg), last menstrual period 04/16/2016, SpO2 97 %. Gen: Alert, well appearing.  Patient is oriented to person, place, time, and situation. AFFECT: pleasant, lucid thought and speech. SWN:IOEV: no injection, icteris, swelling, or exudate.  EOMI, PERRLA. Mouth: lips without lesion/swelling.  Oral mucosa pink and moist. Oropharynx without erythema, exudate, or swelling.  CV: RRR, no m/r/g.   LUNGS: CTA bilat, nonlabored resps, good aeration in all lung fields. EXT: no clubbing or cyanosis.  no edema.    LABS:    Chemistry      Component Value Date/Time   NA 142 04/29/2018 0849   K 4.5 04/29/2018 0849   CL 105 04/29/2018 0849   CO2 29 04/29/2018 0849   BUN 21 04/29/2018 0849   CREATININE 0.98 04/29/2018 0849   CREATININE 0.92 07/27/2016 1556      Component Value Date/Time   CALCIUM 9.5 04/29/2018 0849   ALKPHOS 95 02/27/2016 0919   AST 19 02/27/2016 0919   ALT 16 02/27/2016 0919  BILITOT 0.6 02/27/2016 0919       IMPRESSION AND PLAN:  Uncontrolled HTN, but improving. Will increase lisinopril to 30mg  qd. Check BMET today. She finds it more convenient to keep following up with me for this problem, but once we get her bp controlled/stable, then she'll resume all f/u with Dr. Raoul Pitch (her PMD).  An After Visit Summary was printed and given to the patient.  FOLLOW UP: Return in about 4 weeks (around 07/15/2018) for f/u HTN.  Signed:  Crissie Sickles, MD           06/17/2018

## 2018-06-20 ENCOUNTER — Ambulatory Visit: Payer: Self-pay | Admitting: *Deleted

## 2018-06-20 NOTE — Telephone Encounter (Signed)
Attempted to contact pt; left message on voicemail [phone]; pt last seen in office 1/ /24/2020 by Dr Anitra Lauth; will route to office for final disposition.

## 2018-06-20 NOTE — Telephone Encounter (Signed)
Called pt Left VM to call office

## 2018-06-21 ENCOUNTER — Ambulatory Visit: Payer: BLUE CROSS/BLUE SHIELD

## 2018-06-21 ENCOUNTER — Other Ambulatory Visit: Payer: Self-pay | Admitting: Obstetrics & Gynecology

## 2018-06-21 ENCOUNTER — Ambulatory Visit: Payer: BLUE CROSS/BLUE SHIELD | Admitting: Family Medicine

## 2018-06-21 ENCOUNTER — Ambulatory Visit (INDEPENDENT_AMBULATORY_CARE_PROVIDER_SITE_OTHER): Payer: BLUE CROSS/BLUE SHIELD

## 2018-06-21 DIAGNOSIS — Z23 Encounter for immunization: Secondary | ICD-10-CM | POA: Diagnosis not present

## 2018-06-21 NOTE — Telephone Encounter (Signed)
Dr. Dellis Filbert you prescribed Rx on 06/02/18 patient said she never took Rx because she felt better. Now she has symptoms and reports she lost Rx, this is why the request for refill. Please advise

## 2018-06-27 ENCOUNTER — Ambulatory Visit: Payer: BLUE CROSS/BLUE SHIELD

## 2018-07-11 ENCOUNTER — Other Ambulatory Visit: Payer: Self-pay

## 2018-07-11 ENCOUNTER — Ambulatory Visit: Payer: BLUE CROSS/BLUE SHIELD | Admitting: Family Medicine

## 2018-07-11 ENCOUNTER — Encounter: Payer: Self-pay | Admitting: Family Medicine

## 2018-07-11 VITALS — BP 151/97 | HR 70 | Temp 98.5°F | Resp 16 | Ht 64.75 in | Wt 192.0 lb

## 2018-07-11 DIAGNOSIS — J0100 Acute maxillary sinusitis, unspecified: Secondary | ICD-10-CM

## 2018-07-11 MED ORDER — DOXYCYCLINE HYCLATE 100 MG PO TABS: 100.0000 mg | ORAL_TABLET | Freq: Two times a day (BID) | ORAL | 0 refills | Status: DC

## 2018-07-11 NOTE — Progress Notes (Signed)
Rebecca Pollard , 28-Mar-1961, 58 y.o., female MRN: [id number] Patient Care Team    Relationship Specialty Notifications Start End  Ma Hillock, DO PCP - General Family Medicine  05/31/15     Chief Complaint  Patient presents with  . Nasal Congestion    going on for about a week     Subjective: Pt presents for an OV with complaints of worsening cough and congestion after treatment last week and a [place] urgent care.  She reports she believes her symptoms were actually started the week prior before leaving to [place].  She was having some chest tightness and was seen by another provider and was provided with Bactrim because she was also showing signs of UTI.  She states she finished the Bactrim 1 day before she left for [place] to visit her parents.  While in [place] she became more congested, copious runny nose, chills, cough and chest tightness.  She was seen in urgent care and provided with prednisone and a azithromycin.  States she was also told she had bilateral ear infections.  She states she is not feeling any better despite treatment.  She is still having low-grade fevers at home.  Depression screen PHQ 2/9 09/17/2017  Decreased Interest 0  Down, Depressed, Hopeless 0  PHQ - 2 Score 0    No Known Allergies Social History   Social History Narrative   Married to [place]. 2 children, Alice and [place].    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    Drinks caffeine, herbal remedies, daily vitamin use.    Wears her seatbelt, smoke detector in the home, no firearms in the home.    Feels safe in her relationships.    Exercises 2x a week.    Past Medical History:  Diagnosis Date  . Asthma   . Chronic fatigue   . Fibromyalgia   . GERD (gastroesophageal reflux disease)   . Glaucoma   . Hypertension 2019  . IBS (irritable bowel syndrome)   . Osteopenia 09/2016   T score -1.3 FRAX 5.3%/0.3%  . Palpitations    echo and stress test normal 2015 per pt  . Urinary incontinence      Past Surgical History:  Procedure Laterality Date  . CHOLECYSTECTOMY    . TRANSTHORACIC ECHOCARDIOGRAM  11/2013   EF 50-55%, normal LV function, normal wall motion, triavial MR and mild tricuspid regurg.   Family History  Problem Relation Age of Onset  . Hearing loss Mother   . Arthritis Father   . Hearing loss Father   . Stroke Father   . Breast cancer Paternal Grandmother   . Leukemia Maternal Uncle    Allergies as of 07/11/2018   No Known Allergies     Medication List       Accurate as of July 11, 2018 11:05 AM. Always use your most recent med list.        albuterol 108 (90 Base) MCG/ACT inhaler Commonly known as:  PROVENTIL HFA;VENTOLIN HFA Inhale 1-2 puffs into the lungs every 6 (six) hours as needed for wheezing or shortness of breath.   azithromycin 250 MG tablet Commonly known as:  ZITHROMAX Take by mouth daily.   co-enzyme Q-10 30 MG capsule Take 30 mg by mouth 3 (three) times daily.   DHEA 10 MG Tabs Take by mouth.   lisinopril 30 MG tablet Commonly known as:  PRINIVIL,ZESTRIL Take 1 tablet (30 mg total) by mouth daily.   methylPREDNISolone 4 MG tablet Commonly known as:  MEDROL Take 4 mg by mouth daily.   multivitamin with minerals tablet Take 1 tablet by mouth daily.   vitamin B-12 100 MCG tablet Commonly known as:  CYANOCOBALAMIN Take 100 mcg by mouth daily.   Vitamin D3 2400 UNIT/ML Liqd 2,400 Units by Does not apply route daily.       All past medical history, surgical history, allergies, family history, immunizations andmedications were updated in the EMR today and reviewed under the history and medication portions of their EMR.     ROS: Negative, with the exception of above mentioned in HPI  Objective:  BP (!) 151/97   Pulse 70   Temp 98.5 F (36.9 C) (Oral)   Resp 16   Ht 5' 4.75" (1.645 m)   Wt 192 lb (87.1 kg)   LMP 04/16/2016 (Approximate)   SpO2 99%   BMI 32.20 kg/m  Body mass index is 32.2 kg/m. Gen:  Afebrile. No acute distress. Nontoxic in appearance, well developed, well nourished.  HENT: AT. [place]. Bilateral TM visualized without erythema, fluid levels appreciated. MMM, no oral lesions. Bilateral nares with erythema, swelling and drainage. Throat without erythema or exudates.  Postnasal drip, cough and hoarseness present Eyes:Pupils Equal Round Reactive to light, Extraocular movements intact,  Conjunctiva without redness, discharge or icterus. Neck/lymp/endocrine: Supple, no lymphadenopathy CV: RRR  Chest: CTAB, no wheeze or crackles.  No rhonchi present.  Good air movement, normal resp effort.  Skin: No rashes, purpura or petechiae.  Neuro:  Normal gait. PERLA. EOMi. Alert. Oriented x3 Psych: Normal affect, dress and demeanor. Normal speech. Normal thought content and judgment.  No exam data present No results found. No results found for this or any previous visit (from the past 24 hour(s)).  Assessment/Plan: Kathrynne Kulinski is a 58 y.o. female present for OV for  Acute non-recurrent maxillary sinusitis Acute non-recurrent maxillary sinusitis Rest, hydrate.  + flonase, mucinex (DM if cough), nettie pot or nasal saline.  doxy prescribed, take until completed.  If cough present it can last up to 6-8 weeks.  F/U 2 weeks of not improved.     Reviewed expectations re: course of current medical issues.  Discussed self-management of symptoms.  Outlined signs and symptoms indicating need for more acute intervention.  Patient verbalized understanding and all questions were answered.  Patient received an After-Visit Summary.    No orders of the defined types were placed in this encounter.    Note is dictated utilizing voice recognition software. Although note has been proof read prior to signing, occasional typographical errors still can be missed. If any questions arise, please do not hesitate to call for verification.   electronically signed by:  Howard Pouch, DO  [place]

## 2018-07-11 NOTE — Patient Instructions (Addendum)
Continue the steroid as directed.  Start doxycyline today.  Continue mucinex.  Rest and hydrate.  Flonase nasal spray can be helpful.     Sinusitis, Adult Sinusitis is soreness and swelling (inflammation) of your sinuses. Sinuses are hollow spaces in the bones around your face. They are located:  Around your eyes.  In the middle of your forehead.  Behind your nose.  In your cheekbones. Your sinuses and nasal passages are lined with a fluid called mucus. Mucus drains out of your sinuses. Swelling can trap mucus in your sinuses. This lets germs (bacteria, virus, or fungus) grow, which leads to infection. Most of the time, this condition is caused by a virus. What are the causes? This condition is caused by:  Allergies.  Asthma.  Germs.  Things that block your nose or sinuses.  Growths in the nose (nasal polyps).  Chemicals or irritants in the air.  Fungus (rare). What increases the risk? You are more likely to develop this condition if:  You have a weak body defense system (immune system).  You do a lot of swimming or diving.  You use nasal sprays too much.  You smoke. What are the signs or symptoms? The main symptoms of this condition are pain and a feeling of pressure around the sinuses. Other symptoms include:  Stuffy nose (congestion).  Runny nose (drainage).  Swelling and warmth in the sinuses.  Headache.  Toothache.  A cough that may get worse at night.  Mucus that collects in the throat or the back of the nose (postnasal drip).  Being unable to smell and taste.  Being very tired (fatigue).  A fever.  Sore throat.  Bad breath. How is this diagnosed? This condition is diagnosed based on:  Your symptoms.  Your medical history.  A physical exam.  Tests to find out if your condition is short-term (acute) or long-term (chronic). Your doctor may: ? Check your nose for growths (polyps). ? Check your sinuses using a tool that has a light  (endoscope). ? Check for allergies or germs. ? Do imaging tests, such as an MRI or CT scan. How is this treated? Treatment for this condition depends on the cause and whether it is short-term or long-term.  If caused by a virus, your symptoms should go away on their own within 10 days. You may be given medicines to relieve symptoms. They include: ? Medicines that shrink swollen tissue in the nose. ? Medicines that treat allergies (antihistamines). ? A spray that treats swelling of the nostrils. ? Rinses that help get rid of thick mucus in your nose (nasal saline washes).  If caused by bacteria, your doctor may wait to see if you will get better without treatment. You may be given antibiotic medicine if you have: ? A very bad infection. ? A weak body defense system.  If caused by growths in the nose, you may need to have surgery. Follow these instructions at home: Medicines  Take, use, or apply over-the-counter and prescription medicines only as told by your doctor. These may include nasal sprays.  If you were prescribed an antibiotic medicine, take it as told by your doctor. Do not stop taking the antibiotic even if you start to feel better. Hydrate and humidify   Drink enough water to keep your pee (urine) pale yellow.  Use a cool mist humidifier to keep the humidity level in your home above 50%.  Breathe in steam for 10-15 minutes, 3-4 times a day, or as told  by your doctor. You can do this in the bathroom while a hot shower is running.  Try not to spend time in cool or dry air. Rest  Rest as much as you can.  Sleep with your head raised (elevated).  Make sure you get enough sleep each night. General instructions   Put a warm, moist washcloth on your face 3-4 times a day, or as often as told by your doctor. This will help with discomfort.  Wash your hands often with soap and water. If there is no soap and water, use hand sanitizer.  Do not smoke. Avoid being around  people who are smoking (secondhand smoke).  Keep all follow-up visits as told by your doctor. This is important. Contact a doctor if:  You have a fever.  Your symptoms get worse.  Your symptoms do not get better within 10 days. Get help right away if:  You have a very bad headache.  You cannot stop throwing up (vomiting).  You have very bad pain or swelling around your face or eyes.  You have trouble seeing.  You feel confused.  Your neck is stiff.  You have trouble breathing. Summary  Sinusitis is swelling of your sinuses. Sinuses are hollow spaces in the bones around your face.  This condition is caused by tissues in your nose that become inflamed or swollen. This traps germs. These can lead to infection.  If you were prescribed an antibiotic medicine, take it as told by your doctor. Do not stop taking it even if you start to feel better.  Keep all follow-up visits as told by your doctor. This is important. This information is not intended to replace advice given to you by your health care provider. Make sure you discuss any questions you have with your health care provider. Document Released: 10/28/2007 Document Revised: 10/11/2017 Document Reviewed: 10/11/2017 Elsevier Interactive Patient Education  2019 [employer].

## 2018-07-13 ENCOUNTER — Other Ambulatory Visit: Payer: Self-pay | Admitting: Family Medicine

## 2018-07-13 ENCOUNTER — Other Ambulatory Visit: Payer: Self-pay

## 2018-07-13 NOTE — Progress Notes (Signed)
Opened in error

## 2018-07-13 NOTE — Telephone Encounter (Signed)
Opened in error

## 2018-07-16 ENCOUNTER — Encounter: Payer: Self-pay | Admitting: Family Medicine

## 2018-07-18 ENCOUNTER — Ambulatory Visit: Payer: BLUE CROSS/BLUE SHIELD | Admitting: Family Medicine

## 2018-07-20 ENCOUNTER — Other Ambulatory Visit: Payer: Self-pay | Admitting: Family Medicine

## 2018-07-22 ENCOUNTER — Ambulatory Visit: Payer: BLUE CROSS/BLUE SHIELD | Admitting: Family Medicine

## 2018-07-22 ENCOUNTER — Encounter: Payer: Self-pay | Admitting: Family Medicine

## 2018-07-22 DIAGNOSIS — J329 Chronic sinusitis, unspecified: Secondary | ICD-10-CM | POA: Insufficient documentation

## 2018-07-22 DIAGNOSIS — J320 Chronic maxillary sinusitis: Secondary | ICD-10-CM | POA: Diagnosis not present

## 2018-07-22 NOTE — Patient Instructions (Signed)
Increase fluid intake Try nasal saline a few times per day Mucinex/mucinex DM may be helpful as well Home humidifier may be helpful as well.  Sometimes cough may linger for 6-8 weeks.

## 2018-07-22 NOTE — Progress Notes (Signed)
Rebecca Pollard - 58 y.o. female MRN [id number]  Date of birth: 12-05-1960  Subjective Chief Complaint  Patient presents with  . Cough    has not been improving-still productive-completed doxycycline yesterday    HPI Rebecca Pollard is a 58 y.o. female here today for f/u of sinusitis.  Recently completed course of doxycycline and was on bactrim prior to that for UTI and prednisone and z-pack just before that.  She reports that sinus pain/pressure, chest tightness and congestion have improved however she continues to have green colored mucus from her nose.  She denies thick, yellow mucus.  She denies chest pain, shortness of breath, fever, chills.  She is not taking anything else for treatment at this time.     ROS:  A comprehensive ROS was completed and negative except as noted per HPI  No Known Allergies  Past Medical History:  Diagnosis Date  . Asthma   . Chronic fatigue   . Fibromyalgia   . GERD (gastroesophageal reflux disease)   . Glaucoma   . Hypertension 2019  . IBS (irritable bowel syndrome)   . Osteopenia 09/2016   T score -1.3 FRAX 5.3%/0.3%  . Palpitations    echo and stress test normal 2015 per pt  . Urinary incontinence     Past Surgical History:  Procedure Laterality Date  . CHOLECYSTECTOMY    . TRANSTHORACIC ECHOCARDIOGRAM  11/2013   EF 50-55%, normal LV function, normal wall motion, triavial MR and mild tricuspid regurg.    Social History   Socioeconomic History  . Marital status: Married    Spouse name: Not on file  . Number of children: Not on file  . Years of education: Not on file  . Highest education level: Not on file  Occupational History  . Not on file  Social Needs  . Financial resource strain: Not on file  . Food insecurity:    Worry: Not on file    Inability: Not on file  . Transportation needs:    Medical: Not on file    Non-medical: Not on file  Tobacco Use  . Smoking status: Never Smoker  . Smokeless tobacco: Never Used  Substance  and Sexual Activity  . Alcohol use: Not Currently    Comment: rare  . Drug use: No  . Sexual activity: Yes    Partners: Male    Birth control/protection: Other-see comments    Comment: 1st intercourse- 47, patners- 23, married- 40 yrs   Lifestyle  . Physical activity:    Days per week: Not on file    Minutes per session: Not on file  . Stress: Not on file  Relationships  . Social connections:    Talks on phone: Not on file    Gets together: Not on file    Attends religious service: Not on file    Active member of club or organization: Not on file    Attends meetings of clubs or organizations: Not on file    Relationship status: Not on file  Other Topics Concern  . Not on file  Social History Narrative   Married to [place]. 2 children, Alice and [place].    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    Drinks caffeine, herbal remedies, daily vitamin use.    Wears her seatbelt, smoke detector in the home, no firearms in the home.    Feels safe in her relationships.    Exercises 2x a week.     Family History  Problem Relation Age of Onset  .  Hearing loss Mother   . Arthritis Father   . Hearing loss Father   . Stroke Father   . Breast cancer Paternal Grandmother   . Leukemia Maternal Uncle     Health Maintenance  Topic Date Due  . Hepatitis C Screening  Mar 02, 1961  . HIV Screening  05/16/1978  . COLONOSCOPY  05/16/2013  . MAMMOGRAM  03/25/2019  . PAP SMEAR-Modifier  09/15/2019  . TETANUS/TDAP  03/12/2021  . INFLUENZA VACCINE  Completed    ----------------------------------------------------------------------------------------------------------------------------------------------------------------------------------------------------------------- Physical Exam BP 124/68   Pulse 81   Temp 98.2 F (36.8 C) (Oral)   Ht 5' 4.75" (1.645 m)   Wt 193 lb (87.5 kg)   LMP 04/16/2016 (Approximate)   SpO2 97%   BMI 32.37 kg/m   Physical Exam Constitutional:       Appearance: Normal appearance.  HENT:     Head: Normocephalic and atraumatic.     Right Ear: Tympanic membrane normal.     Left Ear: Tympanic membrane normal.     Nose: Congestion present.     Comments: Sinuses are non-tender     Mouth/Throat:     Mouth: Mucous membranes are moist.  Eyes:     General: No scleral icterus. Neck:     Musculoskeletal: Neck supple.  Cardiovascular:     Rate and Rhythm: Normal rate and regular rhythm.  Pulmonary:     Effort: Pulmonary effort is normal.     Breath sounds: Normal breath sounds.  Lymphadenopathy:     Cervical: No cervical adenopathy.  Skin:    General: Skin is warm and dry.  Neurological:     General: No focal deficit present.     Mental Status: She is alert and oriented to person, place, and time.  Psychiatric:        Mood and Affect: Mood normal.        Behavior: Behavior normal.     ------------------------------------------------------------------------------------------------------------------------------------------------------------------------------------------------------------------- Assessment and Plan  Sinusitis She has been on multiple antibiotics over the past several weeks, does not appear to have continued bacterial infection at this time.  -Discussed cough may persist for several weeks -Recommend increased fluids with mucinex/mucinex dm -Nasal saline and humidifier.

## 2018-07-22 NOTE — Assessment & Plan Note (Signed)
She has been on multiple antibiotics over the past several weeks, does not appear to have continued bacterial infection at this time.  -Discussed cough may persist for several weeks -Recommend increased fluids with mucinex/mucinex dm -Nasal saline and humidifier.

## 2018-07-25 ENCOUNTER — Encounter: Payer: Self-pay | Admitting: Family Medicine

## 2018-07-25 ENCOUNTER — Ambulatory Visit: Payer: BLUE CROSS/BLUE SHIELD | Admitting: Family Medicine

## 2018-07-25 VITALS — BP 119/80 | HR 72 | Temp 98.2°F | Resp 16 | Ht 65.0 in | Wt 193.4 lb

## 2018-07-25 DIAGNOSIS — I10 Essential (primary) hypertension: Secondary | ICD-10-CM | POA: Diagnosis not present

## 2018-07-25 MED ORDER — LISINOPRIL 30 MG PO TABS: 30.0000 mg | ORAL_TABLET | Freq: Every day | ORAL | 1 refills | Status: DC

## 2018-07-25 NOTE — Patient Instructions (Addendum)
See you in 6 months for followup.  Your BP looks great.   Check into the cologuard.

## 2018-07-25 NOTE — Progress Notes (Signed)
Rebecca Pollard , 06-04-1960, 58 y.o., female MRN: [id number] Patient Care Team    Relationship Specialty Notifications Start End  Ma Hillock, DO PCP - General Family Medicine  05/31/15     Chief Complaint  Patient presents with  . Follow-up    F/U for Hypertension. Pt has been taking medications and taking BPs at home. Pt has log of BP's     Subjective: Pt presents for an OV  Hypertension:  Pt reports compliance with lisinopril 30 mg daily. Blood pressures ranges at home in normal limits, she brings her blood pressure logs with her today.. Patient denies chest pain, shortness of breath or lower extremity edema. BMP: 06/17/2018 within normal limits CBC: 12/30/2015 within normal limits TSH: 04/29/2018 within normal limits Diet: Low-sodium Exercise: Routine exercise RF: Hypertension, obesity, family history of stroke  Depression screen PHQ 2/9 09/17/2017  Decreased Interest 0  Down, Depressed, Hopeless 0  PHQ - 2 Score 0    No Known Allergies Social History   Social History Narrative   Married to [place]. 2 children, Rebecca Pollard and Rebecca Pollard.    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    Drinks caffeine, herbal remedies, daily vitamin use.    Wears her seatbelt, smoke detector in the home, no firearms in the home.    Feels safe in her relationships.    Exercises 2x a week.    Past Medical History:  Diagnosis Date  . Asthma   . Chronic fatigue   . Fibromyalgia   . GERD (gastroesophageal reflux disease)   . Glaucoma   . Hypertension 2019  . IBS (irritable bowel syndrome)   . Osteopenia 09/2016   T score -1.3 FRAX 5.3%/0.3%  . Palpitations    echo and stress test normal 2015 per pt  . Urinary incontinence    Past Surgical History:  Procedure Laterality Date  . CHOLECYSTECTOMY    . TRANSTHORACIC ECHOCARDIOGRAM  11/2013   EF 50-55%, normal LV function, normal wall motion, triavial MR and mild tricuspid regurg.   Family History  Problem Relation Age of Onset  .  Hearing loss Mother   . Arthritis Father   . Hearing loss Father   . Stroke Father   . Breast cancer Paternal Grandmother   . Leukemia Maternal Uncle    Allergies as of 07/25/2018   No Known Allergies     Medication List       Accurate as of July 25, 2018  2:13 PM. Always use your most recent med list.        albuterol 108 (90 Base) MCG/ACT inhaler Commonly known as:  PROVENTIL HFA;VENTOLIN HFA Inhale 1-2 puffs into the lungs every 6 (six) hours as needed for wheezing or shortness of breath.   co-enzyme Q-10 30 MG capsule Take 30 mg by mouth 3 (three) times daily.   DHEA 10 MG Tabs Take by mouth.   lisinopril 30 MG tablet Commonly known as:  PRINIVIL,ZESTRIL Take 1 tablet (30 mg total) by mouth daily.   methylPREDNISolone 4 MG tablet Commonly known as:  MEDROL Take 4 mg by mouth daily.   multivitamin with minerals tablet Take 1 tablet by mouth daily.   vitamin B-12 100 MCG tablet Commonly known as:  CYANOCOBALAMIN Take 100 mcg by mouth daily.   Vitamin D3 2400 UNIT/ML Liqd 2,400 Units by Does not apply route daily.       All past medical history, surgical history, allergies, family history, immunizations andmedications were updated in the EMR today and  reviewed under the history and medication portions of their EMR.     ROS: Negative, with the exception of above mentioned in HPI   Objective:  BP 119/80 (BP Location: Left Arm, Patient Position: Sitting, Cuff Size: Normal)   Pulse 72   Temp 98.2 F (36.8 C) (Oral)   Resp 16   Ht 5\' 5"  (1.651 m)   Wt 193 lb 6 oz (87.7 kg)   LMP 04/16/2016 (Approximate)   SpO2 96%   BMI 32.18 kg/m  Body mass index is 32.18 kg/m. Gen: Afebrile. No acute distress. Nontoxic in appearance, well developed, well nourished.  Obese, Caucasian female HENT: AT. [place]. MMM Eyes:Pupils Equal Round Reactive to light, Extraocular movements intact,  Conjunctiva without redness, discharge or icterus. CV: RRR no murmur, no edema Chest:  CTAB, no wheeze or crackles. Good air movement, normal resp effort.  Skin: no rashes, purpura or petechiae.  Neuro: Normal gait. PERLA. EOMi. Alert. Oriented x3  Psych: Normal affect, dress and demeanor. Normal speech. Normal thought content and judgment.  No exam data present No results found. No results found for this or any previous visit (from the past 24 hour(s)).  Assessment/Plan: Taziyah Iannuzzi is a 58 y.o. female present for OV for  Essential hypertension . Blood pressure stable today.  Continue lisinopril 30 mg daily.  Refills provided for her today. -Low-sodium diet, routine exercise encouraged -CBC and lipids due next visit. -Follow-up in 6 months   Reviewed expectations re: course of current medical issues.  Discussed self-management of symptoms.  Outlined signs and symptoms indicating need for more acute intervention.  Patient verbalized understanding and all questions were answered.  Patient received an After-Visit Summary.   > 15 minutes spent with patient, >50% of time spent face to face    No orders of the defined types were placed in this encounter.    Note is dictated utilizing voice recognition software. Although note has been proof read prior to signing, occasional typographical errors still can be missed. If any questions arise, please do not hesitate to call for verification.   electronically signed by:  Howard Pouch, DO  [place]

## 2018-08-08 ENCOUNTER — Encounter: Payer: Self-pay | Admitting: Family Medicine

## 2018-11-11 ENCOUNTER — Encounter: Payer: Self-pay | Admitting: Family Medicine

## 2018-11-11 ENCOUNTER — Telehealth: Payer: Self-pay | Admitting: Family Medicine

## 2018-11-11 ENCOUNTER — Other Ambulatory Visit: Payer: Self-pay

## 2018-11-11 ENCOUNTER — Ambulatory Visit (HOSPITAL_BASED_OUTPATIENT_CLINIC_OR_DEPARTMENT_OTHER)
Admission: RE | Admit: 2018-11-11 | Discharge: 2018-11-11 | Disposition: A | Discharge status: Home / Self Care | Payer: BC Managed Care – PPO | Source: Ambulatory Visit | Attending: Family Medicine | Admitting: Family Medicine

## 2018-11-11 ENCOUNTER — Ambulatory Visit (INDEPENDENT_AMBULATORY_CARE_PROVIDER_SITE_OTHER): Payer: BC Managed Care – PPO | Admitting: Family Medicine

## 2018-11-11 VITALS — BP 109/76 | HR 68 | Ht 65.0 in | Wt 181.0 lb

## 2018-11-11 DIAGNOSIS — R059 Cough, unspecified: Secondary | ICD-10-CM

## 2018-11-11 DIAGNOSIS — R05 Cough: Secondary | ICD-10-CM | POA: Insufficient documentation

## 2018-11-11 DIAGNOSIS — J42 Unspecified chronic bronchitis: Secondary | ICD-10-CM | POA: Insufficient documentation

## 2018-11-11 DIAGNOSIS — J410 Simple chronic bronchitis: Secondary | ICD-10-CM | POA: Diagnosis not present

## 2018-11-11 DIAGNOSIS — R079 Chest pain, unspecified: Secondary | ICD-10-CM | POA: Diagnosis not present

## 2018-11-11 DIAGNOSIS — R5383 Other fatigue: Secondary | ICD-10-CM

## 2018-11-11 DIAGNOSIS — J4489 Other specified chronic obstructive pulmonary disease: Secondary | ICD-10-CM | POA: Insufficient documentation

## 2018-11-11 MED ORDER — LEVOCETIRIZINE DIHYDROCHLORIDE 5 MG PO TABS: 5.0000 mg | ORAL_TABLET | Freq: Every evening | ORAL | 11 refills | Status: DC

## 2018-11-11 MED ORDER — DOXYCYCLINE HYCLATE 100 MG PO TABS: 100.0000 mg | ORAL_TABLET | Freq: Two times a day (BID) | ORAL | 0 refills | Status: DC

## 2018-11-11 MED ORDER — IPRATROPIUM BROMIDE 0.06 % NA SOLN: 2.0000 | Freq: Four times a day (QID) | NASAL | 12 refills | Status: DC

## 2018-11-11 MED ORDER — LOSARTAN POTASSIUM 50 MG PO TABS: 50.0000 mg | ORAL_TABLET | Freq: Every day | ORAL | 0 refills | Status: DC

## 2018-11-11 MED ORDER — PREDNISONE 50 MG PO TABS: 50.0000 mg | ORAL_TABLET | Freq: Every day | ORAL | 0 refills | Status: DC

## 2018-11-11 MED ORDER — FLUTICASONE PROPIONATE 50 MCG/ACT NA SUSP: 2.0000 | Freq: Every day | NASAL | 6 refills | Status: DC

## 2018-11-11 NOTE — Telephone Encounter (Signed)
Discussed cxr with patient. Clear - no signs of infection. -DC lisinopril, start losartan 50 mg daily.  Monitor blood pressures over 2 weeks and if above 130/80 she will increase to losartan 75 mg daily. -Start allergy regimen and continue.  Start Xyzal 5 mg nightly, Flonase nasal spray daily, Atrovent nasal spray nightly and up to 4 times a day as needed. -See cycling twice daily x10 days and prednisone 50 mg x 5 days. -Follow-up in 3-4 weeks.  Reported understanding and all the above directions and her chest x-ray findings. Medications called in for her today.

## 2018-11-11 NOTE — Progress Notes (Signed)
VIRTUAL VISIT VIA VIDEO  I connected with Rebecca Pollard on 11/11/18 at 10:45 AM EDT by a video enabled telemedicine application and verified that I am speaking with the correct person using two identifiers. Location patient: Home Location provider: [facility], Office Persons participating in the virtual visit: Patient, Dr. Raoul Pitch and R.Baker, LPN  I discussed the limitations of evaluation and management by telemedicine and the availability of in person appointments. The patient expressed understanding and agreed to proceed.   SUBJECTIVE Chief Complaint  Patient presents with  . Cough    Pt states cough comes and goes x5 months, dry cough. When patient is laying flat it feels like she is choking at times. Pt has not taken temp and SOB. Pt has been working from home, nobody else sick in the home. Pt is taking a old medication for UTI, Bactrim but only had three days supply    . Fatigue    HPI: Rebecca Pollard is a 58 y.o. present for  Cough/fatigue/chronic bronchitis: Patient reports she has had 5 months of a dry cough.  She states it is still thick it feels like she is choking at times.  SHe feels that she has thick phlegm in her throat but she is unable to bring it up.  She reports that she feels like her chest can be tight but denies shortness of breath.  Is felt feverish but has not taken her temperature.  For the last week it has worsened and it feels like a sharp cough.  She states she can "smell and infection ".  The discomfort is mostly on the right side of her lungs.  She states she has been told she has had chronic bronchitis in the past secondary to industrial exposure.  She was started on lisinopril December 2019 for her blood pressure.  She had a few bouts of sinus infections and cough the beginning of 2020 and had multiple rounds of antibiotics and steroids.  She is not taking an antihistamine.  She states she has an old prescription of Bactrim so she started taking the  Bactrim the last 3 days.  ROS: See pertinent positives and negatives per HPI.  Patient Active Problem List   Diagnosis Date Noted  . Essential hypertension 07/25/2018  . Sinusitis 07/22/2018  . Vitamin D deficiency 09/28/2016  . Arthralgia 09/28/2016  . 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 Postmenopausal 07/29/2015  . Encounter to establish care 05/31/2015    Social History   Tobacco Use  . Smoking status: Never Smoker  . Smokeless tobacco: Never Used  Substance Use Topics  . Alcohol use: Not Currently    Comment: rare    Current Outpatient Medications:  .  albuterol (PROVENTIL HFA;VENTOLIN HFA) 108 (90 Base) MCG/ACT inhaler, Inhale 1-2 puffs into the lungs every 6 (six) hours as needed for wheezing or shortness of breath., Disp: 1 Inhaler, Rfl: 0 .  DHEA 10 MG TABS, Take by mouth., Disp: , Rfl:  .  lisinopril (PRINIVIL,ZESTRIL) 30 MG tablet, Take 1 tablet (30 mg total) by mouth daily., Disp: 90 tablet, Rfl: 1 .  Multiple Vitamins-Minerals (MULTIVITAMIN WITH MINERALS) tablet, Take 1 tablet by mouth daily., Disp: , Rfl:  .  vitamin B-12 (CYANOCOBALAMIN) 100 MCG tablet, Take 100 mcg by mouth daily., Disp: , Rfl:  .  Cholecalciferol (VITAMIN D3) 2400 UNIT/ML LIQD, 2,400 Units by Does not apply route daily., Disp: , Rfl:  .  co-enzyme Q-10 30 MG capsule, Take 30 mg by mouth 3 (three) times  daily., Disp: , Rfl:  .  methylPREDNISolone (MEDROL) 4 MG tablet, Take 4 mg by mouth daily., Disp: , Rfl:   No Known Allergies  OBJECTIVE: BP 109/76   Pulse 68   Ht 5\' 5"  (1.651 m)   Wt 181 lb (82.1 kg)   LMP 04/16/2016 (Approximate)   BMI 30.12 kg/m  Gen: No acute distress. Nontoxic in appearance.  HENT: AT. [place].  MMM.  Eyes:Pupils Equal Round Reactive to light, Extraocular movements intact,  Conjunctiva without redness, discharge or icterus. CV: No edema Chest: Cough or shortness of breath not present on exam Skin: No rashes, purpura or petechiae.  Neuro:  Normal gait. Alert. Oriented x3   Psych: Normal affect, dress and demeanor. Normal speech. Normal thought content and judgment.  ASSESSMENT AND PLAN: [name] is a 58 y.o. female present for  Cough/Fatigue, unspecified type/chronic bronchitis Has a history of sinusitis and allergies.  Not currently on an antihistamine.  With a cough of 5 months duration and is worsening over the last week.  No known COVID-19 exposure.  She does admit she thinks she had COVID-19 in March. -We will obtain chest x-ray today and patient will be called with results and plan.  If chest x-ray is normal would change her lisinopril to losartan and consider referral to asthma/allergy or pulmonology. - DG Chest 2 View; Future Follow-up dependent upon chest x-ray.   > 25 minutes spent with patient, >50% of time spent face to face    Howard Pouch, DO 11/11/2018

## 2018-11-16 ENCOUNTER — Ambulatory Visit: Payer: BLUE CROSS/BLUE SHIELD | Admitting: Family Medicine

## 2018-11-25 ENCOUNTER — Other Ambulatory Visit: Payer: Self-pay

## 2018-11-25 ENCOUNTER — Encounter (HOSPITAL_COMMUNITY): Payer: Self-pay | Admitting: Emergency Medicine

## 2018-11-25 ENCOUNTER — Ambulatory Visit (HOSPITAL_COMMUNITY)
Admission: EM | Admit: 2018-11-25 | Discharge: 2018-11-25 | Disposition: A | Discharge status: Home / Self Care | Payer: BC Managed Care – PPO | Attending: Family Medicine | Admitting: Family Medicine

## 2018-11-25 ENCOUNTER — Ambulatory Visit (INDEPENDENT_AMBULATORY_CARE_PROVIDER_SITE_OTHER)
Admission: RE | Admit: 2018-11-25 | Discharge: 2018-11-25 | Disposition: A | Discharge status: Home / Self Care | Payer: BC Managed Care – PPO | Source: Ambulatory Visit

## 2018-11-25 DIAGNOSIS — R8290 Unspecified abnormal findings in urine: Secondary | ICD-10-CM

## 2018-11-25 DIAGNOSIS — B659 Schistosomiasis, unspecified: Secondary | ICD-10-CM | POA: Insufficient documentation

## 2018-11-25 LAB — POCT URINALYSIS DIP (DEVICE)
Bilirubin Urine: NEGATIVE
Glucose, UA: NEGATIVE mg/dL
Ketones, ur: NEGATIVE mg/dL
Leukocytes,Ua: NEGATIVE
Nitrite: NEGATIVE
Protein, ur: NEGATIVE mg/dL
Specific Gravity, Urine: 1.02 (ref 1.005–1.030)
Urobilinogen, UA: 0.2 mg/dL (ref 0.0–1.0)
pH: 5.5 (ref 5.0–8.0)

## 2018-11-25 LAB — CBC WITH DIFFERENTIAL/PLATELET
Abs Immature Granulocytes: 0.04 10*3/uL (ref 0.00–0.07)
Basophils Absolute: 0 10*3/uL (ref 0.0–0.1)
Basophils Relative: 0 %
Eosinophils Absolute: 0.1 10*3/uL (ref 0.0–0.5)
Eosinophils Relative: 2 %
HCT: 47.7 % — ABNORMAL HIGH (ref 36.0–46.0)
Hemoglobin: 15.8 g/dL — ABNORMAL HIGH (ref 12.0–15.0)
Immature Granulocytes: 0 %
Lymphocytes Relative: 21 %
Lymphs Abs: 2 10*3/uL (ref 0.7–4.0)
MCH: 32 pg (ref 26.0–34.0)
MCHC: 33.1 g/dL (ref 30.0–36.0)
MCV: 96.6 fL (ref 80.0–100.0)
Monocytes Absolute: 0.8 10*3/uL (ref 0.1–1.0)
Monocytes Relative: 8 %
Neutro Abs: 6.5 10*3/uL (ref 1.7–7.7)
Neutrophils Relative %: 69 %
Platelets: 245 10*3/uL (ref 150–400)
RBC: 4.94 MIL/uL (ref 3.87–5.11)
RDW: 12.2 % (ref 11.5–15.5)
WBC: 9.4 10*3/uL (ref 4.0–10.5)
nRBC: 0 % (ref 0.0–0.2)

## 2018-11-25 MED ORDER — ALBENDAZOLE 200 MG PO TABS: 400.0000 mg | ORAL_TABLET | Freq: Once | ORAL | 0 refills | Status: AC

## 2018-11-25 NOTE — Discharge Instructions (Addendum)
Medication as prescribed. Follow-up with ID should your symptoms persist, worsen over the next week for further evaluation.

## 2018-11-25 NOTE — ED Triage Notes (Signed)
Pt here for dysuria and "feels like she has a worm coming out when she pees" x 3 days

## 2018-11-25 NOTE — Discharge Instructions (Addendum)
Patient advised to come to office for exam.

## 2018-11-25 NOTE — ED Provider Notes (Signed)
Virtual Visit via Video Note:  Rebecca Pollard  initiated request for Telemedicine visit with [facility] team. I connected with Rebecca Pollard  on 11/25/2018 at 11:41 AM  for a synchronized telemedicine visit using a video enabled HIPPA compliant telemedicine application. I verified that I am speaking with Rebecca Pollard  using two identifiers. [place] Hall-Potvin, PA-C  was physically located in a [facility] site and Marchia Diguglielmo was located at a different location.   The limitations of evaluation and management by telemedicine as well as the availability of in-person appointments were discussed. Patient was informed that she  may incur a bill ( including co-pay) for this virtual visit encounter. Rebecca Pollard  expressed understanding and gave verbal consent to proceed with virtual visit.     History of Present Illness:Rebecca Pollard  is a 58 y.o. female presents with a concern of worms in her urine.  Has been happening for the last 3 days with her first void.  Patient states she was able to collect specimen, has not in a jar" is still ".  Due to chief complaint, recommended that patient be evaluated in office, bring specimen.  See office visit note from today, 7/3.  Past Medical History:  Diagnosis Date  . Asthma   . Chronic bronchitis ([place])   . Chronic fatigue   . Fibromyalgia   . GERD (gastroesophageal reflux disease)   . Glaucoma   . Hypertension 2019  . IBS (irritable bowel syndrome)   . Osteopenia 09/2016   T score -1.3 FRAX 5.3%/0.3%  . Palpitations    echo and stress test normal 2015 per pt  . Urinary incontinence     No Known Allergies      Observations/Objective: 58 year old female sitting in no acute distress.  Assessment and Plan: Patient complaining of warm in her urine, recommended patient bring specimen in, in office for urinalysis as well as further evaluation.  Follow Up Instructions: To be seen today in urgent care.   I discussed the  assessment and treatment plan with the patient. The patient was provided an opportunity to ask questions and all were answered. The patient agreed with the plan and demonstrated an understanding of the instructions.   The patient was advised to call back or seek an in-person evaluation if the symptoms worsen or if the condition fails to improve as anticipated.  I provided 10 minutes of non-face-to-face time during this encounter.    [place] Hall-Potvin, PA-C  11/25/2018 11:41 AM        Hall-Potvin, [place], PA-C 11/26/18 1026

## 2018-11-25 NOTE — ED Provider Notes (Signed)
[facility]    CSN: [id number] Arrival date & time: 11/25/18  1411     History   Chief Complaint Chief Complaint  Patient presents with  . Dysuria    appt 2:10    HPI Rebecca Pollard is a 58 y.o. female with history of hypertension, chronic fatigue, or myalgia, hemoglobinuria presenting for possible worm in her urine.  Patient states that during her first void of the morning she feels like she has a worm coming out when she urinates.  Patient states that for the last 3 days she has visualized 82 cm, black worm in the commode.  Patient denies defecating during these episodes.  Patient denies fever, myalgias, abdominal or flank pain, pelvic pain, vaginal discharge, anogenital lesions, urinary frequency/urgency, burning with urination.  Patient does not consume meat, has traveled to first world countries over the years, lived in [place] greater than 5 years ago.  Patient admits to having pinworms as a child.  Patient denies anal pruritus, irritation, change in bowel habits, diarrhea.    Past Medical History:  Diagnosis Date  . Asthma   . Chronic bronchitis ([place])   . Chronic fatigue   . Fibromyalgia   . GERD (gastroesophageal reflux disease)   . Glaucoma   . Hypertension 2019  . IBS (irritable bowel syndrome)   . Osteopenia 09/2016   T score -1.3 FRAX 5.3%/0.3%  . Palpitations    echo and stress test normal 2015 per pt  . Urinary incontinence     Patient Active Problem List   Diagnosis Date Noted  . Chronic bronchitis ([place])   . Essential hypertension 07/25/2018  . Sinusitis 07/22/2018  . Vitamin D deficiency 09/28/2016  . Arthralgia 09/28/2016  . 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 Postmenopausal 07/29/2015  . Encounter to establish care 05/31/2015    Past Surgical History:  Procedure Laterality Date  . CHOLECYSTECTOMY    . TRANSTHORACIC ECHOCARDIOGRAM  11/2013   EF 50-55%, normal LV function, normal wall motion, triavial MR and mild tricuspid regurg.    OB  History    Gravida  3   Para  3   Term      Preterm      AB      Living  3     SAB      TAB      Ectopic      Multiple      Live Births               Home Medications    Prior to Admission medications   Medication Sig Start Date End Date Taking? Authorizing Provider  albendazole (ALBENZA) 200 MG tablet Take 2 tablets (400 mg total) by mouth once for 1 dose. 11/25/18 11/25/18  Hall-Potvin, [place], PA-C  albuterol (PROVENTIL HFA;VENTOLIN HFA) 108 (90 Base) MCG/ACT inhaler Inhale 1-2 puffs into the lungs every 6 (six) hours as needed for wheezing or shortness of breath. 05/02/18   Kuneff, Renee A, DO  Cholecalciferol (VITAMIN D3) 2400 UNIT/ML LIQD 2,400 Units by Does not apply route daily.    [provider]  co-enzyme Q-10 30 MG capsule Take 30 mg by mouth 3 (three) times daily.    [provider]  DHEA 10 MG TABS Take by mouth.    [provider]  doxycycline (VIBRA-TABS) 100 MG tablet Take 1 tablet (100 mg total) by mouth 2 (two) times daily. Patient not taking: Reported on 11/25/2018 11/11/18   Howard Pouch A, DO  fluticasone (FLONASE) 50  MCG/ACT nasal spray Place 2 sprays into both nostrils daily. 11/11/18   Kuneff, Renee A, DO  ipratropium (ATROVENT) 0.06 % nasal spray Place 2 sprays into both nostrils 4 (four) times daily. 11/11/18   Kuneff, Renee A, DO  levocetirizine (XYZAL) 5 MG tablet Take 1 tablet (5 mg total) by mouth every evening. 11/11/18   Kuneff, Renee A, DO  losartan (COZAAR) 50 MG tablet Take 1 tablet (50 mg total) by mouth daily. 11/11/18   Kuneff, Renee A, DO  Multiple Vitamins-Minerals (MULTIVITAMIN WITH MINERALS) tablet Take 1 tablet by mouth daily.    [provider]  predniSONE (DELTASONE) 50 MG tablet Take 1 tablet (50 mg total) by mouth daily with breakfast. Patient not taking: Reported on 11/25/2018 11/11/18   Kuneff, Renee A, DO  vitamin B-12 (CYANOCOBALAMIN) 100 MCG tablet Take 100 mcg by mouth daily.    [provider]    Family History Family History  Problem Relation Age of Onset  . Hearing loss Mother   . Arthritis Father   . Hearing loss Father   . Stroke Father   . Breast cancer Paternal Grandmother   . Leukemia Maternal Uncle     Social History Social History   Tobacco Use  . Smoking status: Never Smoker  . Smokeless tobacco: Never Used  Substance Use Topics  . Alcohol use: Not Currently    Comment: rare  . Drug use: No     Allergies   Patient has no known allergies.   Review of Systems As per HPI   Physical Exam Triage Vital Signs ED Triage Vitals [11/25/18 1434]  Enc Vitals Group     BP (!) 138/98     Pulse Rate 86     Resp 18     Temp (!) 97.3 F (36.3 C)     Temp Source Oral     SpO2 97 %     Weight      Height      Head Circumference      Peak Flow      Pain Score 0     Pain Loc      Pain Edu?      Excl. in ?    No data found.  Updated Vital Signs BP (!) 138/98 (BP Location: Right Arm)   Pulse 86   Temp (!) 97.3 F (36.3 C) (Oral)   Resp 18   LMP 04/16/2016 (Approximate)   SpO2 97%   Visual Acuity Right Eye Distance:   Left Eye Distance:   Bilateral Distance:    Right Eye Near:   Left Eye Near:    Bilateral Near:     Physical Exam Constitutional:      General: She is not in acute distress. HENT:     Head: Normocephalic and atraumatic.     Right Ear: Tympanic membrane, ear canal and external ear normal.     Left Ear: Tympanic membrane, ear canal and external ear normal.     Mouth/Throat:     Mouth: Mucous membranes are moist.     Pharynx: Oropharynx is clear. No posterior oropharyngeal erythema.  Eyes:     General: No scleral icterus.    Conjunctiva/sclera: Conjunctivae normal.     Pupils: Pupils are equal, round, and reactive to light.  Neck:     Musculoskeletal: Neck supple. No muscular tenderness.  Cardiovascular:     Rate and Rhythm: Normal rate.  Pulmonary:     Effort: Pulmonary effort is normal.  Abdominal:     General: Abdomen is flat. Bowel sounds are normal. There is no distension.     Tenderness: There is no abdominal tenderness. There is no right CVA tenderness, left CVA tenderness or guarding.  Lymphadenopathy:     Cervical: No cervical adenopathy.  Skin:    General: Skin is warm.     Capillary Refill: Capillary refill takes less than 2 seconds.     Coloration: Skin is not jaundiced or pale.     Findings: No bruising or rash.  Neurological:     General: No focal deficit present.     Mental Status: She is alert and oriented to person, place, and time.      UC Treatments / Results  Labs (all labs ordered are listed, but only abnormal results are displayed) Labs Reviewed  POCT URINALYSIS DIP (DEVICE) - Abnormal; Notable for the following components:      Result Value   Hgb urine dipstick SMALL (*)    All other components within normal limits  CBC WITH DIFFERENTIAL/PLATELET    EKG   Radiology No results found.  Procedures Procedures (including critical care time)  Medications Ordered in UC Medications - No data to display  Initial Impression / Assessment and Plan / UC Course  I have reviewed the triage vital signs and the nursing notes.  Pertinent labs & imaging results that were available during my care of the patient were reviewed by me and considered in my medical decision making (see chart for details).     58 year old female with history of hypertension, fibromyalgia, hemoglobinuria presenting for worm in her urine.  Patient was able to bring sample with her, 2 to 2.5 cm thin worm noted.  Urinalysis showing small amount hemoglobin without leukocytes or nitrates.  CBC pending.  Will treat for schistosomiasis with albendazole.  Instructed patient to follow-up with ID should symptoms not improve, or worsen.  Patient is agreeable to plan, verbalized understanding of return precautions. Final Clinical Impressions(s) / UC Diagnoses   Final diagnoses:   Schistosomiasis     Discharge Instructions     Medication as prescribed. Follow-up with ID should your symptoms persist, worsen over the next week for further evaluation.    ED Prescriptions    Medication Sig Dispense Auth. Provider   albendazole (ALBENZA) 200 MG tablet Take 2 tablets (400 mg total) by mouth once for 1 dose. 2 tablet Hall-Potvin, [place], PA-C     Controlled Substance Prescriptions [place] Controlled Substance Registry consulted? Not Applicable   Quincy Sheehan, [place] 11/25/18 1535

## 2018-11-28 ENCOUNTER — Telehealth (HOSPITAL_COMMUNITY): Payer: Self-pay | Admitting: Emergency Medicine

## 2018-11-28 NOTE — Telephone Encounter (Signed)
Unremarkable labs. Patient contacted and made aware of all results, all questions answered.

## 2018-12-12 ENCOUNTER — Telehealth: Payer: Self-pay | Admitting: Internal Medicine

## 2018-12-12 NOTE — Telephone Encounter (Signed)
COVID-19 Pre-Screening Questions:12/12/18  Do you currently have a fever (>100 F), chills or unexplained body aches?NO  Are you currently experiencing new cough, shortness of breath, sore throat, runny nose?NO   Have you recently travelled outside the state of [place] in the last 14 days? NO   Have you been in contact with someone that is currently pending confirmation of Covid19 testing or has been confirmed to have the [place] virus?  NO  **If the patient answers NO to ALL questions -  advise the patient to please call the clinic before coming to the office should any symptoms develop.

## 2018-12-13 ENCOUNTER — Ambulatory Visit (INDEPENDENT_AMBULATORY_CARE_PROVIDER_SITE_OTHER): Payer: BC Managed Care – PPO | Admitting: Internal Medicine

## 2018-12-13 ENCOUNTER — Other Ambulatory Visit: Payer: Self-pay

## 2018-12-13 ENCOUNTER — Other Ambulatory Visit: Payer: Self-pay | Admitting: Internal Medicine

## 2018-12-13 DIAGNOSIS — B8781 Genitourinary myiasis: Secondary | ICD-10-CM | POA: Insufficient documentation

## 2018-12-13 NOTE — Progress Notes (Addendum)
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  She is fairly certain that they came from her urine and were not present in her clothing or in the toilet bowl before she urinated.  Although this would be quite rare in the [place] it seems possible that she may have a rare case of urogenital myiasis, which can occur when various species of Diptern fly eggs contaminate human tissue.  I will see if we can have a pathologic review for ova and parasites at [facility].  I spoke to Dr. Thressa Sheller, 1 of our pathologist here, who made that recommendation.  Plan: 1. Ova and parasite review of specimen (I will call her when I have a report)  Patient Active Problem List   Diagnosis Date Noted  . Chronic bronchitis ([facility])   . Essential hypertension 07/25/2018  . Sinusitis 07/22/2018  . Vitamin D deficiency 09/28/2016  . Arthralgia 09/28/2016  . 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 Postmenopausal 07/29/2015  . Encounter to establish care 05/31/2015    Patient's Medications  New Prescriptions   No medications on file  Previous Medications   ALBUTEROL (PROVENTIL HFA;VENTOLIN HFA) 108 (90 BASE) MCG/ACT INHALER    Inhale 1-2 puffs into the lungs every 6 (six) hours as needed for wheezing or shortness of breath.   CHOLECALCIFEROL (VITAMIN D3) 2400 UNIT/ML LIQD    2,400 Units by Does not apply route daily.   CO-ENZYME Q-10 30 MG CAPSULE    Take 30 mg by mouth 3 (three) times daily.   DHEA 10 MG TABS    Take by mouth.   DOXYCYCLINE (VIBRA-TABS) 100 MG TABLET    Take 1 tablet (100 mg total) by mouth 2 (two) times daily.   FLUTICASONE (FLONASE) 50 MCG/ACT NASAL SPRAY    Place 2 sprays into both nostrils daily.   IPRATROPIUM (ATROVENT) 0.06 % NASAL SPRAY    Place 2 sprays into both nostrils 4 (four) times daily.   LEVOCETIRIZINE (XYZAL) 5  MG TABLET    Take 1 tablet (5 mg total) by mouth every evening.   LOSARTAN (COZAAR) 50 MG TABLET    Take 1 tablet (50 mg total) by mouth daily.   MULTIPLE VITAMINS-MINERALS (MULTIVITAMIN WITH MINERALS) TABLET    Take 1 tablet by mouth daily.   PREDNISONE (DELTASONE) 50 MG TABLET    Take 1 tablet (50 mg total) by mouth daily with breakfast.   VITAMIN B-12 (CYANOCOBALAMIN) 100 MCG TABLET    Take 100 mcg by mouth daily.  Modified Medications   No medications on file  Discontinued Medications   No medications on file    HPI: Rebecca Pollard is a 58 y.o. female who was in her usual state of good health until about 2 weeks ago.  She woke up one morning and went to the bathroom to pass her urine.  She noted some dark spots in the toilet bowl.  Initially, she was not concerned but the same thing happened the following 2 mornings prompting her to come to the emergency department on 11/25/2018.  She brought a specimen with her.  In the ED note it was described as a 2 to 2.5 cm thin worm.  The specimen was not sent for microscopic review.  Her urinalysis showed only small amount of hemoglobin without leukocytes or nitrates.  No urine culture was obtained.  Her  white blood cell count was normal and there was no eosinophilia.  She had no symptoms of urinary tract infection, skin rash, itching, nausea, vomiting, diarrhea or fever.  She was treated with 1 dose of albendazole.  She did not notice any return of the problems with urination until yesterday morning when the same thing happened again.  She brings in a new specimen today.  Her only international travel has been to [place] and that was many years ago.  She does not work outside of the home.  She is an avid gardener.  Her daughter got a dog from the pound about 6 weeks ago and the dog has licked her frequently.  The dog seems healthy.  Review of Systems: Review of Systems  Constitutional: Positive for weight loss. Negative for chills, diaphoresis, fever and  malaise/fatigue.       She has been trying to lose weight.  HENT: Negative for congestion and sore throat.   Respiratory: Negative for cough, sputum production and shortness of breath.   Cardiovascular: Negative for chest pain.  Gastrointestinal: Negative for abdominal pain, diarrhea, nausea and vomiting.  Genitourinary: Negative for dysuria, frequency, hematuria and urgency.  Musculoskeletal: Positive for back pain.       She has noted some mild right back pain recently.  Neurological: Negative for headaches.      Past Medical History:  Diagnosis Date  . Asthma   . Chronic bronchitis ([facility])   . Chronic fatigue   . Fibromyalgia   . GERD (gastroesophageal reflux disease)   . Glaucoma   . Hypertension 2019  . IBS (irritable bowel syndrome)   . Osteopenia 09/2016   T score -1.3 FRAX 5.3%/0.3%  . Palpitations    echo and stress test normal 2015 per pt  . Urinary incontinence     Social History   Tobacco Use  . Smoking status: Never Smoker  . Smokeless tobacco: Never Used  Substance Use Topics  . Alcohol use: Not Currently    Comment: rare  . Drug use: No    Family History  Problem Relation Age of Onset  . Hearing loss Mother   . Arthritis Father   . Hearing loss Father   . Stroke Father   . Breast cancer Paternal Grandmother   . Leukemia Maternal Uncle    No Known Allergies  OBJECTIVE: There were no vitals filed for this visit. There is no height or weight on file to calculate BMI.   Physical Exam   There were 3 distinct black objects in the specimen jar that she brought in containing water and urine she collected from the toilet yesterday.  In the water were 2 thin, dark objects about 1 cm long.  Both appeared to be motile at first (above).  There was also a small black fly clinging to the inside of the jar (below).      Microbiology: No results found for this or any previous visit (from the past 240 hour(s)).  Michel Bickers, MD [facility] for  Infectious [facility] [phone] pager   [phone] cell 12/13/2018, 2:36 PM

## 2018-12-14 ENCOUNTER — Telehealth: Payer: Self-pay

## 2018-12-14 NOTE — Telephone Encounter (Signed)
Relayed to patient.

## 2018-12-14 NOTE — Telephone Encounter (Signed)
I see no need for her to bring that specimen in.  The specimen that she brought in yesterday has gone to [facility].

## 2018-12-14 NOTE — Telephone Encounter (Signed)
Patient called office today stating she was able to obtain a live parasite last night during urination. Patient would like to know if Dr. Megan Salon would like for patient to bring it in today for labs. Will forward message to MD to advise. [place]

## 2018-12-16 ENCOUNTER — Other Ambulatory Visit: Payer: Self-pay | Admitting: Internal Medicine

## 2018-12-16 ENCOUNTER — Telehealth: Payer: Self-pay | Admitting: *Deleted

## 2018-12-16 ENCOUNTER — Telehealth: Payer: Self-pay | Admitting: Internal Medicine

## 2018-12-16 DIAGNOSIS — B8781 Genitourinary myiasis: Secondary | ICD-10-CM

## 2018-12-16 MED ORDER — IVERMECTIN 3 MG PO TABS: ORAL_TABLET | ORAL | 0 refills | Status: DC

## 2018-12-16 NOTE — Telephone Encounter (Signed)
Rebecca Pollard called this morning to report a change in symptoms.  She voided 3 worms this morning (2 small that floated to the top, 1 long and heavy that sunk to the bottom).  This is an increase, as she typically would only have 1 if any appear in her urine. She is asking if Dr Megan Salon would send in a prescription to stem this increase while waiting for results. She uses [facility] [address] in [facility]. Landis Gandy, RN

## 2018-12-16 NOTE — Telephone Encounter (Signed)
  [place] called this morning to report a change in symptoms.  She voided 3 worms this morning (2 small that floated to the top, 1 long and heavy that sunk to the bottom).  This is an increase, as she typically would only have 1 if any appear in her urine. She is asking if Dr Megan Salon would send in a prescription to stem this increase while waiting for results. [facility] uses [facility] [address] in [facility]. Landis Gandy, RN       Documentation     Verlee Rossetti, RN    I called Olivia Mackie this morning.  She said that she became very anxious and "freaked out" when she passed 3 small black "worms" similar to the previous ones this morning she is very concerned that it seems to be increasing in number each day.  I told her that I still was not at all certain what this was caused by.  The testing from [place] is still pending.  If this is the very rare illness called uro-genital myiasis (tissue invasion by a larval forms of a variety of species of Dipteran flies) there are some case reports that ivermectin may be of benefit.  I will send in a prescription for ivermectin.  I did emphasize to her that this is, at the very best, a mildly educated guess.

## 2018-12-22 ENCOUNTER — Telehealth: Payer: Self-pay | Admitting: Internal Medicine

## 2018-12-22 NOTE — Telephone Encounter (Signed)
I talked to Rebecca Pollard today.  She tells me that she remains extremely tired and can sleep for 10 to 12 hours a day.  Occasionally she feels hot, almost like a hot flash but does not think that she is having any fever.  She is stopped passing a "black worms" after she took the ivermectin.  So far she has not had any recurrence.  She is aware that I am considering the extremely rare condition called genitourinary myiasis.  I told her that, unfortunately, [facility] has not given [place] any results on the ova and parasite examination on the specimen she left [place].  We spoke with them today and they said it would not be back until at least 12/26/2018.

## 2018-12-26 LAB — OVA + PARASITE EXAM, URINE

## 2018-12-31 ENCOUNTER — Other Ambulatory Visit: Payer: Self-pay | Admitting: Obstetrics & Gynecology

## 2019-01-02 ENCOUNTER — Other Ambulatory Visit: Payer: Self-pay | Admitting: Internal Medicine

## 2019-01-02 ENCOUNTER — Telehealth: Payer: Self-pay | Admitting: Internal Medicine

## 2019-01-02 NOTE — Telephone Encounter (Signed)
I spoke to Rebecca Pollard today by phone.  I let her know that her recent urine sample did not reveal any ova or parasites to explain her recent symptoms.  Fortunately, she says that she is feeling a little better each day.  She has stopped passing the black "worms" after her 3-day course of ivermectin.  She continues to have some hot flashes but does not feel like she is having some fever.  She did have some right-sided flank pain at the tail end of her ivermectin therapy and wonders if it is coming from her right kidney.  She is not having any dysuria.  She plans on seeing her PCP soon.  I told her that the cause of her recent symptoms remains unclear.  She remains very concerned about genitourinary myiasis and has been doing a lot of reading.  If she has a recurrence I would repeat a UA, urine culture and strongly consider renal ultrasound and urology referral.  I asked her to let me know if she has any further concerns or evidence of recurrence.

## 2019-01-17 ENCOUNTER — Other Ambulatory Visit: Payer: Self-pay

## 2019-01-17 ENCOUNTER — Ambulatory Visit: Payer: BC Managed Care – PPO | Admitting: Family Medicine

## 2019-01-17 VITALS — BP 117/80 | HR 76 | Temp 97.3°F | Resp 17 | Ht 65.0 in | Wt 180.4 lb

## 2019-01-17 DIAGNOSIS — B89 Unspecified parasitic disease: Secondary | ICD-10-CM | POA: Diagnosis not present

## 2019-01-17 DIAGNOSIS — R233 Spontaneous ecchymoses: Secondary | ICD-10-CM

## 2019-01-17 DIAGNOSIS — R194 Change in bowel habit: Secondary | ICD-10-CM

## 2019-01-17 DIAGNOSIS — Z23 Encounter for immunization: Secondary | ICD-10-CM | POA: Diagnosis not present

## 2019-01-17 DIAGNOSIS — H409 Unspecified glaucoma: Secondary | ICD-10-CM | POA: Insufficient documentation

## 2019-01-17 DIAGNOSIS — R109 Unspecified abdominal pain: Secondary | ICD-10-CM

## 2019-01-17 DIAGNOSIS — R238 Other skin changes: Secondary | ICD-10-CM

## 2019-01-17 DIAGNOSIS — K909 Intestinal malabsorption, unspecified: Secondary | ICD-10-CM | POA: Diagnosis not present

## 2019-01-17 DIAGNOSIS — B8781 Genitourinary myiasis: Secondary | ICD-10-CM

## 2019-01-17 MED ORDER — LOSARTAN POTASSIUM 50 MG PO TABS: 50.0000 mg | ORAL_TABLET | Freq: Every day | ORAL | 0 refills | Status: DC

## 2019-01-17 NOTE — Progress Notes (Signed)
Rebecca Pollard , 1960/09/05, 58 y.o., female MRN: [id number] Patient Care Team    Relationship Specialty Notifications Start End  Ma Hillock, DO PCP - General Family Medicine  05/31/15     Chief Complaint  Patient presents with  . Bleeding/Bruising    Pt is bruising all over x1 month. No trauma. Does not remember bumping into anything.   . Flank Pain    Pt is having right sided flank pain since taking abx that Dr Louis Meckel for the parasite. Pt denies any UTI symptoms.      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  Patient has been seeing infectious disease since noting flies and warmth in her urine since November 25, 2018.  There has been some concern for genitourinary myiasis given her symptoms, however this has not been able to be proven diagnostically as of yet.  Her urine ova and parasite were negative.  There has been flies and worms visualized in her urine.  She was supplied with an ivermectin and doxycycline course in hopes to treat her  condition.  She states she did feel better and has not seen any evidence of parasites since the course of ivermectin/doxycycline.  However she started to experience right flank pain at the end of the course of ivermectin.  She also states she noted she is bruising more easily. In addition to the above she has noticed stool changes since June.  She states she had a oily appearance to her stools in June x1.  Which she attributed to what she had consumed that day was [employer].  Then approximately 2-3 weeks ago she had 2 episodes of "black oil "in her stool.  She has had no stool changes since.  She denies any fever, chills, nausea, vomit, dysuria or current stool changes.  She denies any bleeding from her gums.  She denies any rash.  Depression screen PHQ 2/9 09/17/2017  Decreased Interest 0  Down,  Depressed, Hopeless 0  PHQ - 2 Score 0    No Known Allergies Social History   Social History Narrative   Married to [place]. 2 children, Alice and At[place].    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    Drinks caffeine, herbal remedies, daily vitamin use.    Wears her seatbelt, smoke detector in the home, no firearms in the home.    Feels safe in her relationships.    Exercises 2x a week.    Past Medical History:  Diagnosis Date  . Asthma   . Chronic bronchitis ([place])   . Chronic fatigue   . Fibromyalgia   . GERD (gastroesophageal reflux disease)   . Glaucoma   . Hypertension 2019  . IBS (irritable bowel syndrome)   . Osteopenia 09/2016   T score -1.3 FRAX 5.3%/0.3%  . Palpitations    echo and stress test normal 2015 per pt  . Urinary incontinence    Past Surgical History:  Procedure Laterality Date  . CHOLECYSTECTOMY    . TRANSTHORACIC ECHOCARDIOGRAM  11/2013   EF 50-55%, normal LV function, normal wall motion, triavial MR and mild tricuspid regurg.   Family History  Problem Relation Age of Onset  . Hearing loss Mother   . Arthritis Father   . Hearing loss Father   . Stroke Father   . Breast cancer Paternal Grandmother   . Leukemia Maternal Uncle  Allergies as of 01/17/2019   No Known Allergies     Medication List       Accurate as of January 17, 2019 11:59 PM. If you have any questions, ask your nurse or doctor.        albuterol 108 (90 Base) MCG/ACT inhaler Commonly known as: VENTOLIN HFA Inhale 1-2 puffs into the lungs every 6 (six) hours as needed for wheezing or shortness of breath.   co-enzyme Q-10 30 MG capsule Take 30 mg by mouth 3 (three) times daily.   DHEA 10 MG Tabs Take by mouth.   fluticasone 50 MCG/ACT nasal spray Commonly known as: FLONASE Place 2 sprays into both nostrils daily.   ipratropium 0.06 % nasal spray Commonly known as: Atrovent Place 2 sprays into both nostrils 4 (four) times daily.   levocetirizine 5 MG tablet  Commonly known as: Xyzal Take 1 tablet (5 mg total) by mouth every evening.   losartan 50 MG tablet Commonly known as: COZAAR Take 1 tablet (50 mg total) by mouth daily. What changed: additional instructions   multivitamin with minerals tablet Take 1 tablet by mouth daily.   predniSONE 50 MG tablet Commonly known as: DELTASONE Take 1 tablet (50 mg total) by mouth daily with breakfast.   vitamin B-12 100 MCG tablet Commonly known as: CYANOCOBALAMIN Take 100 mcg by mouth daily.   Vitamin C 500 MG Caps Take 2 tablets by mouth 2 (two) times daily.   Vitamin D3 2400 UNIT/ML Liqd 2,400 Units by Does not apply route daily.       All past medical history, surgical history, allergies, family history, immunizations andmedications were updated in the EMR today and reviewed under the history and medication portions of their EMR.     ROS: Negative, with the exception of above mentioned in HPI   Objective:  BP 117/80 (BP Location: Left Arm, Patient Position: Sitting, Cuff Size: Normal)   Pulse 76   Temp (!) 97.3 F (36.3 C) (Temporal)   Resp 17   Ht 5' 5"  (1.651 m)   Wt 180 lb 6 oz (81.8 kg)   LMP 04/16/2016 (Approximate)   SpO2 96%   BMI 30.02 kg/m  Body mass index is 30.02 kg/m. Gen: Afebrile. No acute distress. Nontoxic in appearance, well developed, well nourished.  HENT: AT. [place].  MMM Eyes:Pupils Equal Round Reactive to light, Extraocular movements intact,  Conjunctiva without redness, discharge or icterus. Neck/lymp/endocrine: Supple, no lymphadenopathy CV: RRR, no edema Chest: CTAB, no wheeze or crackles. Good air movement, normal resp effort.  Abd: Soft.  Flat. NTND. BS present.  No masses palpated. No rebound or guarding.  MSK: No CVA tenderness bilaterally Skin: No rashes, purpura or petechiae.  Neuro:  Normal gait. PERLA. EOMi. Alert. Oriented x3  Psych: Normal affect, dress and demeanor. Normal speech. Normal thought content and judgment.  No exam data present  No results found. No results found for this or any previous visit (from the past 24 hour(s)).  Assessment/Plan: Rebecca Pollard is a 58 y.o. female present for OV for  Parasitic infection/flank pain/Genitourinary myiasis Will collect CBC, CMP, lipase, urinalysis, LDH and renal ultrasound given her ongoing concern for genitourinary myiasis with new flank pain after ivermectin.  Fortunately she seems to have improved since the ivermectin/doxycycline as far as her parasitic infection symptoms go.  However, new right flank pain can be concerning and may need urology consult. - Comp Met (CMET) - CBC w/Diff - US Renal; Future - Urinalysis w microscopic +  reflex cultur - Lactate Dehydrogenase (LDH)  Bowel habit changes/fatty stools -Uncertain etiology.  Uncertain if connected to her possible parasite infection versus other GI concern.  Will start work-up with stool studies and consider GI referral. - Lipase - Ova and parasite examination; Future - Fecal fat, qualitative; Future - Stool, WBC/Lactoferrin; Future - Stool Culture; Future - Lactate Dehydrogenase (LDH) - Stool Culture - Stool, WBC/Lactoferrin - Ova and parasite examination - Fecal fat, qualitative  Easy bruising -Uncertain etiology.  Avoid all NSAIDs. - Protime-INR ( [facility] ONLY) - Lactate Dehydrogenase (LDH)  Need for immunization against influenza - Flu Vaccine QUAD 36+ mos IM    Reviewed expectations re: course of current medical issues.  Discussed self-management of symptoms.  Outlined signs and symptoms indicating need for more acute intervention.  Patient verbalized understanding and all questions were answered.  Patient received an After-Visit Summary.    Orders Placed This Encounter  Procedures  . Ova and parasite examination  . Stool Culture  . Urine Culture  . US Renal  . Flu Vaccine QUAD 36+ mos IM  . Comp Met (CMET)  . CBC w/Diff  . Lipase  . Fecal fat, qualitative  . Stool, WBC/Lactoferrin   . Urinalysis w microscopic + reflex cultur  . Protime-INR ( [facility] ONLY)  . Lactate Dehydrogenase (LDH)  . REFLEXIVE URINE CULTURE   > 25 minutes spent with patient, >50% of time spent face to face     Note is dictated utilizing voice recognition software. Although note has been proof read prior to signing, occasional typographical errors still can be missed. If any questions arise, please do not hesitate to call for verification.   electronically signed by:  Howard Pouch, DO  [place]

## 2019-01-17 NOTE — Patient Instructions (Signed)
We will call you as we get the results back.  They will call you to schedule the Ultrasound.

## 2019-01-18 DIAGNOSIS — K909 Intestinal malabsorption, unspecified: Secondary | ICD-10-CM | POA: Diagnosis not present

## 2019-01-18 DIAGNOSIS — R194 Change in bowel habit: Secondary | ICD-10-CM | POA: Diagnosis not present

## 2019-01-18 LAB — CBC WITH DIFFERENTIAL/PLATELET
Basophils Absolute: 0 10*3/uL (ref 0.0–0.1)
Basophils Relative: 0.2 % (ref 0.0–3.0)
Eosinophils Absolute: 0.1 10*3/uL (ref 0.0–0.7)
Eosinophils Relative: 0.9 % (ref 0.0–5.0)
HCT: 49 % — ABNORMAL HIGH (ref 36.0–46.0)
Hemoglobin: 16.2 g/dL — ABNORMAL HIGH (ref 12.0–15.0)
Lymphocytes Relative: 18.6 % (ref 12.0–46.0)
Lymphs Abs: 1.6 10*3/uL (ref 0.7–4.0)
MCHC: 33.1 g/dL (ref 30.0–36.0)
MCV: 97.6 fl (ref 78.0–100.0)
Monocytes Absolute: 0.5 10*3/uL (ref 0.1–1.0)
Monocytes Relative: 6.1 % (ref 3.0–12.0)
Neutro Abs: 6.6 10*3/uL (ref 1.4–7.7)
Neutrophils Relative %: 74.2 % (ref 43.0–77.0)
Platelets: 278 10*3/uL (ref 150.0–400.0)
RBC: 5.02 Mil/uL (ref 3.87–5.11)
RDW: 12.9 % (ref 11.5–15.5)
WBC: 8.8 10*3/uL (ref 4.0–10.5)

## 2019-01-18 LAB — LACTATE DEHYDROGENASE: LDH: 169 U/L (ref 120–250)

## 2019-01-18 LAB — COMPREHENSIVE METABOLIC PANEL
ALT: 14 U/L (ref 0–35)
AST: 19 U/L (ref 0–37)
Albumin: 4.7 g/dL (ref 3.5–5.2)
Alkaline Phosphatase: 96 U/L (ref 39–117)
BUN: 13 mg/dL (ref 6–23)
CO2: 27 mEq/L (ref 19–32)
Calcium: 10.1 mg/dL (ref 8.4–10.5)
Chloride: 101 mEq/L (ref 96–112)
Creatinine, Ser: 1.02 mg/dL (ref 0.40–1.20)
GFR: 56.12 mL/min — ABNORMAL LOW (ref >60.00)
Glucose, Bld: 86 mg/dL (ref 70–99)
Potassium: 4.5 mEq/L (ref 3.5–5.1)
Total Bilirubin: 0.8 mg/dL (ref 0.2–1.2)
Total Protein: 7.6 g/dL (ref 6.0–8.3)

## 2019-01-18 LAB — PROTIME-INR
INR: 1 ratio (ref 0.8–1.0)
Prothrombin Time: 11.7 s (ref 9.6–13.1)

## 2019-01-18 LAB — LIPASE: Lipase: 51 U/L (ref 11.0–59.0)

## 2019-01-19 ENCOUNTER — Ambulatory Visit
Admission: RE | Admit: 2019-01-19 | Discharge: 2019-01-19 | Disposition: A | Discharge status: Home / Self Care | Payer: BC Managed Care – PPO | Source: Ambulatory Visit | Attending: Family Medicine | Admitting: Family Medicine

## 2019-01-19 DIAGNOSIS — B89 Unspecified parasitic disease: Secondary | ICD-10-CM

## 2019-01-19 DIAGNOSIS — R109 Unspecified abdominal pain: Secondary | ICD-10-CM

## 2019-01-19 DIAGNOSIS — N281 Cyst of kidney, acquired: Secondary | ICD-10-CM | POA: Diagnosis not present

## 2019-01-19 LAB — URINALYSIS W MICROSCOPIC + REFLEX CULTURE
Bacteria, UA: NONE SEEN /HPF
Bilirubin Urine: NEGATIVE
Glucose, UA: NEGATIVE
Hgb urine dipstick: NEGATIVE
Hyaline Cast: NONE SEEN /LPF
Ketones, ur: NEGATIVE
Nitrites, Initial: NEGATIVE
Protein, ur: NEGATIVE
Specific Gravity, Urine: 1.013 (ref 1.001–1.03)
pH: 7 (ref 5.0–8.0)

## 2019-01-19 LAB — CULTURE INDICATED

## 2019-01-19 LAB — URINE CULTURE
MICRO NUMBER:: 813548
SPECIMEN QUALITY:: ADEQUATE

## 2019-01-20 LAB — FECAL FAT, QUALITATIVE

## 2019-01-22 LAB — OVA AND PARASITE EXAMINATION
CONCENTRATE RESULT:: NONE SEEN
MICRO NUMBER:: 814971
SPECIMEN QUALITY:: ADEQUATE
TRICHROME RESULT:: NONE SEEN

## 2019-01-22 LAB — STOOL CULTURE
MICRO NUMBER:: 814968
MICRO NUMBER:: 814969
MICRO NUMBER:: 814972
SHIGA RESULT:: NOT DETECTED
SPECIMEN QUALITY:: ADEQUATE
SPECIMEN QUALITY:: ADEQUATE
SPECIMEN QUALITY:: ADEQUATE

## 2019-01-22 LAB — FECAL LACTOFERRIN, QUANT
Fecal Lactoferrin: NEGATIVE
MICRO NUMBER:: 814970
SPECIMEN QUALITY:: ADEQUATE

## 2019-01-23 ENCOUNTER — Telehealth: Payer: Self-pay | Admitting: Family Medicine

## 2019-01-23 ENCOUNTER — Encounter: Payer: Self-pay | Admitting: Family Medicine

## 2019-01-23 NOTE — Telephone Encounter (Signed)
Faxed lab results

## 2019-01-23 NOTE — Telephone Encounter (Signed)
Please send all recent lab work and ultrasound to her infectious disease doctor.  Thanks

## 2019-01-23 NOTE — Telephone Encounter (Signed)
Called patient and went over labs. Scheduled patient for a lab appointment this Wednesday and a VV this Thursday to discuss those labs

## 2019-01-23 NOTE — Telephone Encounter (Addendum)
Please inform patient the following information: - her [place] of her kidneys was normal with the exception of a small cyst of her right kidney.  - her labs are also all normal (blood, stool and urine) with the exception of mildly lower than normal kidney function and elevated hemoglobin-both are which are new for her and started with her symptoms onset July 3.  - I would like to repeat the kidney function lab this week. I would like her to hydrate at least 80 ounces of water a day. Schedule her for lab appt only Wednesday please and provider f/u Thursday to discuss results and next steps (can be Video visit). If it has not returned to normal at that time- we will need to discuss refer to urology. I have sent the results to her ID doctor as well.

## 2019-01-23 NOTE — Telephone Encounter (Signed)
Pt was called and VM was full, unable to leave message.

## 2019-01-25 ENCOUNTER — Other Ambulatory Visit: Payer: Self-pay

## 2019-01-25 ENCOUNTER — Ambulatory Visit (INDEPENDENT_AMBULATORY_CARE_PROVIDER_SITE_OTHER): Payer: BC Managed Care – PPO

## 2019-01-25 DIAGNOSIS — R109 Unspecified abdominal pain: Secondary | ICD-10-CM | POA: Diagnosis not present

## 2019-01-25 DIAGNOSIS — B89 Unspecified parasitic disease: Secondary | ICD-10-CM

## 2019-01-25 LAB — BASIC METABOLIC PANEL
BUN: 14 mg/dL (ref 6–23)
CO2: 27 mEq/L (ref 19–32)
Calcium: 9.6 mg/dL (ref 8.4–10.5)
Chloride: 104 mEq/L (ref 96–112)
Creatinine, Ser: 1 mg/dL (ref 0.40–1.20)
GFR: 57.42 mL/min — ABNORMAL LOW (ref >60.00)
Glucose, Bld: 85 mg/dL (ref 70–99)
Potassium: 4.1 mEq/L (ref 3.5–5.1)
Sodium: 140 mEq/L (ref 135–145)

## 2019-01-26 ENCOUNTER — Encounter: Payer: Self-pay | Admitting: Family Medicine

## 2019-01-26 ENCOUNTER — Ambulatory Visit (INDEPENDENT_AMBULATORY_CARE_PROVIDER_SITE_OTHER): Payer: BC Managed Care – PPO | Admitting: Family Medicine

## 2019-01-26 VITALS — Ht 65.0 in

## 2019-01-26 DIAGNOSIS — B8781 Genitourinary myiasis: Secondary | ICD-10-CM

## 2019-01-26 DIAGNOSIS — D582 Other hemoglobinopathies: Secondary | ICD-10-CM | POA: Diagnosis not present

## 2019-01-26 DIAGNOSIS — R238 Other skin changes: Secondary | ICD-10-CM

## 2019-01-26 DIAGNOSIS — N183 Chronic kidney disease, stage 3 unspecified: Secondary | ICD-10-CM

## 2019-01-26 DIAGNOSIS — H04123 Dry eye syndrome of bilateral lacrimal glands: Secondary | ICD-10-CM

## 2019-01-26 DIAGNOSIS — R233 Spontaneous ecchymoses: Secondary | ICD-10-CM | POA: Insufficient documentation

## 2019-01-26 DIAGNOSIS — R0602 Shortness of breath: Secondary | ICD-10-CM

## 2019-01-26 DIAGNOSIS — R682 Dry mouth, unspecified: Secondary | ICD-10-CM | POA: Diagnosis not present

## 2019-01-26 DIAGNOSIS — K219 Gastro-esophageal reflux disease without esophagitis: Secondary | ICD-10-CM

## 2019-01-26 DIAGNOSIS — R194 Change in bowel habit: Secondary | ICD-10-CM

## 2019-01-26 HISTORY — DX: Chronic kidney disease, stage 3 unspecified: N18.30

## 2019-01-26 MED ORDER — AMLODIPINE BESYLATE 5 MG PO TABS: 5.0000 mg | ORAL_TABLET | Freq: Every day | ORAL | 0 refills | Status: DC

## 2019-01-26 NOTE — Progress Notes (Signed)
VIRTUAL VISIT VIA VIDEO  I connected with Rebecca Pollard on 01/26/19 at 11:30 AM EDT by a video enabled telemedicine application and verified that I am speaking with the correct person using two identifiers. Location patient: Home Location provider: [facility], Office Persons participating in the virtual visit: Patient, Dr. Raoul Pitch and R.Baker, LPN  I discussed the limitations of evaluation and management by telemedicine and the availability of in person appointments. The patient expressed understanding and agreed to proceed.  Interactive audio and video telecommunications were attempted between this provider and patient, however failed, due to patient having technical difficulties OR patient did not have access to video capability. We continued and completed visit with audio only.    Rebecca Pollard , 07-11-1960, 58 y.o., female MRN: [id number] Patient Care Team    Relationship Specialty Notifications Start End  Ma Hillock, DO PCP - General Family Medicine  05/31/15   Michel Bickers, MD Consulting Physician Infectious Diseases  01/23/19     Chief Complaint  Patient presents with   Follow-up    Review lab results.      Subjective:  Rebecca Pollard is a 58 y.o. female present today to discuss her lab and [place] results.  - her [place] of her kidneys was normal with the exception of a small cyst of her right kidney.  This has been chronic and stable.  - mildly lower than normal kidney function and elevated hemoglobin-both are which are new for her and started with her symptoms onset July 3-losartan was also started at that time and replaced lisinopril (which was stopped secondary to chronic cough). Patient has had multiple complaints and symptoms over the the last 2-3 months of easy bruising, shortness of breath which she states feels like she has COPD, right flank pain, burning chest and metallic taste, chronic cough, increased thirst, dry mouth and most recently GI complaints of  loose/tarry/oily stools.  She reports her flank pain actually feels "better with hydration.  "She states she has been pushing the water and is drinking greater than 80 ounces a day.  She reports no more issues as far as passing worms in her urine since the use of ivermectin and doxycycline.  Prior note:  Pt presents for an OV with complaints of right flank pain and bruising after completing an ivermectin course prescribed by infectious disease for possible genitourinary myiasis.  Patient has been seeing infectious disease since noting flies and warmth in her urine since November 25, 2018.  There has been some concern for genitourinary myiasis given her symptoms, however this has not been able to be proven diagnostically as of yet.  Her urine ova and parasite were negative.  There has been flies and worms visualized in her urine.  She was supplied with an ivermectin and doxycycline course in hopes to treat her  condition.  She states she did feel better and has not seen any evidence of parasites since the course of ivermectin/doxycycline.  However she started to experience right flank pain at the end of the course of ivermectin.  She also states she noted she is bruising more easily. In addition to the above she has noticed stool changes since June.  She states she had a oily appearance to her stools in June x1.  Which she attributed to what she had consumed that day was [employer].  Then approximately 2-3 weeks ago she had 2 episodes of "black oil "in her stool.  She has had no stool changes since.  She denies any fever, chills, nausea, vomit, dysuria or current stool changes.  She denies any bleeding from her gums.  She denies any rash.  Depression screen PHQ 2/9 09/17/2017  Decreased Interest 0  Down, Depressed, Hopeless 0  PHQ - 2 Score 0   No Known Allergies Social History   Social History Narrative   Married to [place]. 2 children, Alice and [place].    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     Drinks caffeine, herbal remedies, daily vitamin use.    Wears her seatbelt, smoke detector in the home, no firearms in the home.    Feels safe in her relationships.    Exercises 2x a week.    Past Medical History:  Diagnosis Date   Asthma    Chronic bronchitis (HCC)    Chronic fatigue    Fibromyalgia    GERD (gastroesophageal reflux disease)    Glaucoma    Hypertension 2019   IBS (irritable bowel syndrome)    Osteopenia 09/2016   T score -1.3 FRAX 5.3%/0.3%   Palpitations    echo and stress test normal 2015 per pt   Urinary incontinence    Past Surgical History:  Procedure Laterality Date   CHOLECYSTECTOMY     TRANSTHORACIC ECHOCARDIOGRAM  11/2013   EF 50-55%, normal LV function, normal wall motion, triavial MR and mild tricuspid regurg.   Family History  Problem Relation Age of Onset   Hearing loss Mother    Arthritis Father    Hearing loss Father    Stroke Father    Breast cancer Paternal Grandmother    Leukemia Maternal Uncle    Allergies as of 01/26/2019   No Known Allergies     Medication List       Accurate as of January 26, 2019 10:36 AM. If you have any questions, ask your nurse or doctor.        STOP taking these medications   levocetirizine 5 MG tablet Commonly known as: Xyzal Stopped by: Howard Pouch, DO     TAKE these medications   albuterol 108 (90 Base) MCG/ACT inhaler Commonly known as: VENTOLIN HFA Inhale 1-2 puffs into the lungs every 6 (six) hours as needed for wheezing or shortness of breath.   co-enzyme Q-10 30 MG capsule Take 30 mg by mouth 3 (three) times daily.   DHEA 10 MG Tabs Take by mouth.   estradiol 0.1 MG/GM vaginal cream Commonly known as: ESTRACE Place 1 Applicatorful vaginally at bedtime.   estradiol-norethindrone 0.05-0.14 MG/DAY Commonly known as: COMBIPATCH Place 1 patch onto the skin 2 (two) times a week. Pt cuts patch in half   fluticasone 50 MCG/ACT nasal spray Commonly known as:  FLONASE Place 2 sprays into both nostrils daily.   ipratropium 0.06 % nasal spray Commonly known as: Atrovent Place 2 sprays into both nostrils 4 (four) times daily.   losartan 50 MG tablet Commonly known as: COZAAR Take 1 tablet (50 mg total) by mouth daily.   multivitamin with minerals tablet Take 1 tablet by mouth daily.   predniSONE 50 MG tablet Commonly known as: DELTASONE Take 1 tablet (50 mg total) by mouth daily with breakfast.   vitamin B-12 100 MCG tablet Commonly known as: CYANOCOBALAMIN Take 100 mcg by mouth daily.   Vitamin C 500 MG Caps Take 2 tablets by mouth 2 (two) times daily.   Vitamin D3 2400 UNIT/ML Liqd 2,400 Units by Does not apply route daily.       All past medical history,  surgical history, allergies, family history, immunizations andmedications were updated in the EMR today and reviewed under the history and medication portions of their EMR.     ROS: Negative, with the exception of above mentioned in HPI   Objective:  Ht _0  (1.651 m)    LMP 04/16/2016 (Approximate)    BMI 30.02 kg/m  Body mass index is 30.02 kg/m. Gen: Afebrile. No acute distress.  HENT: AT. [place].  Eyes:Pupils Equal Round Reactive to light, Extraocular movements intact,  Conjunctiva without redness, discharge or icterus. CV: No edema Chest: No cough during exam, no shortness of breath during exam Skin: no rashes, purpura or petechiae.  Neuro: Normal gait. PERLA. EOMi. Alert. Oriented x3  Psych: Normal affect, dress and demeanor. Normal speech. Normal thought content and judgment.  No exam data present No results found. Results for orders placed or performed in visit on 01/25/19 (from the past 24 hour(s))  Basic Metabolic Panel (BMET)     Status: Abnormal   Collection Time: 01/25/19 10:50 AM  Result Value Ref Range   Sodium 140 135 - 145 mEq/L   Potassium 4.1 3.5 - 5.1 mEq/L   Chloride 104 96 - 112 mEq/L   CO2 27 19 - 32 mEq/L   Glucose, Bld 85 70 - 99 mg/dL    BUN 14 6 - 23 mg/dL   Creatinine, Ser 1.00 0.40 - 1.20 mg/dL   Calcium 9.6 8.4 - 10.5 mg/dL   GFR 57.42 (L) >60.00 mL/min    Assessment/Plan: Rebecca Pollard is a 58 y.o. female present for OV for  Parasitic infection/flank pain/Genitourinary myiasis Fortunately she seems to have improved since the ivermectin/doxycycline as far as her parasitic infection symptoms go.   - Comp Met (CMET)>>CKD3 (GFR 57) new and confirmed  since last lab collected 06/17/2018>> trial off losartan (started July) had been on lisinopril for years without renal changes. Start/prescribed amlodipine 5 mg in its place and repeat bmp in 4 weeks.  - CBC w/Diff>> Hgb 16.2/HCT 49 >> continues to rise - US Renal>>normal with renal cyst- chronic stable - Urinalysis w microscopic + reflex culture>>normal   Bowel habit changes/fatty stools -Work-up surrounding her bowel habit changes are negative.  She is having regular stools currently.  If symptoms present again would advise her to follow with her gastroenterologist. -Use of probiotic encouraged. - Lipase>>normal - Ova and parasite examination>>normal - Fecal fat, qualitative>>normal - Stool, WBC/Lactoferrin>>normal - Stool Culture>>normal - Lactate Dehydrogenase (LDH)>>normal - Stool Culture>>Normal - Stool, WBC/Lactoferrin>>normal - Ova and parasite examination>>normal - Fecal fat, qualitative>>normal  Easy bruising/shortness of breath/increased thirst/dry mouth/elevated hemoglobin -Uncertain etiology.  Avoid all NSAIDs. - Protime-INR ( SOLSTAS ONLY)>>normal - Lactate Dehydrogenase (LDH)>> normal - Chest x-ray 11/11/2018 normal>> if shortness of breath complaints continue consider pulmonology referral versus CT chest.  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 - ACE level, ANA, alpha antitrypsin, erythropoietin, carboxyhemoglobin and iron panel collected today.  Depending on results will consider referral to pulmonology versus  hematology.  CKD 3: -Trial off losartan first.  Retest BMP in 4 weeks after start of amlodipine.  If still decreasing will need to consider other causes  LPR: I believe some of her metallic taste and burning sensation in her chest is possibly reflux related.  She has had similar symptoms in the past and the tickle cough>>> advise her to start over-the-counter Prilosec at least 2 weeks for trial.    Reviewed expectations re: course of current medical  issues.  Discussed self-management of symptoms.  Outlined signs and symptoms indicating need for more acute intervention.  Patient verbalized understanding and all questions were answered.  Patient received an After-Visit Summary.    No orders of the defined types were placed in this encounter.  > 25 minutes spent with patient, >50% of time spent face to face     Note is dictated utilizing voice recognition software. Although note has been proof read prior to signing, occasional typographical errors still can be missed. If any questions arise, please do not hesitate to call for verification.   electronically signed by:  Howard Pouch, DO  [place]

## 2019-01-31 ENCOUNTER — Other Ambulatory Visit: Payer: Self-pay

## 2019-01-31 ENCOUNTER — Ambulatory Visit (INDEPENDENT_AMBULATORY_CARE_PROVIDER_SITE_OTHER): Payer: BC Managed Care – PPO | Admitting: Family Medicine

## 2019-01-31 DIAGNOSIS — R0602 Shortness of breath: Secondary | ICD-10-CM

## 2019-01-31 DIAGNOSIS — H04123 Dry eye syndrome of bilateral lacrimal glands: Secondary | ICD-10-CM

## 2019-01-31 DIAGNOSIS — D582 Other hemoglobinopathies: Secondary | ICD-10-CM

## 2019-01-31 DIAGNOSIS — R238 Other skin changes: Secondary | ICD-10-CM | POA: Diagnosis not present

## 2019-01-31 DIAGNOSIS — R233 Spontaneous ecchymoses: Secondary | ICD-10-CM

## 2019-01-31 DIAGNOSIS — N183 Chronic kidney disease, stage 3 unspecified: Secondary | ICD-10-CM

## 2019-01-31 DIAGNOSIS — R682 Dry mouth, unspecified: Secondary | ICD-10-CM

## 2019-02-02 ENCOUNTER — Encounter: Payer: Self-pay | Admitting: Family Medicine

## 2019-02-02 DIAGNOSIS — K219 Gastro-esophageal reflux disease without esophagitis: Secondary | ICD-10-CM | POA: Insufficient documentation

## 2019-02-03 LAB — ALPHA-1-ANTITRYPSIN: A-1 Antitrypsin, Ser: 141 mg/dL (ref 83–199)

## 2019-02-03 LAB — ANA, IFA COMPREHENSIVE PANEL
Anti Nuclear Antibody (ANA): POSITIVE — AB
ENA SM Ab Ser-aCnc: <1 AI
SM/RNP: <1 AI
SSA (Ro) (ENA) Antibody, IgG: <1 AI
SSB (La) (ENA) Antibody, IgG: <1 AI
Scleroderma (Scl-70) (ENA) Antibody, IgG: <1 AI
ds DNA Ab: <1 IU/mL

## 2019-02-03 LAB — ANGIOTENSIN CONVERTING ENZYME: Angiotensin-Converting Enzyme: 32 U/L (ref 9–67)

## 2019-02-03 LAB — CARBOXYHEMOGLOBIN: Carboxyhemoglobin: NOT DETECTED %TOTAL HGB

## 2019-02-03 LAB — IRON,TIBC AND FERRITIN PANEL
%SAT: 27 % (calc) (ref 16–45)
Ferritin: 76 ng/mL (ref 16–232)
Iron: 90 ug/dL (ref 45–160)
TIBC: 331 mcg/dL (calc) (ref 250–450)

## 2019-02-03 LAB — ERYTHROPOIETIN: Erythropoietin: 12.1 m[IU]/mL (ref 2.6–18.5)

## 2019-02-03 LAB — ANTI-NUCLEAR AB-TITER (ANA TITER): ANA Titer 1: 1:80 {titer} — ABNORMAL HIGH

## 2019-02-06 ENCOUNTER — Telehealth: Payer: Self-pay | Admitting: Family Medicine

## 2019-02-06 DIAGNOSIS — D582 Other hemoglobinopathies: Secondary | ICD-10-CM

## 2019-02-06 DIAGNOSIS — R0602 Shortness of breath: Secondary | ICD-10-CM

## 2019-02-06 DIAGNOSIS — R05 Cough: Secondary | ICD-10-CM

## 2019-02-06 DIAGNOSIS — R053 Chronic cough: Secondary | ICD-10-CM

## 2019-02-06 DIAGNOSIS — N183 Chronic kidney disease, stage 3 unspecified: Secondary | ICD-10-CM

## 2019-02-06 DIAGNOSIS — R238 Other skin changes: Secondary | ICD-10-CM

## 2019-02-06 DIAGNOSIS — R233 Spontaneous ecchymoses: Secondary | ICD-10-CM

## 2019-02-06 NOTE — Addendum Note (Signed)
Addended by: Howard Pouch A on: 02/06/2019 05:56 PM   Modules accepted: Orders

## 2019-02-06 NOTE — Telephone Encounter (Addendum)
Please inform patient the following information: Her labs are all normal with the exception of a mildly positive ANA with normal reflex- which is essentially negative result when titer is so low.  I have referred her to hematology to further evaluate the cause of her elevating hemoglobin. I have also referred her to pulmonology for further work up on her shortness of breath (there is chance they are connected).   Please make sure she schedules her BMP- lab repeat, which is supposed to be collected 4 weeks after she switches to the amlodipine with nurse BP check. Thanks

## 2019-02-07 NOTE — Telephone Encounter (Signed)
Pt was called and given results. She was scheduled for a repeat lab and BP check

## 2019-02-10 ENCOUNTER — Telehealth: Payer: Self-pay | Admitting: Adult Health

## 2019-02-10 NOTE — Telephone Encounter (Signed)
A new hem appt has been scheduled for Rebecca Pollard to see Mendel Ryder on 10/6 at [place] been made aware to arrive 15 minutes early.

## 2019-02-15 ENCOUNTER — Encounter: Payer: Self-pay | Admitting: Gynecology

## 2019-02-22 ENCOUNTER — Other Ambulatory Visit: Payer: Self-pay | Admitting: Obstetrics & Gynecology

## 2019-02-22 DIAGNOSIS — Z1231 Encounter for screening mammogram for malignant neoplasm of breast: Secondary | ICD-10-CM

## 2019-02-24 ENCOUNTER — Encounter: Payer: Self-pay | Admitting: Family Medicine

## 2019-02-24 ENCOUNTER — Ambulatory Visit (INDEPENDENT_AMBULATORY_CARE_PROVIDER_SITE_OTHER): Payer: BC Managed Care – PPO | Admitting: Family Medicine

## 2019-02-24 ENCOUNTER — Other Ambulatory Visit: Payer: Self-pay

## 2019-02-24 VITALS — BP 122/88 | HR 63 | Temp 98.1°F | Resp 16 | Ht 65.0 in | Wt 158.2 lb

## 2019-02-24 DIAGNOSIS — R197 Diarrhea, unspecified: Secondary | ICD-10-CM | POA: Diagnosis not present

## 2019-02-24 DIAGNOSIS — R634 Abnormal weight loss: Secondary | ICD-10-CM

## 2019-02-24 DIAGNOSIS — R233 Spontaneous ecchymoses: Secondary | ICD-10-CM

## 2019-02-24 DIAGNOSIS — K909 Intestinal malabsorption, unspecified: Secondary | ICD-10-CM | POA: Insufficient documentation

## 2019-02-24 DIAGNOSIS — R194 Change in bowel habit: Secondary | ICD-10-CM | POA: Diagnosis not present

## 2019-02-24 DIAGNOSIS — R109 Unspecified abdominal pain: Secondary | ICD-10-CM

## 2019-02-24 DIAGNOSIS — R195 Other fecal abnormalities: Secondary | ICD-10-CM

## 2019-02-24 DIAGNOSIS — R232 Flushing: Secondary | ICD-10-CM

## 2019-02-24 DIAGNOSIS — R238 Other skin changes: Secondary | ICD-10-CM

## 2019-02-24 DIAGNOSIS — D582 Other hemoglobinopathies: Secondary | ICD-10-CM

## 2019-02-24 LAB — COMPREHENSIVE METABOLIC PANEL
ALT: 16 U/L (ref 0–35)
AST: 20 U/L (ref 0–37)
Albumin: 4.5 g/dL (ref 3.5–5.2)
Alkaline Phosphatase: 109 U/L (ref 39–117)
BUN: 10 mg/dL (ref 6–23)
CO2: 30 mEq/L (ref 19–32)
Calcium: 10.2 mg/dL (ref 8.4–10.5)
Chloride: 104 mEq/L (ref 96–112)
Creatinine, Ser: 1 mg/dL (ref 0.40–1.20)
GFR: 57.4 mL/min — ABNORMAL LOW (ref >60.00)
Glucose, Bld: 92 mg/dL (ref 70–99)
Potassium: 4.2 mEq/L (ref 3.5–5.1)
Sodium: 144 mEq/L (ref 135–145)
Total Bilirubin: 0.7 mg/dL (ref 0.2–1.2)
Total Protein: 7.5 g/dL (ref 6.0–8.3)

## 2019-02-24 LAB — LIPASE: Lipase: 48 U/L (ref 11.0–59.0)

## 2019-02-24 MED ORDER — AMLODIPINE BESYLATE 5 MG PO TABS: 5.0000 mg | ORAL_TABLET | Freq: Every day | ORAL | 1 refills | Status: DC

## 2019-02-24 NOTE — Patient Instructions (Addendum)
Stop any supplement that is in a capsule and may be oily.  I will order a CT of the abd- it will take until next week to get this set up, if worsening pain over the weekend than please go to ED.  Please complete stool studies this weekend.

## 2019-02-24 NOTE — Progress Notes (Signed)
Rebecca Pollard , 1960/12/10, 58 y.o., female MRN: [id number] Patient Care Team    Relationship Specialty Notifications Start End  Ma Hillock, DO PCP - General Family Medicine  05/31/15   Michel Bickers, MD Consulting Physician Infectious Diseases  01/23/19     Chief Complaint  Patient presents with  . Stool Color Change    black stool. 1 day. having hot flashes and sweating. abdominal pain on the right side.      Subjective:  Rebecca Pollard is a 58 y.o. female present today to discuss recurrence of black oily stool.  Patient reports she had a recurrence of the oily black substance in her stool.  She has ongoing diffuse abdominal discomfort now greater than 10 weeks, with increased right abdomen/flank discomfort that she states now is also radiating around her back and having radiation to her right shoulder.  She has a history of cholecystectomy.  She also endorses pale-colored stools that are soft but formed.  She reports after eating homemade chicken noodle soup she again had a recurrence of the black oily substance in her stool.  She brings a picture with her today-with normal consistency, although mildly pale stools with multiple size drops, largest about the size of a quarter of floating, clinging to the sides of the bowl black oily drops.  She is still endorsing the hot flashes, sweating and flushing.  She has appointment with hematology next week.  She has had unintentional weight loss over the last month. Prior note: - her [place] of her kidneys was normal with the exception of a small cyst of her right kidney.  This has been chronic and stable.  - mildly lower than normal kidney function and elevated hemoglobin-both are which are new for her and started with her symptoms onset July 3-losartan was also started at that time and replaced lisinopril (which was stopped secondary to chronic cough). Patient has had multiple complaints and symptoms over the the last 2-3 months of easy bruising,  shortness of breath which she states feels like she has COPD, right flank pain, burning chest and metallic taste, chronic cough, increased thirst, dry mouth and most recently GI complaints of loose/tarry/oily stools.  She reports her flank pain actually feels "better with hydration.  "She states she has been pushing the water and is drinking greater than 80 ounces a day.  She reports no more issues as far as passing worms in her urine since the use of ivermectin and doxycycline.  Prior note:  Pt presents for an OV with complaints of right flank pain and bruising after completing an ivermectin course prescribed by infectious disease for possible genitourinary myiasis.  Patient has been seeing infectious disease since noting flies and warmth in her urine since November 25, 2018.  There has been some concern for genitourinary myiasis given her symptoms, however this has not been able to be proven diagnostically as of yet.  Her urine ova and parasite were negative.  There has been flies and worms visualized in her urine.  She was supplied with an ivermectin and doxycycline course in hopes to treat her  condition.  She states she did feel better and has not seen any evidence of parasites since the course of ivermectin/doxycycline.  However she started to experience right flank pain at the end of the course of ivermectin.  She also states she noted she is bruising more easily. In addition to the above she has noticed stool changes since June.  She states she  had a oily appearance to her stools in June x1.  Which she attributed to what she had consumed that day was [employer].  Then approximately 2-3 weeks ago she had 2 episodes of "black oil "in her stool.  She has had no stool changes since.  She denies any fever, chills, nausea, vomit, dysuria or current stool changes.  She denies any bleeding from her gums.  She denies any rash.  Depression screen PHQ 2/9 09/17/2017  Decreased Interest 0  Down, Depressed, Hopeless 0   PHQ - 2 Score 0   No Known Allergies Social History   Social History Narrative   Married to Rebecca Pollard. 2 children, Rebecca Pollard and Rebecca Pollard.    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    Drinks caffeine, herbal remedies, daily vitamin use.    Wears her seatbelt, smoke detector in the home, no firearms in the home.    Feels safe in her relationships.    Exercises 2x a week.    Past Medical History:  Diagnosis Date  . Asthma   . Chronic bronchitis ([place])   . Chronic fatigue   . Fibromyalgia   . GERD (gastroesophageal reflux disease)   . Glaucoma   . Hypertension 2019  . IBS (irritable bowel syndrome)   . Osteopenia 09/2016   T score -1.3 FRAX 5.3%/0.3%  . Palpitations    echo and stress test normal 2015 per pt  . Urinary incontinence    Past Surgical History:  Procedure Laterality Date  . CHOLECYSTECTOMY    . TRANSTHORACIC ECHOCARDIOGRAM  11/2013   EF 50-55%, normal LV function, normal wall motion, triavial MR and mild tricuspid regurg.   Family History  Problem Relation Age of Onset  . Hearing loss Mother   . Arthritis Father   . Hearing loss Father   . Stroke Father   . Breast cancer Paternal Grandmother   . Leukemia Maternal Uncle    Allergies as of 02/24/2019   No Known Allergies     Medication List       Accurate as of February 24, 2019 11:16 AM. If you have any questions, ask your nurse or doctor.        albuterol 108 (90 Base) MCG/ACT inhaler Commonly known as: VENTOLIN HFA Inhale 1-2 puffs into the lungs every 6 (six) hours as needed for wheezing or shortness of breath.   amLODipine 5 MG tablet Commonly known as: NORVASC Take 1 tablet (5 mg total) by mouth daily.   aspirin EC 81 MG tablet Take 81 mg by mouth 2 (two) times daily.   co-enzyme Q-10 30 MG capsule Take 30 mg by mouth 3 (three) times daily.   DHEA 10 MG Tabs Take by mouth.   estradiol 0.1 MG/GM vaginal cream Commonly known as: ESTRACE Place 1 Applicatorful vaginally at bedtime.    estradiol-norethindrone 0.05-0.14 MG/DAY Commonly known as: COMBIPATCH Place 1 patch onto the skin 2 (two) times a week. Pt cuts patch in half   fluticasone 50 MCG/ACT nasal spray Commonly known as: FLONASE Place 2 sprays into both nostrils daily.   multivitamin with minerals tablet Take 1 tablet by mouth daily.   vitamin B-12 100 MCG tablet Commonly known as: CYANOCOBALAMIN Take 100 mcg by mouth daily.   Vitamin C 500 MG Caps Take 2 tablets by mouth 2 (two) times daily.       All past medical history, surgical history, allergies, family history, immunizations andmedications were updated in the EMR today and reviewed under the history and medication portions  of their EMR.     ROS: Negative, with the exception of above mentioned in HPI   Objective:  BP 122/88 (BP Location: Left Arm, Patient Position: Sitting, Cuff Size: Normal)   Pulse 63   Temp 98.1 F (36.7 C) (Temporal)   Resp 16   Ht 5' 5"  (1.651 m)   Wt 158 lb 4 oz (71.8 kg)   LMP 04/16/2016 (Approximate)   SpO2 100%   BMI 26.33 kg/m  Body mass index is 26.33 kg/m. Gen: Afebrile. No acute distress.  HENT: AT. [place].  MMM.  Eyes:Pupils Equal Round Reactive to light, Extraocular movements intact,  Conjunctiva without redness, discharge or icterus. Neck/lymp/endocrine: Supple, no lymphadenopathy, no thyromegaly CV: RRR no murmur, no edema Chest: CTAB, no wheeze or crackles Abd: Soft.  Flat.ND.  Mild-moderate diffuse tenderness, worse over right lateral abdomen/flank BS present.  No masses palpated.  Skin: No rashes, purpura or petechiae.  Neuro:  Normal gait. PERLA. EOMi. Alert. Oriented x3 Psych: Normal affect, dress and demeanor. Normal speech. Normal thought content and judgment.   No exam data present No results found. No results found for this or any previous visit (from the past 24 hour(s)).  Assessment/Plan: Rebecca Pollard is a 58 y.o. female present for OV for  Parasitic infection/Genitourinary myiasis  Fortunately urine changes/flies seem to have resolved with ivermectin/doxy - Comp Met (CMET)>>CKD3 (GFR 57) new and confirmed  since last lab collected 06/17/2018>> trial off losartan (started July) had been on lisinopril for years without renal changes. Start/prescribed amlodipine 5 mg in its place and repeat bmp today (unchanged). Bp controlled.  - CBC w/Diff>> Hgb 16.2/HCT 49 >> continues to rise>>referred to heme/onc - US Renal>>normal with renal cyst- chronic stable - Urinalysis w microscopic + reflex culture>>normal   Bowel habit changes/fatty stools/unintentional weight loss/pale stools -Work-up surrounding her bowel habit changes have been  negative.  She continues to have pain > 10 weeks with pales colored stool with black oily substance within stool. Concern for pancreatic, hepatic, or colon   malignancy or pathology.  - asked her to attempt to produce the same scenario, by eating the same foods and then collect stool studies this weekend. She has not had a BM since yesterday, so hopefully will be able capture a sample with oily substance within.  - Lipase>>normal 12/2018>>repeat today - Ova and parasite examination>>normal 12/2018 - Fecal fat, qualitative>>normal 12/2018> rpt this weekend.  - Stool, WBC/Lactoferrin>>normal 12/2018>>rpt this weekend - Stool Culture>>normal 12/2018>>rpt this weekend - Lactate Dehydrogenase (LDH)>>normal 12/2018 - referral to gastroenterology placed.  - CT ABD ordered for further evaluation of abd pain/unintentiional weight loss/bowel changes   Easy bruising/shortness of breath/increased thirst/dry mouth/elevated hemoglobin -Uncertain etiology. Continue to Avoid all NSAIDs. - Protime-INR ( [facility] ONLY)>>normal - Lactate Dehydrogenase (LDH)>> normal - Chest x-ray 11/11/2018 normal>> if shortness of breath complaints continue consider pulmonology referral versus CT chest.  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 -  ACE level, ANA, alpha antitrypsin, erythropoietin, carboxyhemoglobin and iron panel >>normal.    Reviewed expectations re: course of current medical issues.  Discussed self-management of symptoms.  Outlined signs and symptoms indicating need for more acute intervention.  Patient verbalized understanding and all questions were answered.  Patient received an After-Visit Summary.    No orders of the defined types were placed in this encounter.  > 25 minutes spent with patient, >50% of time spent face to face   Orders Placed This Encounter  Procedures  . Fecal Fat, Qualitative  . Ova and parasite examination  . Stool Culture  . CT Abdomen Pelvis W Contrast  . Comp Met (CMET)  . Lipase  . Stool, WBC/Lactoferrin  . Ambulatory referral to Gastroenterology     Note is dictated utilizing voice recognition software. Although note has been proof read prior to signing, occasional typographical errors still can be missed. If any questions arise, please do not hesitate to call for verification.   electronically signed by:  Howard Pouch, DO  [place]

## 2019-02-27 ENCOUNTER — Other Ambulatory Visit: Payer: Self-pay | Admitting: Adult Health

## 2019-02-27 ENCOUNTER — Telehealth: Payer: Self-pay | Admitting: Family Medicine

## 2019-02-27 DIAGNOSIS — D582 Other hemoglobinopathies: Secondary | ICD-10-CM

## 2019-02-27 NOTE — Telephone Encounter (Signed)
Regarding her weight, kidneys and stomach issues. Does not why she is coming in this week.  Set up for CT, should she do contrast? Would that be bad for her kidneys?  Please call [phone]

## 2019-02-27 NOTE — Progress Notes (Addendum)
[facility]  [phone] [phone]     ID: Rebecca Pollard DOB: August 07, 1960  MR#: [id number]  BZJ#:[id number]  Patient Care Team: Ma Hillock, DO as PCP - General (Family Medicine) Michel Bickers, MD as Consulting Physician (Infectious Diseases) Scot Dock, NP OTHER MD:  CHIEF COMPLAINT: erythrocytosis   CURRENT TREATMENT: observation   HISTORY OF CURRENT ILLNESS:  Patient was evaluated by PCP for increased bruising in her upper legs, and her hemoglobin was 16.2.  She was referred here for evaluation.  She notes that prior to having increase in hemoglobin or her CKD diagnosis that she was treated for parasites.  Since then her labs were abnormal.    The patient's subsequent history is as detailed below.  INTERVAL HISTORY: [name] is doing well today.  She undersatnds that she is here for evaluation of her increased hemoglobin.  She notes that she had two episodes over the past 3 months where her hemoglobin was increased.  She has been fatigued since she was 37.  She has not noted any night sweats, lymphadenopathy, but does note she doesn't feel her breathing is 100% what it should be.  She has no sleep apnea, snoring, or tobacco use.    REVIEW OF SYSTEMS:  [name] is doing moderately well today.  She denies any fever or chills.  She is without nausea, vomiting, bowel/bladder changes, cough, shortness of breath, chest pain, palpitations.  She has no headaches or vision issues.  She is a never smoker, and is married with three children who are now living outside the home.  She does not drink ETOH, and she exercises occasionally with walking.  PAST MEDICAL HISTORY: Past Medical History:  Diagnosis Date  . Asthma   . Chronic bronchitis ([place])   . Chronic fatigue   . Fibromyalgia   . GERD (gastroesophageal reflux disease)   . Glaucoma   . Hypertension 2019  . IBS (irritable bowel syndrome)   . Osteopenia 09/2016   T score -1.3 FRAX  5.3%/0.3%  . Palpitations    echo and stress test normal 2015 per pt  . Urinary incontinence     PAST SURGICAL HISTORY: Past Surgical History:  Procedure Laterality Date  . CHOLECYSTECTOMY    . TRANSTHORACIC ECHOCARDIOGRAM  11/2013   EF 50-55%, normal LV function, normal wall motion, triavial MR and mild tricuspid regurg.    FAMILY HISTORY Family History  Problem Relation Age of Onset  . Hearing loss Mother   . Arthritis Father   . Hearing loss Father   . Stroke Father   . Breast cancer Paternal Grandmother   . Leukemia Maternal Uncle     GYNECOLOGIC HISTORY:  Patient's last menstrual period was 04/16/2016 (approximate).    SOCIAL HISTORY:  Married, lives with her husband in [place], [place], Never smoker.  Unemployed, enjoys reading, exercises with walking, no ETOH, no drugs.    ADVANCED DIRECTIVES:    HEALTH MAINTENANCE: Social History   Tobacco Use  . Smoking status: Never Smoker  . Smokeless tobacco: Never Used  Substance Use Topics  . Alcohol use: Not Currently    Comment: rare  . Drug use: No     Colonoscopy:up to date  PAP: up to date  Bone density:   No Known Allergies  Current Outpatient Medications  Medication Sig Dispense Refill  . albuterol (PROVENTIL HFA;VENTOLIN HFA) 108 (90 Base) MCG/ACT inhaler Inhale 1-2 puffs into the lungs every 6 (six) hours as needed for wheezing or shortness of breath.  1 Inhaler 0  . amLODipine (NORVASC) 5 MG tablet Take 1 tablet (5 mg total) by mouth daily. 90 tablet 1  . Ascorbic Acid (VITAMIN C) 500 MG CAPS Take 2 tablets by mouth 2 (two) times daily.    Marland Kitchen aspirin EC 81 MG tablet Take 81 mg by mouth 2 (two) times daily.    Marland Kitchen co-enzyme Q-10 30 MG capsule Take 30 mg by mouth 3 (three) times daily.    Marland Kitchen estradiol (ESTRACE) 0.1 MG/GM vaginal cream Place 1 Applicatorful vaginally at bedtime.    Marland Kitchen estradiol-norethindrone (COMBIPATCH) 0.05-0.14 MG/DAY Place 1 patch onto the skin 2 (two) times a week. Pt cuts patch in half     . Multiple Vitamins-Minerals (MULTIVITAMIN WITH MINERALS) tablet Take 1 tablet by mouth daily.    . vitamin B-12 (CYANOCOBALAMIN) 100 MCG tablet Take 100 mcg by mouth daily.     No current facility-administered medications for this visit.     OBJECTIVE:  Vitals:   02/28/19 0915  BP: 134/81  Pulse: 67  Resp: 18  Temp: 98 F (36.7 C)  SpO2: 100%     Body mass index is 30 kg/m.   Wt Readings from Last 3 Encounters:  02/28/19 180 lb 4.8 oz (81.8 kg)  02/24/19 158 lb 4 oz (71.8 kg)  01/17/19 180 lb 6 oz (81.8 kg)      ECOG FS:0 - Asymptomatic GENERAL: Patient is a well appearing female in no acute distress HEENT:  Sclerae anicteric.  Oropharynx clear and moist. No ulcerations or evidence of oropharyngeal candidiasis. Neck is supple.  NODES:  No cervical, supraclavicular, or axillary lymphadenopathy palpated.  LUNGS:  Clear to auscultation bilaterally.  No wheezes or rhonchi. HEART:  Regular rate and rhythm. No murmur appreciated. ABDOMEN:  Soft, nontender.  Positive, normoactive bowel sounds. No organomegaly palpated. MSK:  No focal spinal tenderness to palpation.  EXTREMITIES:  No peripheral edema.   SKIN:  Clear with no obvious rashes or skin changes. No nail dyscrasia. NEURO:  Nonfocal. Well oriented.  Appropriate affect.     LAB RESULTS:  CMP     Component Value Date/Time   NA 141 02/28/2019 1010   K 4.3 02/28/2019 1010   CL 106 02/28/2019 1010   CO2 25 02/28/2019 1010   GLUCOSE 92 02/28/2019 1010   BUN 11 02/28/2019 1010   CREATININE 0.95 02/28/2019 1010   CREATININE 0.92 07/27/2016 1556   CALCIUM 9.4 02/28/2019 1010   PROT 7.4 02/28/2019 1010   ALBUMIN 4.0 02/28/2019 1010   AST 21 02/28/2019 1010   ALT 15 02/28/2019 1010   ALKPHOS 97 02/28/2019 1010   BILITOT 0.3 02/28/2019 1010   GFRNONAA >60 02/28/2019 1010   GFRNONAA 71 07/27/2016 1556   GFRAA >60 02/28/2019 1010   GFRAA 82 07/27/2016 1556    No results found for: TOTALPROTELP, ALBUMINELP,  A1GS, A2GS, BETS, BETA2SER, GAMS, MSPIKE, SPEI  No results found for: Nils Pyle, [facility]  Lab Results  Component Value Date   WBC 6.7 02/28/2019   NEUTROABS 4.5 02/28/2019   HGB 15.6 (H) 02/28/2019   HCT 46.8 (H) 02/28/2019   MCV 96.9 02/28/2019   PLT 278 02/28/2019      Chemistry      Component Value Date/Time   NA 141 02/28/2019 1010   K 4.3 02/28/2019 1010   CL 106 02/28/2019 1010   CO2 25 02/28/2019 1010   BUN 11 02/28/2019 1010   CREATININE 0.95 02/28/2019 1010   CREATININE 0.92 07/27/2016 1556  Component Value Date/Time   CALCIUM 9.4 02/28/2019 1010   ALKPHOS 97 02/28/2019 1010   AST 21 02/28/2019 1010   ALT 15 02/28/2019 1010   BILITOT 0.3 02/28/2019 1010       No results found for: LABCA2  No components found for: [id number]  No results for input(s): INR in the last 168 hours.  No results found for: LABCA2  No results found for: AVW979  No results found for: YIA165  No results found for: VVZ482  No results found for: CA2729  No components found for: HGQUANT  No results found for: CEA1 / No results found for: CEA1   No results found for: AFPTUMOR  No results found for: CHROMOGRNA  No results found for: PSA1  Appointment on 02/28/2019  Component Date Value Ref Range Status  . Smear Review 02/28/2019 SMEAR STAINED AND AVAILABLE FOR REVIEW   Final   Performed at [facility], [address] [place], [place] [address]  . Retic Ct Pct 02/28/2019 1.0  0.4 - 3.1 % Final  . RBC. 02/28/2019 4.83  3.87 - 5.11 MIL/uL Final  . Retic Count, Absolute 02/28/2019 50.2  19.0 - 186.0 K/uL Final  . Immature Retic Fract 02/28/2019 9.0  2.3 - 15.9 % Final  . Reticulocyte Hemoglobin 02/28/2019 36.9  >27.9 pg Final   Comment:        Given the high negative predictive value of a RET-He result > 32 pg iron deficiency is essentially excluded. If this patient is anemic other etiologies should be considered.  Performed at [facility], [place] [address] [place], [place] [address]   . Sodium 02/28/2019 141  135 - 145 mmol/L Final  . Potassium 02/28/2019 4.3  3.5 - 5.1 mmol/L Final  . Chloride 02/28/2019 106  98 - 111 mmol/L Final  . CO2 02/28/2019 25  22 - 32 mmol/L Final  . Glucose, Bld 02/28/2019 92  70 - 99 mg/dL Final  . BUN 02/28/2019 11  6 - 20 mg/dL Final  . Creatinine 02/28/2019 0.95  0.44 - 1.00 mg/dL Final  . Calcium 02/28/2019 9.4  8.9 - 10.3 mg/dL Final  . Total Protein 02/28/2019 7.4  6.5 - 8.1 g/dL Final  . Albumin 02/28/2019 4.0  3.5 - 5.0 g/dL Final  . AST 02/28/2019 21  15 - 41 U/L Final  . ALT 02/28/2019 15  0 - 44 U/L Final  . Alkaline Phosphatase 02/28/2019 97  38 - 126 U/L Final  . Total Bilirubin 02/28/2019 0.3  0.3 - 1.2 mg/dL Final  . GFR, Est Non Af Am 02/28/2019 >60  >60 mL/min Final  . GFR, Est AFR Am 02/28/2019 >60  >60 mL/min Final  . Anion gap 02/28/2019 10  5 - 15 Final   Performed at [facility], [place] [address], [place], [place] [address]  . WBC Count 02/28/2019 6.7  4.0 - 10.5 K/uL Final  . RBC 02/28/2019 4.83  3.87 - 5.11 MIL/uL Final  . Hemoglobin 02/28/2019 15.6* 12.0 - 15.0 g/dL Final  . HCT 02/28/2019 46.8* 36.0 - 46.0 % Final  . MCV 02/28/2019 96.9  80.0 - 100.0 fL Final  . MCH 02/28/2019 32.3  26.0 - 34.0 pg Final  . MCHC 02/28/2019 33.3  30.0 - 36.0 g/dL Final  . RDW 02/28/2019 12.0  11.5 - 15.5 % Final  . Platelet Count 02/28/2019 278  150 - 400 K/uL Final  . nRBC 02/28/2019 0.0  0.0 -  0.2 % Final  . Neutrophils Relative % 02/28/2019 68  % Final  . Neutro Abs 02/28/2019 4.5  1.7 - 7.7 K/uL Final  . Lymphocytes Relative 02/28/2019 22  % Final  . Lymphs Abs 02/28/2019 1.5  0.7 - 4.0 K/uL Final  . Monocytes Relative 02/28/2019 8  % Final  . Monocytes Absolute 02/28/2019 0.5  0.1 - 1.0 K/uL Final  . Eosinophils Relative 02/28/2019 1  % Final  . Eosinophils Absolute 02/28/2019 0.1  0.0 - 0.5 K/uL  Final  . Basophils Relative 02/28/2019 1  % Final  . Basophils Absolute 02/28/2019 0.0  0.0 - 0.1 K/uL Final  . Immature Granulocytes 02/28/2019 0  % Final  . Abs Immature Granulocytes 02/28/2019 0.02  0.00 - 0.07 K/uL Final   Performed at [facility], [place] [address], [place], [place] [address]    (this displays the last labs from the last 3 days)  No results found for: TOTALPROTELP, ALBUMINELP, A1GS, A2GS, BETS, BETA2SER, GAMS, MSPIKE, SPEI (this displays SPEP labs)  No results found for: KPAFRELGTCHN, LAMBDASER, KAPLAMBRATIO (kappa/lambda light chains)  No results found for: HGBA, HGBA2QUANT, HGBFQUANT, HGBSQUAN (Hemoglobinopathy evaluation)   Lab Results  Component Value Date   LDH 169 01/17/2019    Lab Results  Component Value Date   IRON 90 01/31/2019   TIBC 331 01/31/2019   IRONPCTSAT 27 01/31/2019   (Iron and TIBC)  Lab Results  Component Value Date   FERRITIN 76 01/31/2019    Urinalysis    Component Value Date/Time   COLORURINE YELLOW 01/17/2019 [place] 01/17/2019 1541   LABSPEC 1.013 01/17/2019 1541   PHURINE 7.0 01/17/2019 1541   GLUCOSEU NEGATIVE 01/17/2019 [place] 05/31/2015 1351   HGBUR NEGATIVE 01/17/2019 [place] 11/25/2018 1459   BILIRUBINUR neg 09/17/2017 [place] 01/17/2019 [place] 01/17/2019 1541   UROBILINOGEN 0.2 11/25/2018 1459   NITRITE NEGATIVE 11/25/2018 1459   LEUKOCYTESUR NEGATIVE 11/25/2018 1459     ASSESSMENT: 58 y.o. [place] woman who is here today for evaluation of elevated hemoglobin.  1. Hemoglobin 16.2 1 month ago.  Epo level 12.1.    2. Jak-2 pending  PLAN: Sherial met with Dr. Lindi Adie who reviewed the etiology of erythrocytosis and discussed the difference between primary polycythemia versus secondary polycythemia.  He reviewed that Primary polycythemia is caused by a cell dysfunction in the bone marrow,  and that Jak 2 testing which is pending will evaluate for this.  He then reviewed that Secondary polycythemia, is increased hemoglobin caused by another cause--related to decreased oxygenation, causing the body to increase its hemoglobin level. Causes of this can include: cigarette smoking, COPD, sleep apnea, living at an increased elevation.    We reviewed the above with [place] in detail.  We will check her labs including a Jak 2 mutation to rule out primary polycthemia.  She will return in 2 weeks for f/u with Dr. Lindi Adie to review the results.    She was recommended to continue with the appropriate pandemic precautions. She knows to call for any questions that may arise between now and her next appointment.  We are happy to see her sooner if needed.   Wilber Bihari, NP   02/28/2019 4:09 PM Medical Oncology and Hematology [facility] [address] [place], [place] [address] Tel. [phone]    Fax. [phone]  Attending Note  I personally saw and examined Linus Orn  Griner. The plan of care was discussed with her. I agree with the assessment and plan as documented above. Mild erythrocytosis: I discussed with her the differential between primary versus secondary polycythemia. We will check Jak 2 mutation and MPL panel. Erythropoietin was normal ruling out any EPO secreting tumors. She may have some respiratory symptoms which could be the reason for the slight elevation of the red cell count. She will come back to see [place] in a couple of weeks to go over the results of this test. Signed Harriette Ohara, MD

## 2019-02-27 NOTE — Telephone Encounter (Signed)
Contrast is needed for this study. This study is what is needed.  Drinking plenty of water will keep kidneys flushed.

## 2019-02-27 NOTE — Telephone Encounter (Signed)
Pt was called and message was left for patient to return call.

## 2019-02-27 NOTE — Telephone Encounter (Signed)
Pt was called and given information on rechecking BMP/BP check at nurse visit .  Pelvis CT scan is scheduled for next week. Pt would like to know if the CT scan with contrast is safe given her kidney function? She has read that there are different contrasts that are safer or if there is even another test that can be done. Pt is worried about damaging kidneys even more. She has appt tomorrow with oncology to get transfusion but pt was unsure of the name of it.   Please advise

## 2019-02-27 NOTE — Progress Notes (Signed)
Elevated hgb

## 2019-02-28 ENCOUNTER — Inpatient Hospital Stay: Payer: BC Managed Care – PPO | Attending: Adult Health | Admitting: Adult Health

## 2019-02-28 ENCOUNTER — Inpatient Hospital Stay: Payer: BC Managed Care – PPO

## 2019-02-28 ENCOUNTER — Encounter: Payer: Self-pay | Admitting: Adult Health

## 2019-02-28 ENCOUNTER — Other Ambulatory Visit: Payer: Self-pay

## 2019-02-28 VITALS — BP 134/81 | HR 67 | Temp 98.0°F | Resp 18 | Ht 65.0 in | Wt 180.3 lb

## 2019-02-28 DIAGNOSIS — K589 Irritable bowel syndrome without diarrhea: Secondary | ICD-10-CM | POA: Insufficient documentation

## 2019-02-28 DIAGNOSIS — R194 Change in bowel habit: Secondary | ICD-10-CM | POA: Diagnosis not present

## 2019-02-28 DIAGNOSIS — D751 Secondary polycythemia: Secondary | ICD-10-CM | POA: Diagnosis not present

## 2019-02-28 DIAGNOSIS — N189 Chronic kidney disease, unspecified: Secondary | ICD-10-CM | POA: Insufficient documentation

## 2019-02-28 DIAGNOSIS — Z823 Family history of stroke: Secondary | ICD-10-CM | POA: Diagnosis not present

## 2019-02-28 DIAGNOSIS — Z806 Family history of leukemia: Secondary | ICD-10-CM | POA: Insufficient documentation

## 2019-02-28 DIAGNOSIS — Z7982 Long term (current) use of aspirin: Secondary | ICD-10-CM | POA: Diagnosis not present

## 2019-02-28 DIAGNOSIS — Z79899 Other long term (current) drug therapy: Secondary | ICD-10-CM | POA: Insufficient documentation

## 2019-02-28 DIAGNOSIS — D582 Other hemoglobinopathies: Secondary | ICD-10-CM

## 2019-02-28 DIAGNOSIS — M8580 Other specified disorders of bone density and structure, unspecified site: Secondary | ICD-10-CM | POA: Insufficient documentation

## 2019-02-28 DIAGNOSIS — J449 Chronic obstructive pulmonary disease, unspecified: Secondary | ICD-10-CM | POA: Insufficient documentation

## 2019-02-28 DIAGNOSIS — K909 Intestinal malabsorption, unspecified: Secondary | ICD-10-CM | POA: Diagnosis not present

## 2019-02-28 DIAGNOSIS — Z803 Family history of malignant neoplasm of breast: Secondary | ICD-10-CM | POA: Diagnosis not present

## 2019-02-28 LAB — CMP (CANCER CENTER ONLY)
ALT: 15 U/L (ref 0–44)
AST: 21 U/L (ref 15–41)
Albumin: 4 g/dL (ref 3.5–5.0)
Alkaline Phosphatase: 97 U/L (ref 38–126)
Anion gap: 10 (ref 5–15)
BUN: 11 mg/dL (ref 6–20)
CO2: 25 mmol/L (ref 22–32)
Calcium: 9.4 mg/dL (ref 8.9–10.3)
Chloride: 106 mmol/L (ref 98–111)
Creatinine: 0.95 mg/dL (ref 0.44–1.00)
GFR, Est AFR Am: >60 mL/min (ref >60)
GFR, Estimated: >60 mL/min (ref >60)
Glucose, Bld: 92 mg/dL (ref 70–99)
Potassium: 4.3 mmol/L (ref 3.5–5.1)
Sodium: 141 mmol/L (ref 135–145)
Total Bilirubin: 0.3 mg/dL (ref 0.3–1.2)
Total Protein: 7.4 g/dL (ref 6.5–8.1)

## 2019-02-28 LAB — CBC WITH DIFFERENTIAL (CANCER CENTER ONLY)
Abs Immature Granulocytes: 0.02 10*3/uL (ref 0.00–0.07)
Basophils Absolute: 0 10*3/uL (ref 0.0–0.1)
Basophils Relative: 1 %
Eosinophils Absolute: 0.1 10*3/uL (ref 0.0–0.5)
Eosinophils Relative: 1 %
HCT: 46.8 % — ABNORMAL HIGH (ref 36.0–46.0)
Hemoglobin: 15.6 g/dL — ABNORMAL HIGH (ref 12.0–15.0)
Immature Granulocytes: 0 %
Lymphocytes Relative: 22 %
Lymphs Abs: 1.5 10*3/uL (ref 0.7–4.0)
MCH: 32.3 pg (ref 26.0–34.0)
MCHC: 33.3 g/dL (ref 30.0–36.0)
MCV: 96.9 fL (ref 80.0–100.0)
Monocytes Absolute: 0.5 10*3/uL (ref 0.1–1.0)
Monocytes Relative: 8 %
Neutro Abs: 4.5 10*3/uL (ref 1.7–7.7)
Neutrophils Relative %: 68 %
Platelet Count: 278 10*3/uL (ref 150–400)
RBC: 4.83 MIL/uL (ref 3.87–5.11)
RDW: 12 % (ref 11.5–15.5)
WBC Count: 6.7 10*3/uL (ref 4.0–10.5)
nRBC: 0 % (ref 0.0–0.2)

## 2019-02-28 LAB — SAVE SMEAR(SSMR), FOR PROVIDER SLIDE REVIEW

## 2019-02-28 LAB — RETIC PANEL
Immature Retic Fract: 9 % (ref 2.3–15.9)
RBC.: 4.83 MIL/uL (ref 3.87–5.11)
Retic Count, Absolute: 50.2 10*3/uL (ref 19.0–186.0)
Retic Ct Pct: 1 % (ref 0.4–3.1)
Reticulocyte Hemoglobin: 36.9 pg (ref >27.9)

## 2019-02-28 NOTE — Telephone Encounter (Signed)
Pt was called and detailed message was left on patients VM with information

## 2019-02-28 NOTE — Addendum Note (Signed)
Addended by: Ralph Dowdy on: 02/28/2019 08:20 AM   Modules accepted: Orders

## 2019-03-01 ENCOUNTER — Emergency Department (HOSPITAL_COMMUNITY)
Admission: EM | Admit: 2019-03-01 | Discharge: 2019-03-01 | Disposition: A | Discharge status: Home / Self Care | Payer: BC Managed Care – PPO | Attending: Emergency Medicine | Admitting: Emergency Medicine

## 2019-03-01 ENCOUNTER — Telehealth: Payer: Self-pay | Admitting: Hematology and Oncology

## 2019-03-01 ENCOUNTER — Encounter: Payer: Self-pay | Admitting: Adult Health

## 2019-03-01 ENCOUNTER — Encounter (HOSPITAL_COMMUNITY): Payer: Self-pay | Admitting: Emergency Medicine

## 2019-03-01 ENCOUNTER — Telehealth: Payer: Self-pay | Admitting: Family Medicine

## 2019-03-01 ENCOUNTER — Ambulatory Visit (INDEPENDENT_AMBULATORY_CARE_PROVIDER_SITE_OTHER): Payer: BC Managed Care – PPO

## 2019-03-01 ENCOUNTER — Other Ambulatory Visit: Payer: Self-pay

## 2019-03-01 DIAGNOSIS — Y93H2 Activity, gardening and landscaping: Secondary | ICD-10-CM | POA: Insufficient documentation

## 2019-03-01 DIAGNOSIS — Z79899 Other long term (current) drug therapy: Secondary | ICD-10-CM | POA: Insufficient documentation

## 2019-03-01 DIAGNOSIS — Y999 Unspecified external cause status: Secondary | ICD-10-CM | POA: Diagnosis not present

## 2019-03-01 DIAGNOSIS — S61253A Open bite of left middle finger without damage to nail, initial encounter: Secondary | ICD-10-CM | POA: Diagnosis not present

## 2019-03-01 DIAGNOSIS — N183 Chronic kidney disease, stage 3 unspecified: Secondary | ICD-10-CM | POA: Diagnosis not present

## 2019-03-01 DIAGNOSIS — Y929 Unspecified place or not applicable: Secondary | ICD-10-CM | POA: Insufficient documentation

## 2019-03-01 DIAGNOSIS — I129 Hypertensive chronic kidney disease with stage 1 through stage 4 chronic kidney disease, or unspecified chronic kidney disease: Secondary | ICD-10-CM | POA: Insufficient documentation

## 2019-03-01 DIAGNOSIS — W5911XA Bitten by nonvenomous snake, initial encounter: Secondary | ICD-10-CM | POA: Diagnosis not present

## 2019-03-01 DIAGNOSIS — J45909 Unspecified asthma, uncomplicated: Secondary | ICD-10-CM | POA: Diagnosis not present

## 2019-03-01 DIAGNOSIS — T63001A Toxic effect of unspecified snake venom, accidental (unintentional), initial encounter: Secondary | ICD-10-CM | POA: Diagnosis not present

## 2019-03-01 LAB — BASIC METABOLIC PANEL
BUN: 11 mg/dL (ref 6–23)
CO2: 28 mEq/L (ref 19–32)
Calcium: 9.4 mg/dL (ref 8.4–10.5)
Chloride: 105 mEq/L (ref 96–112)
Creatinine, Ser: 0.9 mg/dL (ref 0.40–1.20)
GFR: 64.82 mL/min (ref >60.00)
Glucose, Bld: 89 mg/dL (ref 70–99)
Potassium: 3.9 mEq/L (ref 3.5–5.1)
Sodium: 141 mEq/L (ref 135–145)

## 2019-03-01 NOTE — Telephone Encounter (Signed)
Sent nurse visit to Dr Raoul Pitch to review

## 2019-03-01 NOTE — ED Provider Notes (Signed)
[facility] DEPT Provider Note   CSN: [id number] Arrival date & time: 03/01/19  1551     History   Chief Complaint Chief Complaint  Patient presents with  . Snake Bite    HPI Eltha Wurm is a 58 y.o. female.     58 yo F with a chief complaints of left hand pain.  Patient states that she was bit by a black snake.  She was pulling weeds and did not see it and then it bit onto her hand.  She then flung it off.  She has an area to her left middle finger where she was bit and a spot on her wrist.  She denies any significant swelling since the onset.  Has some erythema to those areas.  The history is provided by the patient.  Illness Severity:  Moderate Onset quality:  Gradual Duration:  2 days Timing:  Constant Progression:  Worsening Chronicity:  New Associated symptoms: no chest pain, no congestion, no fever, no headaches, no myalgias, no nausea, no rhinorrhea, no shortness of breath, no vomiting and no wheezing     Past Medical History:  Diagnosis Date  . Asthma   . Chronic bronchitis ([place])   . Chronic fatigue   . Fibromyalgia   . GERD (gastroesophageal reflux disease)   . Glaucoma   . Hypertension 2019  . IBS (irritable bowel syndrome)   . Osteopenia 09/2016   T score -1.3 FRAX 5.3%/0.3%  . Palpitations    echo and stress test normal 2015 per pt  . Urinary incontinence     Patient Active Problem List   Diagnosis Date Noted  . Pale feces 02/24/2019  . Fatty stools 02/24/2019  . Unintentional weight loss 02/24/2019  . Flushing 02/24/2019  . Hot flashes 02/24/2019  . Diarrhea 02/24/2019  . Abdominal pain 02/24/2019  . Bowel habit changes 02/24/2019  . Laryngopharyngeal reflux (LPR) 02/02/2019  . Easy bruising 01/26/2019  . CKD (chronic kidney disease) stage 3, GFR 30-59 ml/min 01/26/2019  . Dry mouth 01/26/2019  . Elevated hemoglobin ([place]) 01/26/2019  . Glaucoma 01/17/2019  . Genitourinary myiasis 12/13/2018  . Chronic  bronchitis ([place])   . Essential hypertension 07/25/2018  . Vitamin D deficiency 09/28/2016  . Arthralgia 09/28/2016  . 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 Postmenopausal 07/29/2015    Past Surgical History:  Procedure Laterality Date  . CHOLECYSTECTOMY    . TRANSTHORACIC ECHOCARDIOGRAM  11/2013   EF 50-55%, normal LV function, normal wall motion, triavial MR and mild tricuspid regurg.     OB History    Gravida  3   Para  3   Term      Preterm      AB      Living  3     SAB      TAB      Ectopic      Multiple      Live Births               Home Medications    Prior to Admission medications   Medication Sig Start Date End Date Taking? Authorizing Provider  albuterol (PROVENTIL HFA;VENTOLIN HFA) 108 (90 Base) MCG/ACT inhaler Inhale 1-2 puffs into the lungs every 6 (six) hours as needed for wheezing or shortness of breath. 05/02/18   Kuneff, Renee A, DO  amLODipine (NORVASC) 5 MG tablet Take 1 tablet (5 mg total) by mouth daily. 02/24/19   Kuneff, Renee A, DO  Ascorbic Acid (VITAMIN C) 500 MG  CAPS Take 2 tablets by mouth 2 (two) times daily.    [provider]  aspirin EC 81 MG tablet Take 81 mg by mouth 2 (two) times daily.    [provider]  co-enzyme Q-10 30 MG capsule Take 30 mg by mouth 3 (three) times daily.    [provider]  estradiol (ESTRACE) 0.1 MG/GM vaginal cream Place 1 Applicatorful vaginally at bedtime.    [provider]  estradiol-norethindrone [facility]) 0.05-0.14 MG/DAY Place 1 patch onto the skin 2 (two) times a week. Pt cuts patch in half    [provider]  Multiple Vitamins-Minerals (MULTIVITAMIN WITH MINERALS) tablet Take 1 tablet by mouth daily.    [provider]  vitamin B-12 (CYANOCOBALAMIN) 100 MCG tablet Take 100 mcg by mouth daily.    [provider]    Family History Family History  Problem Relation Age of Onset  . Hearing loss Mother   . Arthritis Father   . Hearing  loss Father   . Stroke Father   . Breast cancer Paternal Grandmother   . Leukemia Maternal Uncle     Social History Social History   Tobacco Use  . Smoking status: Never Smoker  . Smokeless tobacco: Never Used  Substance Use Topics  . Alcohol use: Not Currently    Comment: rare  . Drug use: No     Allergies   Patient has no known allergies.   Review of Systems Review of Systems  Constitutional: Negative for chills and fever.  HENT: Negative for congestion and rhinorrhea.   Eyes: Negative for redness and visual disturbance.  Respiratory: Negative for shortness of breath and wheezing.   Cardiovascular: Negative for chest pain and palpitations.  Gastrointestinal: Negative for nausea and vomiting.  Genitourinary: Negative for dysuria and urgency.  Musculoskeletal: Negative for arthralgias and myalgias.  Skin: Positive for wound. Negative for pallor.  Neurological: Negative for dizziness and headaches.     Physical Exam Updated Vital Signs BP (!) 179/109   Pulse (!) 122   Temp 99.4 F (37.4 C)   Resp 19   LMP 04/16/2016 (Approximate)   Physical Exam Vitals signs and nursing note reviewed.  Constitutional:      General: She is not in acute distress.    Appearance: She is well-developed. She is not diaphoretic.  HENT:     Head: Normocephalic and atraumatic.  Eyes:     Pupils: Pupils are equal, round, and reactive to light.  Neck:     Musculoskeletal: Normal range of motion and neck supple.  Cardiovascular:     Rate and Rhythm: Normal rate and regular rhythm.     Heart sounds: No murmur. No friction rub. No gallop.   Pulmonary:     Effort: Pulmonary effort is normal.     Breath sounds: No wheezing or rales.  Abdominal:     General: There is no distension.     Palpations: Abdomen is soft.     Tenderness: There is no abdominal tenderness.  Musculoskeletal:        General: Tenderness present.     Comments: Small indentations looking like multiple small teeth  on the dorsal aspects of the left second digit.  No break in the skin.  Mild surrounding erythema.  There is a small amount of erythema at the radial aspect of the wrist.  Skin:    General: Skin is warm and dry.  Neurological:     Mental Status: She is alert and oriented to  person, place, and time.  Psychiatric:        Behavior: Behavior normal.      ED Treatments / Results  Labs (all labs ordered are listed, but only abnormal results are displayed) Labs Reviewed - No data to display  EKG None  Radiology No results found.  Procedures Procedures (including critical care time)  Medications Ordered in ED Medications - No data to display   Initial Impression / Assessment and Plan / ED Course  I have reviewed the triage vital signs and the nursing notes.  Pertinent labs & imaging results that were available during my care of the patient were reviewed by me and considered in my medical decision making (see chart for details).        58 yo F with a chief complaint of a snake bite.  Patient describes a black snake which is likely a rattlesnake for this area.  She is pretty certain of what it is and actually has a picture of it.  I discussed with her if there is any indecision or if we thought this may be a copperhead then typically would withdraw lab work and watch her for 6 hours in the emergency department.  Patient is declining this.  She is pretty certain it was a rattlesnake.  She is comforted by the fact that that is not a venomous snake.  We will have her do local wound care at home.  Return for rapid spreading redness or fever.  4:28 PM:  I have discussed the diagnosis/risks/treatment options with the patient and believe the pt to be eligible for discharge home to follow-up with PCP. We also discussed returning to the ED immediately if new or worsening sx occur. We discussed the sx which are most concerning (e.g., sudden worsening pain, fever, inability to tolerate by mouth)  that necessitate immediate return. Medications administered to the patient during their visit and any new prescriptions provided to the patient are listed below.  Medications given during this visit Medications - No data to display   The patient appears reasonably screen and/or stabilized for discharge and I doubt any other medical condition or other [facility] requiring further screening, evaluation, or treatment in the ED at this time prior to discharge.    Final Clinical Impressions(s) / ED Diagnoses   Final diagnoses:  Snake bite, initial encounter    ED Discharge Orders    None       Deno Etienne, DO 03/01/19 1628

## 2019-03-01 NOTE — Telephone Encounter (Signed)
Pt was called and she would like to let Dr Raoul Pitch know that she had her BP check nurse visit today. She did check her BP when she got home and said 120/83 BP, 65 P. She thinks it was just high at the office today.

## 2019-03-01 NOTE — Progress Notes (Signed)
Pt was called and given information, she verbalized understanding  

## 2019-03-01 NOTE — ED Triage Notes (Signed)
Pt reports got bit by a black snake about 25 minutes ago on left hand

## 2019-03-01 NOTE — Progress Notes (Addendum)
Rebecca Pollard is a 58 y.o. female presents to the office today for Blood pressure recheck secondary to switching to amlodipine.   Blood pressure medication: amlodipine 5 mg.  If on medication, Last dose was at least 1-2 hours prior to recheck: no, patient has not taken medication today.   Blood pressure was taken in the left arm after patient rested for 5 minutes.  BP 140/84 (BP Location: Left Arm, Patient Position: Sitting, Cuff Size: Normal)   LMP 04/16/2016 (Approximate)   Melody Cirrincione, CMA  _______________________________________________ Please inform patient the following information: Her kidney function has returned to normal by today's lab- stool studies pending and will not be resulted until early next week!  Her BP here is borderline (the 1st one), but home reading is perfect. Her BP was ok when here the other day.  - would suggest staying on the amlodipine at current dose and next appt bring her cuff with her so we can make sure it is accurate.  - f/u 3  Mos BRING CUFF, continue to monitor at home and if > 130/80, then follow up sooner.

## 2019-03-01 NOTE — Telephone Encounter (Addendum)
Please forward nurse BP check note to me to review. I have yet to receive it- and then a nurse will her call her back with recommendations from this provider concerning her nurse visit.

## 2019-03-01 NOTE — Discharge Instructions (Addendum)
If you were indeed bit by a black snake then it is very unlikely to be poisonous.  Please keep an eye on this at home.  The risk of infection from a bite from this is very low.  Please keep this clean and dry.  Please return for rapid spreading redness or swelling.  Follow-up with your family doctor.

## 2019-03-01 NOTE — Telephone Encounter (Signed)
Was here earlier for BP check with nurse. Requests to speak with Dr. Raoul Pitch only about her blood pressure.  Patient can be reached at [phone]

## 2019-03-01 NOTE — Telephone Encounter (Signed)
I talk with patient regarding schedule  

## 2019-03-02 ENCOUNTER — Encounter: Payer: Self-pay | Admitting: Physician Assistant

## 2019-03-04 LAB — STOOL CULTURE
MICRO NUMBER:: 960697
MICRO NUMBER:: 960698
MICRO NUMBER:: 960701
SHIGA RESULT:: NOT DETECTED
SPECIMEN QUALITY:: ADEQUATE
SPECIMEN QUALITY:: ADEQUATE
SPECIMEN QUALITY:: ADEQUATE

## 2019-03-04 LAB — OVA AND PARASITE EXAMINATION
CONCENTRATE RESULT:: NONE SEEN
MICRO NUMBER:: 960700
SPECIMEN QUALITY:: ADEQUATE
TRICHROME RESULT:: NONE SEEN

## 2019-03-04 LAB — FECAL LACTOFERRIN, QUANT
Fecal Lactoferrin: NEGATIVE
MICRO NUMBER:: 960699
SPECIMEN QUALITY:: ADEQUATE

## 2019-03-04 LAB — FECAL FAT, QUALITATIVE: FECAL FAT, QUALITATIVE: NORMAL

## 2019-03-06 ENCOUNTER — Ambulatory Visit
Admission: RE | Admit: 2019-03-06 | Discharge: 2019-03-06 | Disposition: A | Discharge status: Home / Self Care | Payer: BC Managed Care – PPO | Source: Ambulatory Visit | Attending: Family Medicine | Admitting: Family Medicine

## 2019-03-06 DIAGNOSIS — R634 Abnormal weight loss: Secondary | ICD-10-CM

## 2019-03-06 DIAGNOSIS — R194 Change in bowel habit: Secondary | ICD-10-CM

## 2019-03-06 DIAGNOSIS — R197 Diarrhea, unspecified: Secondary | ICD-10-CM

## 2019-03-06 DIAGNOSIS — N281 Cyst of kidney, acquired: Secondary | ICD-10-CM | POA: Diagnosis not present

## 2019-03-06 DIAGNOSIS — K909 Intestinal malabsorption, unspecified: Secondary | ICD-10-CM

## 2019-03-06 DIAGNOSIS — R109 Unspecified abdominal pain: Secondary | ICD-10-CM

## 2019-03-06 DIAGNOSIS — R232 Flushing: Secondary | ICD-10-CM

## 2019-03-06 DIAGNOSIS — R195 Other fecal abnormalities: Secondary | ICD-10-CM

## 2019-03-06 MED ORDER — IOPAMIDOL (ISOVUE-300) INJECTION 61%
100.0000 mL | Freq: Once | INTRAVENOUS | Status: AC | PRN
  Administered 2019-03-06: 100 mL via INTRAVENOUS

## 2019-03-07 ENCOUNTER — Telehealth: Payer: Self-pay | Admitting: Family Medicine

## 2019-03-07 DIAGNOSIS — K838 Other specified diseases of biliary tract: Secondary | ICD-10-CM | POA: Insufficient documentation

## 2019-03-07 NOTE — Telephone Encounter (Signed)
Pt was called and message was left to return call  

## 2019-03-07 NOTE — Telephone Encounter (Signed)
Please inform patient the following information: - Her stool studies were normal. I would suggest She follow with GI- that referral was placed and she should have had a call to schedule with them. If not please check into the referral for her.  - Her CT did show evidence air within the biliary tree of the liver, which suggests an abnormal communication between the drainage system of the liver and gallbladder (which she had removed) and the intestines, or infection by gas-forming bacteria. >> GI will review at her appt and take any needed next steps. This may be the cause of her  Discomfort.  - staff: please make sure her CT, labs and last 2 OV notes are sent to GI.   Thanks!

## 2019-03-07 NOTE — Telephone Encounter (Signed)
Pt was called and given lab results. Pt is seeing [facility] on Thursday.

## 2019-03-08 ENCOUNTER — Telehealth: Payer: Self-pay

## 2019-03-08 NOTE — Telephone Encounter (Signed)
Left message on personal VM informing that Jak2 is negative.  Left phone number on vm to call back with any questions.

## 2019-03-09 ENCOUNTER — Encounter: Payer: Self-pay | Admitting: Physician Assistant

## 2019-03-09 ENCOUNTER — Ambulatory Visit: Payer: BC Managed Care – PPO | Admitting: Physician Assistant

## 2019-03-09 ENCOUNTER — Other Ambulatory Visit: Payer: Self-pay

## 2019-03-09 VITALS — BP 138/78 | HR 71 | Temp 98.5°F | Ht 65.0 in | Wt 173.0 lb

## 2019-03-09 DIAGNOSIS — K921 Melena: Secondary | ICD-10-CM

## 2019-03-09 DIAGNOSIS — R109 Unspecified abdominal pain: Secondary | ICD-10-CM | POA: Diagnosis not present

## 2019-03-09 DIAGNOSIS — R935 Abnormal findings on diagnostic imaging of other abdominal regions, including retroperitoneum: Secondary | ICD-10-CM

## 2019-03-09 DIAGNOSIS — R194 Change in bowel habit: Secondary | ICD-10-CM

## 2019-03-09 DIAGNOSIS — Z1212 Encounter for screening for malignant neoplasm of rectum: Secondary | ICD-10-CM

## 2019-03-09 DIAGNOSIS — Z1211 Encounter for screening for malignant neoplasm of colon: Secondary | ICD-10-CM | POA: Diagnosis not present

## 2019-03-09 NOTE — Patient Instructions (Addendum)
If you are age 56 or older, your body mass index should be between 23-30. Your Body mass index is 28.79 kg/m. If this is out of the aforementioned range listed, please consider follow up with your Primary Care Provider.  If you are age 73 or younger, your body mass index should be between 19-25. Your Body mass index is 28.79 kg/m. If this is out of the aformentioned range listed, please consider follow up with your Primary Care Provider.   It has been recommended to you by your physician that you have an Endo/Colon completed. Per your request, we did not schedule the procedure(s) today. Please contact our office at [phone] should you decide to have the procedure completed. You will be scheduled for a pre-visit and procedure at that time.   Thank you for choosing me and [facility].    Ellouise Newer, PA-C .dottien

## 2019-03-09 NOTE — Progress Notes (Signed)
Chief Complaint: Abdominal pain, black oily stools  HPI:    Mrs. Rebecca Pollard is a 58 year old Caucasian female with a past medical history as listed below, who was referred to me by Howard Pouch A, DO for multiple complaints including abdominal pain and black oily stools.      01/19/2019 ultrasound renal with no evidence of hydronephrosis, apparent 1 cyst of the right kidney, unremarkable appearance of the bladder.    02/28/2019 BMP normal.  CBC with a hemoglobin minimally elevated at 15.6.  Fecal fat, ova and parasites, white blood cell and stool and stool culture all negative/normal.  Lipase normal.  01/31/2019 ANA was positive.    03/06/2019 CT abdomen pelvis with contrast shows no specific findings to explain the patient's current abdominal pain.  There was formed stool distally in the colon without definite air-fluid levels.  Pneumobilia with question of prior sphincterotomy and aortic atherosclerosis.    Patient has had work-up by hematology recently for elevated hemoglobin, they ruled out polycythemia vera.  He is still following with them.     Today, the patient presents clinic accompanied by her husband who assists with her history.  She has multiple complaints over the past 6 months.  Starts by telling me that it all started when she was getting bruises all over her body for no real reason and they were quite large.  This is when her work-up started.      One concern today is that she had a CT which showed "air in my liver".  Does describe a history of a cholecystectomy about 20 years ago with a sphincterotomy for cholelithiasis.    Also describes today that she has had 3 episodes of stool which is very greasy with "black ink dots in it" in the toilet after eating greasy meals.  Does describe that it was very black looking.    Also describes today that she has some right-sided abdominal pain which is more on her right flank and can radiate around to her back, though she is not sure the 2 pains are  connected.  The right-sided pain is mostly constant and then she can have pain more towards her back which is severe and breath taking and it can happen 2 or 3 times in a day and then not happen for 5 to 7 days.  Also describes an increase in thirst.  Also tells me she has had a change in appetite.  Tells me she has had a decrease in appetite for the past many years since cholecystectomy but now it is back some when she is doing some intermittent fasting.  Associated symptoms include a weight loss of around 10 pounds over the past 6 to 8 weeks.    Tells me she is concerned about her hemoglobin because she was told this may mean that her body is compensating and needs more "oxygen".  She is worried about having any procedures because of all this that is going on.  Also tells me that she feels like "there is something in my lungs still".  Apparently had infection in January was on 2 different antibiotics but continues with a cough and some thick mucus.    Social history positive for possibly going back to [place] in November to see her sick parent.    Denies fever, chills or symptoms that awaken her from sleep.  Past Medical History:  Diagnosis Date   Asthma    Chronic bronchitis (HCC)    Chronic fatigue  Fibromyalgia    GERD (gastroesophageal reflux disease)    Glaucoma    Hypertension 2019   IBS (irritable bowel syndrome)    Osteopenia 09/2016   T score -1.3 FRAX 5.3%/0.3%   Palpitations    echo and stress test normal 2015 per pt   Urinary incontinence     Past Surgical History:  Procedure Laterality Date   CHOLECYSTECTOMY     TRANSTHORACIC ECHOCARDIOGRAM  11/2013   EF 50-55%, normal LV function, normal wall motion, triavial MR and mild tricuspid regurg.    Current Outpatient Medications  Medication Sig Dispense Refill   albuterol (PROVENTIL HFA;VENTOLIN HFA) 108 (90 Base) MCG/ACT inhaler Inhale 1-2 puffs into the lungs every 6 (six) hours as needed for wheezing or  shortness of breath. 1 Inhaler 0   amLODipine (NORVASC) 5 MG tablet Take 1 tablet (5 mg total) by mouth daily. 90 tablet 1   Ascorbic Acid (VITAMIN C) 500 MG CAPS Take 2 tablets by mouth 2 (two) times daily.     aspirin EC 81 MG tablet Take 81 mg by mouth 2 (two) times daily.     co-enzyme Q-10 30 MG capsule Take 30 mg by mouth 3 (three) times daily.     estradiol (ESTRACE) 0.1 MG/GM vaginal cream Place 1 Applicatorful vaginally at bedtime.     Multiple Vitamins-Minerals (MULTIVITAMIN WITH MINERALS) tablet Take 1 tablet by mouth daily.     vitamin B-12 (CYANOCOBALAMIN) 100 MCG tablet Take 100 mcg by mouth daily.     No current facility-administered medications for this visit.     Allergies as of 03/09/2019 - Review Complete 03/09/2019  Allergen Reaction Noted   Losartan  03/01/2019    Family History  Problem Relation Age of Onset   Hearing loss Mother    Arthritis Father    Hearing loss Father    Stroke Father    Breast cancer Paternal Grandmother    Leukemia Maternal Grandmother     Social History   Socioeconomic History   Marital status: Married    Spouse name: Eddie Dibbles   Number of children: 3   Years of education: Not on file   Highest education level: Not on file  Occupational History   Occupation: Merchandiser, retail strain: Not on file   Food insecurity    Worry: Not on file    Inability: Not on file   Transportation needs    Medical: Not on file    Non-medical: Not on file  Tobacco Use   Smoking status: Never Smoker   Smokeless tobacco: Never Used  Substance and Sexual Activity   Alcohol use: Not Currently    Comment: rare   Drug use: No   Sexual activity: Yes    Partners: Male    Birth control/protection: Other-see comments    Comment: 1st intercourse- 51, patners- 10, married- 28 yrs   Lifestyle   Physical activity    Days per week: Not on file    Minutes per session: Not on file   Stress:  Not on file  Relationships   Social connections    Talks on phone: Not on file    Gets together: Not on file    Attends religious service: Not on file    Active member of club or organization: Not on file    Attends meetings of clubs or organizations: Not on file    Relationship status: Not on file   Intimate partner violence  Fear of current or ex partner: Not on file    Emotionally abused: Not on file    Physically abused: Not on file    Forced sexual activity: Not on file  Other Topics Concern   Not on file  Social History Narrative   Married to [place]. 2 children, Alice and [place].    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    Drinks caffeine, herbal remedies, daily vitamin use.    Wears her seatbelt, smoke detector in the home, no firearms in the home.    Feels safe in her relationships.    Exercises 2x a week.     Review of Systems:    Constitutional: No fever or chills Skin: No rash Cardiovascular: No chest pain Respiratory: +cough Gastrointestinal: See HPI and otherwise negative Genitourinary: No dysuria  Neurological: No headache Musculoskeletal: No new muscle or joint pain Hematologic: +bruising Psychiatric: No history of depression or anxiety   Physical Exam:  Vital signs: BP 138/78    Pulse 71    Temp 98.5 F (36.9 C)    Ht 5' 5"  (1.651 m)    Wt 173 lb (78.5 kg)    LMP 04/16/2016 (Approximate)    BMI 28.79 kg/m   Constitutional:   Caucasian female appears to be in NAD, Well developed, Well nourished, alert and cooperative Head:  Normocephalic and atraumatic. Eyes:   PEERL, EOMI. No icterus. Conjunctiva pink. Ears:  Normal auditory acuity. Neck:  Supple Throat: Oral cavity and pharynx without inflammation, swelling or lesion.  Respiratory: Respirations even and unlabored. Lungs clear to auscultation bilaterally.   No wheezes, crackles, or rhonchi.  Cardiovascular: Normal S1, S2. No MRG. Regular rate and rhythm. No peripheral edema, cyanosis or  pallor.  Gastrointestinal:  Soft, nondistended,Mild right flank pain. No rebound or guarding. Normal bowel sounds. No appreciable masses or hepatomegaly.+some small yellowing ecchymosis on abdomen Rectal:  Not performed.  Msk:  Symmetrical without gross deformities. Without edema, no deformity or joint abnormality.  Neurologic:  Alert and  oriented x4;  grossly normal neurologically.  Skin:   Dry and intact without significant lesions or rashes. Psychiatric: Demonstrates good judgement and reason without abnormal affect or behaviors.  RELEVANT LABS AND IMAGING: CBC    Component Value Date/Time   WBC 6.7 02/28/2019 1010   WBC 8.8 01/17/2019 1540   RBC 4.83 02/28/2019 1010   RBC 4.83 02/28/2019 1010   HGB 15.6 (H) 02/28/2019 1010   HCT 46.8 (H) 02/28/2019 1010   PLT 278 02/28/2019 1010   MCV 96.9 02/28/2019 1010   MCH 32.3 02/28/2019 1010   MCHC 33.3 02/28/2019 1010   RDW 12.0 02/28/2019 1010   LYMPHSABS 1.5 02/28/2019 1010   MONOABS 0.5 02/28/2019 1010   EOSABS 0.1 02/28/2019 1010   BASOSABS 0.0 02/28/2019 1010    CMP     Component Value Date/Time   NA 141 03/01/2019 0851   K 3.9 03/01/2019 0851   CL 105 03/01/2019 0851   CO2 28 03/01/2019 0851   GLUCOSE 89 03/01/2019 0851   BUN 11 03/01/2019 0851   CREATININE 0.90 03/01/2019 0851   CREATININE 0.95 02/28/2019 1010   CREATININE 0.92 07/27/2016 1556   CALCIUM 9.4 03/01/2019 0851   PROT 7.4 02/28/2019 1010   ALBUMIN 4.0 02/28/2019 1010   AST 21 02/28/2019 1010   ALT 15 02/28/2019 1010   ALKPHOS 97 02/28/2019 1010   BILITOT 0.3 02/28/2019 1010   GFRNONAA >60 02/28/2019 1010   GFRNONAA 71 07/27/2016 1556   GFRAA >60  02/28/2019 1010   GFRAA 82 07/27/2016 1556    Assessment: 1.  Black stool: Per patient with oily stool on 3 occasions can; consider upper GI bleed versus other 2.  Change in bowel habits: Patient did have some diarrhea recently, stools studies were all normal/negative, now stools are not as loose, but  does describe some black stool which is greasy after eating greasy meals, patient's concern is over her pancreas; consider relation to postcholecystectomy versus upper GI bleed versus other 3.  Screening for colorectal cancer: Patient is 11 and never had a screening colonoscopy 4.  Right-sided abdominal pain: In her right flank and radiates around to back, previous renal ultrasound was normal, CT did not show any finding for pain; consider IBS versus musculoskeletal versus adhesions from prior cholecystectomy versus gastric origin versus other 5.  Abnormal CT the abdomen: Discussed pneumobilia, patient did have a previous cholecystectomy and ERCP with sphincterotomy about 20 years ago explained to patient that this can be a normal finding after such a procedure  Plan: 1.  Discussed with the patient that I am not sure that all of her symptoms are related to eachother.  She is very nervous about having procedures given that she is undergoing hematology work-up.  Explained that my recommendation would be for her to have an EGD and colonoscopy given her myriad of complaints and specifically description of black stool and a change in bowel habits.  She understands and believes she will likely have these procedures but is not sure when.  I told her that there are risks of delaying these procedures.  She is aware.  She will call us back when she would like to schedule them.  At that point can come in for nurse pre-visit for an EGD and colonoscopy with Dr. Rush Landmark in the [facility]. 2.  Encouraged the patient to continue her work-up with hematology and her PCP. 3.  Patient to follow in clinic with [place] for procedures as indicated above when she is able.  Ellouise Newer, PA-C [facility] 03/09/2019, 10:25 AM  Cc: Howard Pouch A, DO

## 2019-03-13 NOTE — Progress Notes (Signed)
Attending Physician's Attestation   I have reviewed the chart.   I agree with the Advanced Practitioner's note, impression, and recommendations with any updates as below.  Patient will benefit from a diagnostic endoscopic evaluation from above and below.  Hopefully her symptoms abate but even if they do she may benefit from the diagnostic work-up.  We will await her decision as to timing of procedures when she calls and for follow-up.  Justice Britain, MD [facility] Advanced Endoscopy Office # [phone]

## 2019-03-14 LAB — JAK2 (INCLUDING V617F AND EXON 12), MPL,& CALR-NEXT GEN SEQ

## 2019-03-14 NOTE — Progress Notes (Signed)
Patient Care Team: Ma Hillock, DO as PCP - General (Family Medicine) Michel Bickers, MD as Consulting Physician (Infectious Diseases)  DIAGNOSIS:    ICD-10-CM   1. Erythrocytosis  D75.1     CHIEF COMPLIANT: Follow-up to review labs  INTERVAL HISTORY: Rebecca Pollard is a 58 y.o. with above-mentioned history of elevated hemoglobin. Labs on 02/28/19 showed: Hg 15.6, HCT 46.8, MCV 96.9, retic count 1.0%. She presents to the clinic today to review her labs and discuss treatment options.  Patient has had extensive work-up for vague abdominal symptoms.  She had a CT of the abdomen which did not show any abnormalities pressure ultrasound of the kidneys which was normal.  REVIEW OF SYSTEMS:   Constitutional: Denies fevers, chills or abnormal weight loss Eyes: Denies blurriness of vision Ears, nose, mouth, throat, and face: Denies mucositis or sore throat Respiratory: Denies cough, dyspnea or wheezes Cardiovascular: Denies palpitation, chest discomfort Gastrointestinal: Abdominal discomfort Skin: Denies abnormal skin rashes Lymphatics: Denies new lymphadenopathy or easy bruising Neurological: Denies numbness, tingling or new weaknesses Behavioral/Psych: Mood is stable, no new changes  Extremities: No lower extremity edema Breast: denies any pain or lumps or nodules in either breasts All other systems were reviewed with the patient and are negative.  I have reviewed the past medical history, past surgical history, social history and family history with the patient and they are unchanged from previous note.  ALLERGIES:  is allergic to losartan.  MEDICATIONS:  Current Outpatient Medications  Medication Sig Dispense Refill  . albuterol (PROVENTIL HFA;VENTOLIN HFA) 108 (90 Base) MCG/ACT inhaler Inhale 1-2 puffs into the lungs every 6 (six) hours as needed for wheezing or shortness of breath. 1 Inhaler 0  . amLODipine (NORVASC) 5 MG tablet Take 1 tablet (5 mg total) by mouth daily. 90  tablet 1  . Ascorbic Acid (VITAMIN C) 500 MG CAPS Take 2 tablets by mouth 2 (two) times daily.    Marland Kitchen aspirin EC 81 MG tablet Take 81 mg by mouth 2 (two) times daily.    Marland Kitchen co-enzyme Q-10 30 MG capsule Take 30 mg by mouth 3 (three) times daily.    Marland Kitchen estradiol (ESTRACE) 0.1 MG/GM vaginal cream Place 1 Applicatorful vaginally at bedtime.    . Multiple Vitamins-Minerals (MULTIVITAMIN WITH MINERALS) tablet Take 1 tablet by mouth daily.    . vitamin B-12 (CYANOCOBALAMIN) 100 MCG tablet Take 100 mcg by mouth daily.     No current facility-administered medications for this visit.     PHYSICAL EXAMINATION: ECOG PERFORMANCE STATUS: 1 - Symptomatic but completely ambulatory  There were no vitals filed for this visit. There were no vitals filed for this visit.  GENERAL: alert, no distress and comfortable SKIN: skin color, texture, turgor are normal, no rashes or significant lesions EYES: normal, Conjunctiva are pink and non-injected, sclera clear OROPHARYNX: no exudate, no erythema and lips, buccal mucosa, and tongue normal  NECK: supple, thyroid normal size, non-tender, without nodularity LYMPH: no palpable lymphadenopathy in the cervical, axillary or inguinal LUNGS: clear to auscultation and percussion with normal breathing effort HEART: regular rate & rhythm and no murmurs and no lower extremity edema ABDOMEN: abdomen soft, non-tender and normal bowel sounds MUSCULOSKELETAL: no cyanosis of digits and no clubbing  NEURO: alert & oriented x 3 with fluent speech, no focal motor/sensory deficits EXTREMITIES: No lower extremity edema  LABORATORY DATA:  I have reviewed the data as listed CMP Latest Ref Rng & Units 03/01/2019 02/28/2019 02/24/2019  Glucose 70 - 99 mg/dL  89 92 92  BUN 6 - 23 mg/dL 11 11 10   Creatinine 0.40 - 1.20 mg/dL 0.90 0.95 1.00  Sodium 135 - 145 mEq/L 141 141 144  Potassium 3.5 - 5.1 mEq/L 3.9 4.3 4.2  Chloride 96 - 112 mEq/L 105 106 104  CO2 19 - 32 mEq/L 28 25 30   Calcium  8.4 - 10.5 mg/dL 9.4 9.4 10.2  Total Protein 6.5 - 8.1 g/dL - 7.4 7.5  Total Bilirubin 0.3 - 1.2 mg/dL - 0.3 0.7  Alkaline Phos 38 - 126 U/L - 97 109  AST 15 - 41 U/L - 21 20  ALT 0 - 44 U/L - 15 16    Lab Results  Component Value Date   WBC 6.7 02/28/2019   HGB 15.6 (H) 02/28/2019   HCT 46.8 (H) 02/28/2019   MCV 96.9 02/28/2019   PLT 278 02/28/2019   NEUTROABS 4.5 02/28/2019    ASSESSMENT & PLAN:  Erythrocytosis Mild erythrocytosis: 02/28/2019: Hemoglobin 15.6, hematocrit 46.8 rest of the CBC normal MPN panel: No JAK2 mutation detected (MPL and CAL R Neg)  Diagnosis: Secondary erythrocytosis.  Possible differential secondary to obstructive sleep apnea versus another hypoxia generating cause. Erythropoietin levels have not been drawn previously.  We will draw those labs today.  Return to clinic in 6 months for follow-up with labs.  If these labs are normal then we can see her on an as-needed basis.  I discussed with her about donating blood at [facility] as another means to reduce her RBCs.  No orders of the defined types were placed in this encounter.  The patient has a good understanding of the overall plan. she agrees with it. she will call with any problems that may develop before the next visit here.  Nicholas Lose, MD 03/15/2019  Julious Oka Dorshimer am acting as scribe for Dr. Nicholas Lose.  I have reviewed the above documentation for accuracy and completeness, and I agree with the above.

## 2019-03-15 ENCOUNTER — Inpatient Hospital Stay (HOSPITAL_BASED_OUTPATIENT_CLINIC_OR_DEPARTMENT_OTHER): Payer: BC Managed Care – PPO | Admitting: Hematology and Oncology

## 2019-03-15 ENCOUNTER — Other Ambulatory Visit: Payer: Self-pay

## 2019-03-15 ENCOUNTER — Encounter: Payer: Self-pay | Admitting: Family Medicine

## 2019-03-15 ENCOUNTER — Ambulatory Visit (INDEPENDENT_AMBULATORY_CARE_PROVIDER_SITE_OTHER): Payer: BC Managed Care – PPO | Admitting: Family Medicine

## 2019-03-15 ENCOUNTER — Inpatient Hospital Stay: Payer: BC Managed Care – PPO

## 2019-03-15 VITALS — BP 110/79 | HR 82 | Temp 98.3°F | Resp 17 | Ht 65.0 in | Wt 177.4 lb

## 2019-03-15 DIAGNOSIS — R232 Flushing: Secondary | ICD-10-CM

## 2019-03-15 DIAGNOSIS — R194 Change in bowel habit: Secondary | ICD-10-CM

## 2019-03-15 DIAGNOSIS — R197 Diarrhea, unspecified: Secondary | ICD-10-CM

## 2019-03-15 DIAGNOSIS — Z79899 Other long term (current) drug therapy: Secondary | ICD-10-CM | POA: Diagnosis not present

## 2019-03-15 DIAGNOSIS — Z7982 Long term (current) use of aspirin: Secondary | ICD-10-CM | POA: Diagnosis not present

## 2019-03-15 DIAGNOSIS — R233 Spontaneous ecchymoses: Secondary | ICD-10-CM

## 2019-03-15 DIAGNOSIS — R682 Dry mouth, unspecified: Secondary | ICD-10-CM

## 2019-03-15 DIAGNOSIS — K589 Irritable bowel syndrome without diarrhea: Secondary | ICD-10-CM | POA: Diagnosis not present

## 2019-03-15 DIAGNOSIS — K909 Intestinal malabsorption, unspecified: Secondary | ICD-10-CM

## 2019-03-15 DIAGNOSIS — D751 Secondary polycythemia: Secondary | ICD-10-CM

## 2019-03-15 DIAGNOSIS — D582 Other hemoglobinopathies: Secondary | ICD-10-CM

## 2019-03-15 DIAGNOSIS — R238 Other skin changes: Secondary | ICD-10-CM

## 2019-03-15 DIAGNOSIS — Z806 Family history of leukemia: Secondary | ICD-10-CM | POA: Diagnosis not present

## 2019-03-15 DIAGNOSIS — M8580 Other specified disorders of bone density and structure, unspecified site: Secondary | ICD-10-CM | POA: Diagnosis not present

## 2019-03-15 DIAGNOSIS — R634 Abnormal weight loss: Secondary | ICD-10-CM

## 2019-03-15 DIAGNOSIS — R109 Unspecified abdominal pain: Secondary | ICD-10-CM

## 2019-03-15 DIAGNOSIS — K838 Other specified diseases of biliary tract: Secondary | ICD-10-CM

## 2019-03-15 DIAGNOSIS — Z803 Family history of malignant neoplasm of breast: Secondary | ICD-10-CM | POA: Diagnosis not present

## 2019-03-15 DIAGNOSIS — J449 Chronic obstructive pulmonary disease, unspecified: Secondary | ICD-10-CM | POA: Diagnosis not present

## 2019-03-15 DIAGNOSIS — N189 Chronic kidney disease, unspecified: Secondary | ICD-10-CM | POA: Diagnosis not present

## 2019-03-15 DIAGNOSIS — Z823 Family history of stroke: Secondary | ICD-10-CM | POA: Diagnosis not present

## 2019-03-15 LAB — CBC WITH DIFFERENTIAL (CANCER CENTER ONLY)
Abs Immature Granulocytes: 0.02 10*3/uL (ref 0.00–0.07)
Basophils Absolute: 0.1 10*3/uL (ref 0.0–0.1)
Basophils Relative: 1 %
Eosinophils Absolute: 0.1 10*3/uL (ref 0.0–0.5)
Eosinophils Relative: 1 %
HCT: 46.4 % — ABNORMAL HIGH (ref 36.0–46.0)
Hemoglobin: 15.4 g/dL — ABNORMAL HIGH (ref 12.0–15.0)
Immature Granulocytes: 0 %
Lymphocytes Relative: 23 %
Lymphs Abs: 1.8 10*3/uL (ref 0.7–4.0)
MCH: 32.2 pg (ref 26.0–34.0)
MCHC: 33.2 g/dL (ref 30.0–36.0)
MCV: 96.9 fL (ref 80.0–100.0)
Monocytes Absolute: 0.6 10*3/uL (ref 0.1–1.0)
Monocytes Relative: 8 %
Neutro Abs: 5.2 10*3/uL (ref 1.7–7.7)
Neutrophils Relative %: 67 %
Platelet Count: 288 10*3/uL (ref 150–400)
RBC: 4.79 MIL/uL (ref 3.87–5.11)
RDW: 12.3 % (ref 11.5–15.5)
WBC Count: 7.7 10*3/uL (ref 4.0–10.5)
nRBC: 0 % (ref 0.0–0.2)

## 2019-03-15 NOTE — Assessment & Plan Note (Signed)
Mild erythrocytosis: 02/28/2019: Hemoglobin 15.6, hematocrit 46.8 rest of the CBC normal MPN panel: No JAK2 mutation detected (MPL and CAL R Neg)  Diagnosis: Secondary erythrocytosis.  Possible differential secondary to obstructive sleep apnea versus another hypoxia generating cause. Erythropoietin levels have not been drawn previously.  Return to clinic in 3 months for follow-up with labs

## 2019-03-15 NOTE — Progress Notes (Signed)
[name] , 1960-11-23, 58 y.o., female MRN: [id number] Patient Care Team    Relationship Specialty Notifications Start End  Ma Hillock, DO PCP - General Family Medicine  05/31/15   Michel Bickers, MD Consulting Physician Infectious Diseases  01/23/19     Chief Complaint  Patient presents with  . Review CT    Pt went ot hematologist this morning and had lab work. Pt would like to discuss the CT scan.      Subjective:  Rebecca Pollard is a 58 y.o. female present today to discuss her CT results.  She has now established with GI and HEME. She was called with all results, and established with GI and HEME after, however she has some questions she would like discuss today in more detail and obtain this providers opinion on recommendations. She also reports she has not heard from the pulm office to schedule as of yet- referral placed 02/06/2019.  Prior note:   recurrence of black oily stool.  Patient reports she had a recurrence of the oily black substance in her stool.  She has ongoing diffuse abdominal discomfort now greater than 10 weeks, with increased right abdomen/flank discomfort that she states now is also radiating around her back and having radiation to her right shoulder.  She has a history of cholecystectomy.  She also endorses pale-colored stools that are soft but formed.  She reports after eating homemade chicken noodle soup she again had a recurrence of the black oily substance in her stool.  She brings a picture with her today-with normal consistency, although mildly pale stools with multiple size drops, largest about the size of a quarter of floating, clinging to the sides of the bowl black oily drops.  She is still endorsing the hot flashes, sweating and flushing.  She has appointment with hematology next week.  She has had unintentional weight loss over the last month. Prior note: - her [place] of her kidneys was normal with the exception of a small cyst of her right kidney.  This  has been chronic and stable.  - mildly lower than normal kidney function and elevated hemoglobin-both are which are new for her and started with her symptoms onset July 3-losartan was also started at that time and replaced lisinopril (which was stopped secondary to chronic cough). Patient has had multiple complaints and symptoms over the the last 2-3 months of easy bruising, shortness of breath which she states feels like she has COPD, right flank pain, burning chest and metallic taste, chronic cough, increased thirst, dry mouth and most recently GI complaints of loose/tarry/oily stools.  She reports her flank pain actually feels "better with hydration.  "She states she has been pushing the water and is drinking greater than 80 ounces a day.  She reports no more issues as far as passing worms in her urine since the use of ivermectin and doxycycline.  Prior note:  Pt presents for an OV with complaints of right flank pain and bruising after completing an ivermectin course prescribed by infectious disease for possible genitourinary myiasis.  Patient has been seeing infectious disease since noting flies and warmth in her urine since November 25, 2018.  There has been some concern for genitourinary myiasis given her symptoms, however this has not been able to be proven diagnostically as of yet.  Her urine ova and parasite were negative.  There has been flies and worms visualized in her urine.  She was supplied with an ivermectin and doxycycline course  in hopes to treat her  condition.  She states she did feel better and has not seen any evidence of parasites since the course of ivermectin/doxycycline.  However she started to experience right flank pain at the end of the course of ivermectin.  She also states she noted she is bruising more easily. In addition to the above she has noticed stool changes since June.  She states she had a oily appearance to her stools in June x1.  Which she attributed to what she had  consumed that day was [employer].  Then approximately 2-3 weeks ago she had 2 episodes of "black oil "in her stool.  She has had no stool changes since.  She denies any fever, chills, nausea, vomit, dysuria or current stool changes.  She denies any bleeding from her gums.  She denies any rash.  Depression screen [facility] 2/9 09/17/2017  Decreased Interest 0  Down, Depressed, Hopeless 0  PHQ - 2 Score 0   Allergies  Allergen Reactions  . Losartan     Decreased kidney fx   Social History   Social History Narrative   Married to [place]. 2 children, Alice and [place].    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    Drinks caffeine, herbal remedies, daily vitamin use.    Wears her seatbelt, smoke detector in the home, no firearms in the home.    Feels safe in her relationships.    Exercises 2x a week.    Past Medical History:  Diagnosis Date  . Asthma   . Chronic bronchitis ([place]ned)   . Chronic fatigue   . Fibromyalgia   . GERD (gastroesophageal reflux disease)   . Glaucoma   . Hypertension 2019  . IBS (irritable bowel syndrome)   . Osteopenia 09/2016   T score -1.3 FRAX 5.3%/0.3%  . Palpitations    echo and stress test normal 2015 per pt  . Urinary incontinence    Past Surgical History:  Procedure Laterality Date  . CHOLECYSTECTOMY    . TRANSTHORACIC ECHOCARDIOGRAM  11/2013   EF 50-55%, normal LV function, normal wall motion, triavial MR and mild tricuspid regurg.   Family History  Problem Relation Age of Onset  . Hearing loss Mother   . Arthritis Father   . Hearing loss Father   . Stroke Father   . Breast cancer Paternal Grandmother   . Leukemia Maternal Grandmother    Allergies as of 03/15/2019      Reactions   Losartan    Decreased kidney fx      Medication List       Accurate as of March 15, 2019  3:47 PM. If you have any questions, ask your nurse or doctor.        albuterol 108 (90 Base) MCG/ACT inhaler Commonly known as: VENTOLIN HFA Inhale 1-2 puffs into  the lungs every 6 (six) hours as needed for wheezing or shortness of breath.   amLODipine 5 MG tablet Commonly known as: NORVASC Take 1 tablet (5 mg total) by mouth daily.   aspirin EC 81 MG tablet Take 81 mg by mouth 2 (two) times daily.   co-enzyme Q-10 30 MG capsule Take 30 mg by mouth 3 (three) times daily.   estradiol 0.1 MG/GM vaginal cream Commonly known as: ESTRACE Place 1 Applicatorful vaginally at bedtime.   multivitamin with minerals tablet Take 1 tablet by mouth daily.   vitamin B-12 100 MCG tablet Commonly known as: CYANOCOBALAMIN Take 100 mcg by mouth daily.   Vitamin  C 500 MG Caps Take 2 tablets by mouth 2 (two) times daily.       All past medical history, surgical history, allergies, family history, immunizations andmedications were updated in the EMR today and reviewed under the history and medication portions of their EMR.     ROS: Negative, with the exception of above mentioned in HPI   Objective:  BP 110/79 (BP Location: Left Arm, Patient Position: Sitting, Cuff Size: Normal)   Pulse 82   Temp 98.3 F (36.8 C) (Temporal)   Resp 17   Ht 5\' 5"  (1.651 m)   Wt 177 lb 6 oz (80.5 kg)   LMP 04/16/2016 (Approximate)   SpO2 98%   BMI 29.52 kg/m  Body mass index is 29.52 kg/m. Gen: Afebrile. No acute distress.  HENT: AT. [place].  Eyes:Pupils Equal Round Reactive to light, Extraocular movements intact,  Conjunctiva without redness, discharge or icterus. Neuro:  Normal gait. PERLA. EOMi. Alert. Oriented x3  Psych: Normal affect, dress and demeanor. Normal speech. Normal thought content and judgment..   No exam data present No results found. Results for orders placed or performed in visit on 03/15/19 (from the past 24 hour(s))  CBC with Differential ([place] Only)     Status: Abnormal   Collection Time: 03/15/19  1:16 PM  Result Value Ref Range   WBC Count 7.7 4.0 - 10.5 K/uL   RBC 4.79 3.87 - 5.11 MIL/uL   Hemoglobin 15.4 (H) 12.0 - 15.0 g/dL    HCT 46.4 (H) 36.0 - 46.0 %   MCV 96.9 80.0 - 100.0 fL   MCH 32.2 26.0 - 34.0 pg   MCHC 33.2 30.0 - 36.0 g/dL   RDW 12.3 11.5 - 15.5 %   Platelet Count 288 150 - 400 K/uL   nRBC 0.0 0.0 - 0.2 %   Neutrophils Relative % 67 %   Neutro Abs 5.2 1.7 - 7.7 K/uL   Lymphocytes Relative 23 %   Lymphs Abs 1.8 0.7 - 4.0 K/uL   Monocytes Relative 8 %   Monocytes Absolute 0.6 0.1 - 1.0 K/uL   Eosinophils Relative 1 %   Eosinophils Absolute 0.1 0.0 - 0.5 K/uL   Basophils Relative 1 %   Basophils Absolute 0.1 0.0 - 0.1 K/uL   Immature Granulocytes 0 %   Abs Immature Granulocytes 0.02 0.00 - 0.07 K/uL    Assessment/Plan: Phala Veigel is a 58 y.o. female present for OV for  Bowel habit changes/fatty stools/unintentional weight loss/pale stools -Work-up surrounding her bowel habit changes have been  negative.  She continues to have pain > 10 weeks with pale colored stool with black oily substance within stool. Concern for pancreatic, hepatic, or colon   malignancy or pathology.  - asked her to attempt to produce the same scenario, by eating the same foods and then collect stool studies >> they were normal for a second time. - Lipase>>normal 12/2018>>normal 2nd time - Ova and parasite examination>>normal 12/2018 and 2nd collection - Fecal fat, qualitative>>normal 12/2018>and 2nd collection - Stool, WBC/Lactoferrin>>normal 12/2018>>rpt this weekend - Stool Culture>>normal 12/2018>>and 2nd collection - Lactate Dehydrogenase (LDH)>>normal 12/2018 - referral to gastroenterology placed>> she established and they recommended EGD/colonoscopy.  - CT ABD >> essentially normal findings, pneuobilia identified- she has had chole and sphincterectomy in the past>> could be cause>> low yield but could be cause of discomfort of her RUQ also. >>encouraged her to have GI studies completed recommended by GI  Easy bruising/shortness of breath/increased thirst/dry mouth/elevated hemoglobin -Uncertain  etiology. Continue to  Avoid all NSAIDs. - Has established with HEME, no cause found as of yet.  - JAK2 normal/negative - Protime-INR ( [facility] ONLY)>>normal - Lactate Dehydrogenase (LDH)>> normal - Chest x-ray 11/11/2018 normal>>referred to pulmonology 01/2019 and she reports no one has called her to schedule yet. We will check into referral for her.  - ACE level, ANA, alpha antitrypsin, erythropoietin, carboxyhemoglobin and iron panel >>normal.    Reviewed expectations re: course of current medical issues.  Discussed self-management of symptoms.  Outlined signs and symptoms indicating need for more acute intervention.  Patient verbalized understanding and all questions were answered.  Patient received an After-Visit Summary.    No orders of the defined types were placed in this encounter.  > 15 minutes spent with patient, > 50% of that time face to face   No orders of the defined types were placed in this encounter.    Note is dictated utilizing voice recognition software. Although note has been proof read prior to signing, occasional typographical errors still can be missed. If any questions arise, please do not hesitate to call for verification.   electronically signed by:  Howard Pouch, DO  [place]

## 2019-03-15 NOTE — Patient Instructions (Signed)
They will be calling you for the pulmonary/lung doctor to evaluate your shortness and possible cause of your elevation of hemoglobin.    I would recommend you have the colon/egd studies.

## 2019-03-16 ENCOUNTER — Telehealth: Payer: Self-pay | Admitting: Hematology and Oncology

## 2019-03-16 LAB — ERYTHROPOIETIN: Erythropoietin: 6.8 m[IU]/mL (ref 2.6–18.5)

## 2019-03-16 NOTE — Telephone Encounter (Signed)
I talk with patient regarding schedule and she looked on my chart

## 2019-03-22 ENCOUNTER — Other Ambulatory Visit: Payer: Self-pay

## 2019-03-22 ENCOUNTER — Encounter: Payer: Self-pay | Admitting: Pulmonary Disease

## 2019-03-22 ENCOUNTER — Ambulatory Visit: Payer: BC Managed Care – PPO | Admitting: Pulmonary Disease

## 2019-03-22 VITALS — BP 136/90 | HR 65 | Temp 97.2°F | Ht 65.0 in | Wt 178.6 lb

## 2019-03-22 DIAGNOSIS — D751 Secondary polycythemia: Secondary | ICD-10-CM | POA: Diagnosis not present

## 2019-03-22 DIAGNOSIS — R0609 Other forms of dyspnea: Secondary | ICD-10-CM

## 2019-03-22 DIAGNOSIS — R0683 Snoring: Secondary | ICD-10-CM | POA: Diagnosis not present

## 2019-03-22 DIAGNOSIS — R059 Cough, unspecified: Secondary | ICD-10-CM

## 2019-03-22 DIAGNOSIS — H40013 Open angle with borderline findings, low risk, bilateral: Secondary | ICD-10-CM | POA: Diagnosis not present

## 2019-03-22 DIAGNOSIS — R0600 Dyspnea, unspecified: Secondary | ICD-10-CM

## 2019-03-22 DIAGNOSIS — H524 Presbyopia: Secondary | ICD-10-CM | POA: Diagnosis not present

## 2019-03-22 DIAGNOSIS — R05 Cough: Secondary | ICD-10-CM | POA: Diagnosis not present

## 2019-03-22 NOTE — Progress Notes (Signed)
  Subjective:     Patient ID: Rebecca Pollard, female   DOB: 1960-12-06, 58 y.o.   MRN: [id number]  HPI   Review of Systems  Constitutional: Positive for appetite change and fatigue.  HENT: Positive for postnasal drip and voice change.   Eyes: Negative.   Respiratory: Positive for cough and shortness of breath.   Cardiovascular: Positive for chest pain and palpitations.  Gastrointestinal: Negative.   Endocrine: Negative.   Genitourinary: Positive for hematuria.  Musculoskeletal: Positive for back pain.  Skin: Negative.   Allergic/Immunologic: Positive for environmental allergies.  Neurological: Positive for dizziness.  Hematological: Bruises/bleeds easily.  Psychiatric/Behavioral: Negative.        Objective:   Physical Exam     Assessment:         Plan:

## 2019-03-22 NOTE — Progress Notes (Signed)
[facility]  Chief Complaint  Patient presents with  . Consult    Shortness of breath, chronic cough    Constitutional:  BP 136/90 (BP Location: Right Arm, Patient Position: Sitting, Cuff Size: Normal)   Pulse 65   Temp (!) 97.2 F (36.2 C)   Ht 5\' 5"  (1.651 m)   Wt 178 lb 9.6 oz (81 kg)   LMP 04/16/2016 (Approximate)   SpO2 98% Comment: on room air  BMI 29.72 kg/m   Past Medical History:  Renal cyst, Osteopenia, IBS, HTN, Glaucoma, GERD, Fibromyalgia, Chronic fatigue  Brief Summary:  Rebecca Pollard is a 58 y.o. female with cough and dyspnea.  She has history of allergies.  Previous allergy testing showed reaction to grasses, mold and dust mites.  She is from [place] but has lived in [place] for 12 years.  She used to do office work.  She was on trip to [place] in February 2020.  She developed a respiratory infection.  Her symptoms lasted for months.  She was treated with prednisone.  She would get episodes of asthmatic bronchitis when she developed a respiratory infection, and use of albuterol during these episodes helped.  She was never told that she has asthma.  Not using inhaler on regular basis at present.  She still has intermittent cough and post nasal drip.  Not having wheeze.  Brings up clear sputum occasionally.  Doesn't feel like her breathing limits her activity.  She had lab work done and showed elevated hemoglobin.  Has been seen by Dr. 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  She does snore.  She does get tired during the day.  No history of pneumonia.  She never smoked cigarettes, but her grandparents did.  Her father worked around asbestos, and he had trouble with his breathing for years.  She has a Programmer, systems.  No history of TB.   CXR from 11/11/18 (reviewed by me) was normal.   Physical Exam:   Appearance - well kempt   ENMT - clear nasal mucosa, midline nasal  septum, no oral exudates, no LAN,  trachea midline, MP 2, 2+ tonsils, elongated uvula  Respiratory - normal chest wall, normal respiratory effort, no accessory muscle use, no wheeze/rales  CV - s1s2 regular rate and rhythm, no murmurs, no peripheral edema, radial pulses symmetric  GI - soft, non tender, no masses  Lymph - no adenopathy noted in neck and axillary areas  MSK - normal gait  Ext - no cyanosis, clubbing, or joint inflammation noted  Skin - no rashes, lesions, or ulcers  Neuro - normal strength, oriented x 3  Psych - normal mood and affect  Discussion:  She has secondary polycythemia.  She has snoring and daytime sleepiness.  As such she could have sleep apnea.  She also has allergies and intermittent cough which is suggestive of asthma.  Assessment/Plan:   Snoring with daytime sleepiness. - will arrange for home sleep study to further assess for obstructive sleep apnea  Cough with hx of seasonal allergies. - will arrange for PFT to further assess - continue prn albuterol for now   Patient Instructions  Will schedule pulmonary function test   Will arrange for home sleep study  Will call to arrange for follow up after sleep study reviewed     Rebecca Mires, MD [place] Pager: [phone] 03/22/2019, 10:43 AM  Flow Sheet     Pulmonary tests:  A1AT 01/31/19 >>  141  Serology:  01/31/19 >> ANA positive 1:80 nuclear/speckled pattern; ds DNA, SCL 70, SSA/SSB, SM negative; ACE 32  Sleep tests:     Review of Systems:  Constitutional: Positive for appetite change and fatigue.  HENT: Positive for postnasal drip and voice change.   Eyes: Negative.   Respiratory: Positive for cough and shortness of breath.   Cardiovascular: Positive for chest pain and palpitations.  Gastrointestinal: Negative.   Endocrine: Negative.   Genitourinary: Positive for hematuria.  Musculoskeletal: Positive for back pain.  Skin: Negative.   Allergic/Immunologic: Positive for  environmental allergies.  Neurological: Positive for dizziness.  Hematological: Bruises/bleeds easily.  Psychiatric/Behavioral: Negative.    Medications:   Allergies as of 03/22/2019      Reactions   Losartan    Decreased kidney fx      Medication List       Accurate as of March 22, 2019 10:43 AM. If you have any questions, ask your nurse or doctor.        albuterol 108 (90 Base) MCG/ACT inhaler Commonly known as: VENTOLIN HFA Inhale 1-2 puffs into the lungs every 6 (six) hours as needed for wheezing or shortness of breath.   amLODipine 5 MG tablet Commonly known as: NORVASC Take 1 tablet (5 mg total) by mouth daily.   aspirin EC 81 MG tablet Take 81 mg by mouth 2 (two) times daily.   co-enzyme Q-10 30 MG capsule Take 30 mg by mouth 3 (three) times daily.   estradiol 0.1 MG/GM vaginal cream Commonly known as: ESTRACE Place 1 Applicatorful vaginally at bedtime.   multivitamin with minerals tablet Take 1 tablet by mouth daily.   vitamin B-12 100 MCG tablet Commonly known as: CYANOCOBALAMIN Take 100 mcg by mouth daily.   Vitamin C 500 MG Caps Take 2 tablets by mouth 2 (two) times daily.       Past Surgical History:  She  has a past surgical history that includes Cholecystectomy and transthoracic echocardiogram (11/2013).  Family History:  Her family history includes Arthritis in her father; Breast cancer in her paternal grandmother; Hearing loss in her father and mother; Leukemia in her maternal grandmother; Stroke in her father.  Social History:  She  reports that she has never smoked. She has never used smokeless tobacco. She reports previous alcohol use. She reports that she does not use drugs.

## 2019-03-22 NOTE — Patient Instructions (Signed)
Will schedule pulmonary function test Will arrange for home sleep study Will call to arrange for follow up after sleep study reviewed  

## 2019-03-23 ENCOUNTER — Encounter: Payer: Self-pay | Admitting: Gastroenterology

## 2019-04-03 ENCOUNTER — Other Ambulatory Visit: Payer: Self-pay

## 2019-04-03 ENCOUNTER — Ambulatory Visit (AMBULATORY_SURGERY_CENTER): Payer: BC Managed Care – PPO | Admitting: *Deleted

## 2019-04-03 VITALS — Temp 97.7°F | Ht 65.0 in | Wt 180.0 lb

## 2019-04-03 DIAGNOSIS — R109 Unspecified abdominal pain: Secondary | ICD-10-CM

## 2019-04-03 DIAGNOSIS — K921 Melena: Secondary | ICD-10-CM

## 2019-04-03 DIAGNOSIS — R194 Change in bowel habit: Secondary | ICD-10-CM

## 2019-04-03 DIAGNOSIS — Z1159 Encounter for screening for other viral diseases: Secondary | ICD-10-CM

## 2019-04-03 NOTE — Progress Notes (Signed)
Patient is here in-person for PV. Patient denies any allergies to eggs or soy. Patient denies any problems with anesthesia/sedation. Patient denies any oxygen use at home. Patient denies taking any diet/weight loss medications or blood thinners. Patient is not being treated for MRSA or C-diff. EMMI education assisgned to the patient for the procedure, this was explained and instructions given to patient. COVID-19 screening test is on 11/19, the pt is aware. Pt is aware that care partner will wait in the car during procedure; if they feel like they will be too hot or cold to wait in the car; they may wait in the 4 th floor lobby. Patient is aware to bring only one care partner. We want them to wear a mask (we do not have any that we can provide them), practice social distancing, and we will check their temperatures when they get here.  I did remind the patient that their care partner needs to stay in the parking lot the entire time and have a cell phone available, we will call them when the pt is ready for discharge. Patient will wear mask into building.

## 2019-04-04 ENCOUNTER — Encounter: Payer: Self-pay | Admitting: Gastroenterology

## 2019-04-12 ENCOUNTER — Ambulatory Visit
Admission: RE | Admit: 2019-04-12 | Discharge: 2019-04-12 | Disposition: A | Discharge status: Home / Self Care | Payer: BC Managed Care – PPO | Source: Ambulatory Visit | Attending: Obstetrics & Gynecology | Admitting: Obstetrics & Gynecology

## 2019-04-12 ENCOUNTER — Telehealth: Payer: Self-pay | Admitting: Pulmonary Disease

## 2019-04-12 ENCOUNTER — Other Ambulatory Visit: Payer: Self-pay

## 2019-04-12 DIAGNOSIS — Z1231 Encounter for screening mammogram for malignant neoplasm of breast: Secondary | ICD-10-CM | POA: Diagnosis not present

## 2019-04-12 NOTE — Telephone Encounter (Signed)
I had called pt to schedule sleep study & left her a vm.  I'll call her.

## 2019-04-12 NOTE — Telephone Encounter (Signed)
Received call from patient. She stated that she was returning a call from our office. I reviewed her chart and did not see where anyone from our office had called her. She stated that she was seen by RA back in October 2020 and he had ordered multiple tests for her. She wondered if someone was reaching out to these scheduled. Advised her that I would ask our pccs.   PCCs, please advise. Thanks!

## 2019-04-12 NOTE — Telephone Encounter (Signed)
Called pt back and had to leave another vm.  Looks like VS wanted her to have pft as well.  Told her in vm we can set that up also.

## 2019-04-13 ENCOUNTER — Ambulatory Visit (INDEPENDENT_AMBULATORY_CARE_PROVIDER_SITE_OTHER): Payer: BC Managed Care – PPO

## 2019-04-13 ENCOUNTER — Other Ambulatory Visit: Payer: Self-pay | Admitting: Gastroenterology

## 2019-04-13 DIAGNOSIS — Z1159 Encounter for screening for other viral diseases: Secondary | ICD-10-CM

## 2019-04-14 LAB — SARS CORONAVIRUS 2 (TAT 6-24 HRS): SARS Coronavirus 2: NEGATIVE

## 2019-04-18 ENCOUNTER — Ambulatory Visit (AMBULATORY_SURGERY_CENTER): Payer: BC Managed Care – PPO | Admitting: Gastroenterology

## 2019-04-18 ENCOUNTER — Encounter: Payer: Self-pay | Admitting: Gastroenterology

## 2019-04-18 ENCOUNTER — Other Ambulatory Visit: Payer: Self-pay

## 2019-04-18 VITALS — BP 115/85 | HR 75 | Temp 97.7°F | Resp 15 | Ht 65.0 in | Wt 180.0 lb

## 2019-04-18 DIAGNOSIS — K259 Gastric ulcer, unspecified as acute or chronic, without hemorrhage or perforation: Secondary | ICD-10-CM | POA: Diagnosis not present

## 2019-04-18 DIAGNOSIS — K2970 Gastritis, unspecified, without bleeding: Secondary | ICD-10-CM

## 2019-04-18 DIAGNOSIS — K641 Second degree hemorrhoids: Secondary | ICD-10-CM

## 2019-04-18 DIAGNOSIS — D128 Benign neoplasm of rectum: Secondary | ICD-10-CM

## 2019-04-18 DIAGNOSIS — K2100 Gastro-esophageal reflux disease with esophagitis, without bleeding: Secondary | ICD-10-CM | POA: Diagnosis not present

## 2019-04-18 DIAGNOSIS — R194 Change in bowel habit: Secondary | ICD-10-CM

## 2019-04-18 DIAGNOSIS — K5730 Diverticulosis of large intestine without perforation or abscess without bleeding: Secondary | ICD-10-CM | POA: Diagnosis not present

## 2019-04-18 DIAGNOSIS — K2990 Gastroduodenitis, unspecified, without bleeding: Secondary | ICD-10-CM

## 2019-04-18 DIAGNOSIS — K3189 Other diseases of stomach and duodenum: Secondary | ICD-10-CM | POA: Diagnosis not present

## 2019-04-18 DIAGNOSIS — D122 Benign neoplasm of ascending colon: Secondary | ICD-10-CM | POA: Diagnosis not present

## 2019-04-18 DIAGNOSIS — K2950 Unspecified chronic gastritis without bleeding: Secondary | ICD-10-CM | POA: Diagnosis not present

## 2019-04-18 DIAGNOSIS — K921 Melena: Secondary | ICD-10-CM

## 2019-04-18 DIAGNOSIS — R109 Unspecified abdominal pain: Secondary | ICD-10-CM

## 2019-04-18 MED ORDER — SODIUM CHLORIDE 0.9 % IV SOLN: 500.0000 mL | Freq: Once | INTRAVENOUS | Status: DC

## 2019-04-18 MED ORDER — OMEPRAZOLE 40 MG PO CPDR: 40.0000 mg | DELAYED_RELEASE_CAPSULE | Freq: Two times a day (BID) | ORAL | 0 refills | Status: DC

## 2019-04-18 NOTE — Op Note (Signed)
[facility] Patient Name: Rebecca Pollard Procedure Date: 04/18/2019 8:35 AM MRN: [id number] Endoscopist: Justice Britain , MD Age: 58 Referring MD:  Date of Birth: 01-03-1961 Gender: Female Account #: [id number] Procedure:                Colonoscopy Indications:              Screening for colorectal malignant neoplasm,                            history of IBS with diarrhea Medicines:                Monitored Anesthesia Care Procedure:                Pre-Anesthesia Assessment:                           - Prior to the procedure, a History and Physical                            was performed, and patient medications and                            allergies were reviewed. The patient's tolerance of                            previous anesthesia was also reviewed. The risks                            and benefits of the procedure and the sedation                            options and risks were discussed with the patient.                            All questions were answered, and informed consent                            was obtained. Prior Anticoagulants: The patient has                            taken no previous anticoagulant or antiplatelet                            agents except for aspirin. ASA Grade Assessment: II                            - A patient with mild systemic disease. After                            reviewing the risks and benefits, the patient was                            deemed in satisfactory condition to undergo the  procedure.                           After obtaining informed consent, the colonoscope                            was passed under direct vision. Throughout the                            procedure, the patient's blood pressure, pulse, and                            oxygen saturations were monitored continuously. The                            Colonoscope was introduced through the anus and                 advanced to the 5 cm into the ileum. The                            colonoscopy was performed without difficulty. The                            patient tolerated the procedure. The quality of the                            bowel preparation was good. The terminal ileum,                            ileocecal valve, appendiceal orifice, and rectum                            were photographed. Scope In: 8:54:47 AM Scope Out: 9:18:18 AM Scope Withdrawal Time: 0 hours 18 minutes 14 seconds  Total Procedure Duration: 0 hours 23 minutes 31 seconds  Findings:                 The digital rectal exam findings include                            hemorrhoids. Pertinent negatives include no                            palpable rectal lesions.                           The terminal ileum and ileocecal valve appeared                            normal.                           Two sessile and semi-pedunculated polyps were found                            in the rectum and ascending colon. The polyps were  2 to 8 mm in size. These polyps were removed with a                            cold snare. Resection and retrieval were complete.                            The polyp in the rectum continued to ooze so                            decision was made to prevent further oozing after                            the polypectomy, one hemostatic clip was                            successfully placed (MR conditional). There was no                            bleeding at the end of the procedure.                           Normal mucosa was found in the entire colon                            otherwise. Biopsies for histology were taken with a                            cold forceps from the entire colon for evaluation                            of microscopic colitis.                           A few small-mouthed diverticula were found in the                             sigmoid colon and descending colon.                           Non-bleeding non-thrombosed internal hemorrhoids                            were found during retroflexion, during perianal                            exam and during digital exam. The hemorrhoids were                            Grade II (internal hemorrhoids that prolapse but                            reduce spontaneously). Complications:            No immediate complications. Estimated Blood Loss:  Estimated blood loss was minimal. Impression:               - Hemorrhoids found on digital rectal exam.                           - The examined portion of the ileum was normal.                           - Two 2 to 8 mm polyps in the rectum and in the                            ascending colon, removed with a cold snare.                            Resected and retrieved. Clip (MR conditional) was                            placed.                           - Normal mucosa in the entire examined colon.                            Biopsied.                           - Diverticulosis in the sigmoid colon and in the                            descending colon.                           - Non-bleeding non-thrombosed internal hemorrhoids. Recommendation:           - The patient will be observed post-procedure,                            until all discharge criteria are met.                           - Discharge patient to home.                           - Patient has a contact number available for                            emergencies. The signs and symptoms of potential                            delayed complications were discussed with the                            patient. Return to normal activities tomorrow.                            Written discharge instructions  were provided to the                            patient.                           - High fiber diet.                           - Use FiberCon 1 tablet PO daily.                            - Continue present medications.                           - Await pathology results.                           - Repeat colonoscopy 5/7 years for surveillance                            based on pathology results and findings of                            adenomatous tissue.                           - The findings and recommendations were discussed                            with the patient. Justice Britain, MD 04/18/2019 9:32:20 AM

## 2019-04-18 NOTE — Patient Instructions (Signed)
Information on polyps, diverticulosis and hemorrhoids given to you today.  Await pathology results.  Start Omeprazole 40mg  twice a day for the next 3 months.  Repeat upper endoscopy in 2-3 months to check healing.  Eat a high fiber diet.  Use FiberCon tablets once a day  YOU HAD AN ENDOSCOPIC PROCEDURE TODAY AT [facility]:   Refer to the procedure report that was given to you for any specific questions about what was found during the examination.  If the procedure report does not answer your questions, please call your gastroenterologist to clarify.  If you requested that your care partner not be given the details of your procedure findings, then the procedure report has been included in a sealed envelope for you to review at your convenience later.  YOU SHOULD EXPECT: Some feelings of bloating in the abdomen. Passage of more gas than usual.  Walking can help get rid of the air that was put into your GI tract during the procedure and reduce the bloating. If you had a lower endoscopy (such as a colonoscopy or flexible sigmoidoscopy) you may notice spotting of blood in your stool or on the toilet paper. If you underwent a bowel prep for your procedure, you may not have a normal bowel movement for a few days.  Please Note:  You might notice some irritation and congestion in your nose or some drainage.  This is from the oxygen used during your procedure.  There is no need for concern and it should clear up in a day or so.  SYMPTOMS TO REPORT IMMEDIATELY:   Following lower endoscopy (colonoscopy or flexible sigmoidoscopy):  Excessive amounts of blood in the stool  Significant tenderness or worsening of abdominal pains  Swelling of the abdomen that is new, acute  Fever of 100F or higher   Following upper endoscopy (EGD)  Vomiting of blood or coffee ground material  New chest pain or pain under the shoulder blades  Painful or persistently difficult swallowing  New  shortness of breath  Fever of 100F or higher  Black, tarry-looking stools  For urgent or emergent issues, a gastroenterologist can be reached at any hour by calling [phone].   DIET:  We do recommend a small meal at first, but then you may proceed to your regular diet.  Drink plenty of fluids but you should avoid alcoholic beverages for 24 hours.  ACTIVITY:  You should plan to take it easy for the rest of today and you should NOT DRIVE or use heavy machinery until tomorrow (because of the sedation medicines used during the test).    FOLLOW UP: Our staff will call the number listed on your records 48-72 hours following your procedure to check on you and address any questions or concerns that you may have regarding the information given to you following your procedure. If we do not reach you, we will leave a message.  We will attempt to reach you two times.  During this call, we will ask if you have developed any symptoms of COVID 19. If you develop any symptoms (ie: fever, flu-like symptoms, shortness of breath, cough etc.) before then, please call [phone].  If you test positive for Covid 19 in the 2 weeks post procedure, please call and report this information to [place].    If any biopsies were taken you will be contacted by phone or by letter within the next 1-3 weeks.  Please call us at [phone] if you have not  heard about the biopsies in 3 weeks.    SIGNATURES/CONFIDENTIALITY: You and/or your care partner have signed paperwork which will be entered into your electronic medical record.  These signatures attest to the fact that that the information above on your After Visit Summary has been reviewed and is understood.  Full responsibility of the confidentiality of this discharge information lies with you and/or your care-partner.

## 2019-04-18 NOTE — Progress Notes (Signed)
Called to room to assist during endoscopic procedure.  Patient ID and intended procedure confirmed with present staff. Received instructions for my participation in the procedure from the performing physician.  

## 2019-04-18 NOTE — Progress Notes (Signed)
Pt. Reports no change in her medical or surgical history since her pre-visit 04/03/2019.

## 2019-04-18 NOTE — Op Note (Signed)
[facility] Patient Name: Rebecca Pollard Procedure Date: 04/18/2019 8:36 AM MRN: [id number] Endoscopist: Justice Britain , MD Age: 58 Referring MD:  Date of Birth: 1961-04-02 Gender: Female Account #: [id number] Procedure:                Upper GI endoscopy Indications:              Abdominal pain in the right upper quadrant, Diarrhea Medicines:                Monitored Anesthesia Care Procedure:                Pre-Anesthesia Assessment:                           - Prior to the procedure, a History and Physical                            was performed, and patient medications and                            allergies were reviewed. The patient's tolerance of                            previous anesthesia was also reviewed. The risks                            and benefits of the procedure and the sedation                            options and risks were discussed with the patient.                            All questions were answered, and informed consent                            was obtained. Prior Anticoagulants: The patient has                            taken no previous anticoagulant or antiplatelet                            agents except for aspirin. ASA Grade Assessment: II                            - A patient with mild systemic disease. After                            reviewing the risks and benefits, the patient was                            deemed in satisfactory condition to undergo the                            procedure.  After obtaining informed consent, the endoscope was                            passed under direct vision. Throughout the                            procedure, the patient's blood pressure, pulse, and                            oxygen saturations were monitored continuously. The                            Endoscope was introduced through the mouth, and                            advanced to the second part  of duodenum. The upper                            GI endoscopy was accomplished without difficulty.                            The patient tolerated the procedure. Scope In: Scope Out: Findings:                 No gross lesions were noted in the proximal                            esophagus and in the mid esophagus.                           LA Grade A (one or more mucosal breaks less than 5                            mm, not extending between tops of 2 mucosal folds)                            esophagitis with no bleeding was found at the                            gastroesophageal junction.                           The Z-line was regular and was found 39 cm from the                            incisors.                           Patchy moderately erythematous mucosa without                            bleeding was found in the entire examined stomach.                           One  non-bleeding superficial gastric ulcer with a                            clean ulcer base (Forrest Class III) was found in                            the gastric antrum. The lesion was 6 mm in largest                            dimension.                           Patchy moderate inflammation characterized by                            erosions, erythema and granularity was found in the                            gastric body, at the incisura and in the gastric                            antrum.                           No other gross lesions were noted in the entire                            examined stomach. Biopsies were taken with a cold                            forceps for histology and Helicobacter pylori                            testing.                           No gross lesions were noted in the duodenal bulb,                            in the first portion of the duodenum and in the                            second portion of the duodenum. Biopsies for                            histology  were taken with a cold forceps for                            evaluation of celiac disease. Unable to visualize                            ampulla fully without duodenoscope. Complications:            No immediate complications. Estimated Blood Loss:     Estimated blood loss  was minimal. Impression:               - No gross lesions in esophagus proximally. LA                            Grade A esophagitis with no bleeding. Z-line                            regular, 39 cm from the incisors.                           - Erythematous mucosa in the stomach. Non-bleeding                            gastric ulcer with a clean ulcer base (Forrest                            Class III). Gastritis. Biopsied.                           - No gross lesions in the duodenal bulb, in the                            first portion of the duodenum and in the second                            portion of the duodenum. Biopsied. Unable to                            visualize ampulla without a duodenoscope. Recommendation:           - Proceed to scheduled colonoscopy.                           - Follow up pathology.                           - Start Omeprazole 40 mg BID for next 85-months.                           - Repeat upper endoscopy in 2-3 months to check                            healing. Will decide on decreasing PPI thereafter                            based on healing.                           - The findings and recommendations were discussed                            with the patient. Justice Britain, MD 04/18/2019 9:26:59 AM

## 2019-04-18 NOTE — Progress Notes (Signed)
Report to PACU, RN, vss, BBS= Clear.  

## 2019-04-24 ENCOUNTER — Telehealth: Payer: Self-pay | Admitting: *Deleted

## 2019-04-24 ENCOUNTER — Telehealth: Payer: Self-pay

## 2019-04-24 DIAGNOSIS — H40053 Ocular hypertension, bilateral: Secondary | ICD-10-CM | POA: Diagnosis not present

## 2019-04-24 DIAGNOSIS — H2513 Age-related nuclear cataract, bilateral: Secondary | ICD-10-CM | POA: Diagnosis not present

## 2019-04-24 NOTE — Telephone Encounter (Signed)
NO ANSWER, MESSAGE LEFT FOR PATIENT. 

## 2019-04-24 NOTE — Telephone Encounter (Signed)
Left message on 2nd follow up call. 

## 2019-04-24 NOTE — Telephone Encounter (Signed)
  Follow up Call-  Call back number 04/18/2019  Post procedure Call Back phone  # [phone]  Permission to leave phone message Yes  Some recent data might be hidden     Patient questions:  Message left to call us if necessary.

## 2019-04-25 ENCOUNTER — Ambulatory Visit (INDEPENDENT_AMBULATORY_CARE_PROVIDER_SITE_OTHER): Payer: BC Managed Care – PPO | Admitting: Family Medicine

## 2019-04-25 ENCOUNTER — Other Ambulatory Visit: Payer: Self-pay

## 2019-04-25 ENCOUNTER — Encounter: Payer: Self-pay | Admitting: Family Medicine

## 2019-04-25 VITALS — BP 115/83 | HR 76 | Ht 65.0 in | Wt 175.0 lb

## 2019-04-25 DIAGNOSIS — D751 Secondary polycythemia: Secondary | ICD-10-CM | POA: Diagnosis not present

## 2019-04-25 DIAGNOSIS — R109 Unspecified abdominal pain: Secondary | ICD-10-CM | POA: Diagnosis not present

## 2019-04-25 DIAGNOSIS — D582 Other hemoglobinopathies: Secondary | ICD-10-CM

## 2019-04-25 NOTE — Progress Notes (Signed)
VIRTUAL VISIT VIA VIDEO  I connected with Rebecca Pollard on 04/25/2019 at 10:30 AM EST by a video enabled telemedicine application and verified that I am speaking with the correct person using two identifiers. Location patient: Home Location provider: [facility], Office Persons participating in the virtual visit: Patient, Dr. Raoul Pitch and R.Baker, LPN  I discussed the limitations of evaluation and management by telemedicine and the availability of in person appointments. The patient expressed understanding and agreed to proceed.     Rebecca Pollard , 1960-08-11, 58 y.o., female MRN: [id number] Patient Care Team    Relationship Specialty Notifications Start End  Ma Hillock, DO PCP - General Family Medicine  05/31/15   Michel Bickers, MD Consulting Physician Infectious Diseases  01/23/19   Nicholas Lose, MD Consulting Physician Hematology and Oncology  04/27/19   Mansouraty, Telford Nab., MD Consulting Physician Gastroenterology  04/27/19   Chesley Mires, MD Consulting Physician Pulmonary Disease  04/27/19     Chief Complaint  Patient presents with  . side effects    Pt is wanting to know Prilosec is safe or not because of her Kidney function. Pt is also wanting to know if she should continue to donate blood, this made her feel better      Subjective:  Rebecca Pollard is a 58 y.o. female present today to discuss her Prilosec.  She is worried to start the medication because the information on the medication stated that it could cause kidney issues.  Patient reports she also donated blood 04/16/2019 to help with her elevated hemoglobin/hematocrit.  She is wondering how often she should give blood to keep her levels in normal range.  She also states she started taking a baby aspirin. She reports she has not heard back from pulmonology, however sleep study has been delayed secondary to pandemic.  Prior ntoe:  discuss her CT results.  She has now established with GI and HEME. She was  called with all results, and established with GI and HEME after, however she has some questions she would like discuss today in more detail and obtain this providers opinion on recommendations. She also reports she has not heard from the pulm office to schedule as of yet- referral placed 02/06/2019.  Prior note:   recurrence of black oily stool.  Patient reports she had a recurrence of the oily black substance in her stool.  She has ongoing diffuse abdominal discomfort now greater than 10 weeks, with increased right abdomen/flank discomfort that she states now is also radiating around her back and having radiation to her right shoulder.  She has a history of cholecystectomy.  She also endorses pale-colored stools that are soft but formed.  She reports after eating homemade chicken noodle soup she again had a recurrence of the black oily substance in her stool.  She brings a picture with her today-with normal consistency, although mildly pale stools with multiple size drops, largest about the size of a quarter of floating, clinging to the sides of the bowl black oily drops.  She is still endorsing the hot flashes, sweating and flushing.  She has appointment with hematology next week.  She has had unintentional weight loss over the last month. Prior note: - her [place] of her kidneys was normal with the exception of a small cyst of her right kidney.  This has been chronic and stable.  - mildly lower than normal kidney function and elevated hemoglobin-both are which are new for her and started with  her symptoms onset July 3-losartan was also started at that time and replaced lisinopril (which was stopped secondary to chronic cough). Patient has had multiple complaints and symptoms over the the last 2-3 months of easy bruising, shortness of breath which she states feels like she has COPD, right flank pain, burning chest and metallic taste, chronic cough, increased thirst, dry mouth and most recently GI complaints of  loose/tarry/oily stools.  She reports her flank pain actually feels "better with hydration.  "She states she has been pushing the water and is drinking greater than 80 ounces a day.  She reports no more issues as far as passing worms in her urine since the use of ivermectin and doxycycline.  Prior note:  Pt presents for an OV with complaints of right flank pain and bruising after completing an ivermectin course prescribed by infectious disease for possible genitourinary myiasis.  Patient has been seeing infectious disease since noting flies and warmth in her urine since November 25, 2018.  There has been some concern for genitourinary myiasis given her symptoms, however this has not been able to be proven diagnostically as of yet.  Her urine ova and parasite were negative.  There has been flies and worms visualized in her urine.  She was supplied with an ivermectin and doxycycline course in hopes to treat her  condition.  She states she did feel better and has not seen any evidence of parasites since the course of ivermectin/doxycycline.  However she started to experience right flank pain at the end of the course of ivermectin.  She also states she noted she is bruising more easily. In addition to the above she has noticed stool changes since June.  She states she had a oily appearance to her stools in June x1.  Which she attributed to what she had consumed that day was [employer].  Then approximately 2-3 weeks ago she had 2 episodes of "black oil "in her stool.  She has had no stool changes since.  She denies any fever, chills, nausea, vomit, dysuria or current stool changes.  She denies any bleeding from her gums.  She denies any rash.  Depression screen [facility] 2/9 09/17/2017  Decreased Interest 0  Down, Depressed, Hopeless 0  PHQ - 2 Score 0   Allergies  Allergen Reactions  . Losartan     Decreased kidney fx   Social History   Social History Narrative   Married to [place]. 2 children, Rebecca Pollard and Rebecca Pollard.     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    Drinks caffeine, herbal remedies, daily vitamin use.    Wears her seatbelt, smoke detector in the home, no firearms in the home.    Feels safe in her relationships.    Exercises 2x a week.    Past Medical History:  Diagnosis Date  . Asthma   . Chronic bronchitis ([place])   . Chronic fatigue   . Fibromyalgia   . GERD (gastroesophageal reflux disease)   . Glaucoma   . Hypertension 2019  . IBS (irritable bowel syndrome)   . Osteopenia 09/2016   T score -1.3 FRAX 5.3%/0.3%  . Palpitations    echo and stress test normal 2015 per pt  . Secondary polycythemia 2020  . Urinary incontinence    Past Surgical History:  Procedure Laterality Date  . CHOLECYSTECTOMY  1989  . ERCP    . TRANSTHORACIC ECHOCARDIOGRAM  11/2013   EF 50-55%, normal LV function, normal wall motion, triavial MR and mild tricuspid regurg.  Family History  Problem Relation Age of Onset  . Hearing loss Mother   . Arthritis Father   . Hearing loss Father   . Stroke Father   . Breast cancer Paternal Grandmother   . Leukemia Maternal Grandmother   . Colon cancer Neg Hx   . Esophageal cancer Neg Hx   . Rectal cancer Neg Hx   . Stomach cancer Neg Hx   . Colon polyps Neg Hx    Allergies as of 04/25/2019      Reactions   Losartan    Decreased kidney fx      Medication List       Accurate as of April 25, 2019 11:59 PM. If you have any questions, ask your nurse or doctor.        albuterol 108 (90 Base) MCG/ACT inhaler Commonly known as: VENTOLIN HFA Inhale 1-2 puffs into the lungs every 6 (six) hours as needed for wheezing or shortness of breath.   amLODipine 5 MG tablet Commonly known as: NORVASC Take 1 tablet (5 mg total) by mouth daily.   aspirin EC 81 MG tablet Take 81 mg by mouth 2 (two) times daily.   co-enzyme Q-10 30 MG capsule Take 30 mg by mouth 3 (three) times daily.   DHEA PO Take 6 mg by mouth daily.   estradiol 0.1 MG/GM vaginal cream  Commonly known as: ESTRACE Place 1 Applicatorful vaginally at bedtime.   multivitamin with minerals tablet Take 1 tablet by mouth daily.   omeprazole 40 MG capsule Commonly known as: PRILOSEC Take 1 capsule (40 mg total) by mouth 2 (two) times daily.   vitamin B-12 100 MCG tablet Commonly known as: CYANOCOBALAMIN Take 100 mcg by mouth daily.   Vitamin C 500 MG Caps Take 2 tablets by mouth 2 (two) times daily.   VITAMIN D PO Take by mouth.       All past medical history, surgical history, allergies, family history, immunizations andmedications were updated in the EMR today and reviewed under the history and medication portions of their EMR.     ROS: Negative, with the exception of above mentioned in HPI   Objective:  BP 115/83   Pulse 76   Ht 5\' 5"  (1.651 m)   Wt 175 lb (79.4 kg)   LMP 04/16/2016 (Approximate)   BMI 29.12 kg/m  Body mass index is 29.12 kg/m. Gen: Afebrile. No acute distress.  HENT: AT. [place].  Eyes:Pupils Equal Round Reactive to light, Extraocular movements intact,  Conjunctiva without redness, discharge or icterus. Neuro:  Normal gait. PERLA. EOMi. Alert. Oriented. Psych: Normal affect, dress and demeanor. Normal speech. Normal thought content and judgment..    No exam data present No results found. No results found for this or any previous visit (from the past 24 hour(s)).  Assessment/Plan: Aishani Petrasek is a 58 y.o. female present for OV for  Bowel changes/abdominal discomfort -Work-up surrounding her bowel habit changes have been  negative.  She continues to have pain.  She is established with gastroenterology and omeprazole has been prescribed by 13.  I reassured her taking this medication will be safe for her kidney function. - asked her to attempt to produce the same scenario, by eating the same foods and then collect stool studies >> they were normal for a second time. - Lipase>>normal 12/2018>>normal 2nd time - Ova and parasite  examination>>normal 12/2018 and 2nd collection - Fecal fat, qualitative>>normal 12/2018>and 2nd collection - Stool, WBC/Lactoferrin>>normal 12/2018>>rpt this weekend - Stool Culture>>normal 12/2018>>and  2nd collection - Lactate Dehydrogenase (LDH)>>normal 12/2018 - referral to gastroenterology placed>> she established and they recommended EGD/colonoscopy.  - CT ABD >> essentially normal findings, pneuobilia identified- she has had chole and sphincterectomy in the past>> could be cause>> low yield but could be cause of discomfort of her RUQ also. >>encouraged her to have GI studies completed recommended by GI  elevated hemoglobin/erythrocytosis -Uncertain etiology.  She has donated blood 2 weeks ago.  Will collect CBC to see how effective it was not dropping her hemoglobin/hematocrit.  Depending upon those results we will guide her in future blood donations. - Has established with HEME, no cause found as of yet.  - JAK2 normal/negative - Protime-INR ( [facility] ONLY)>>normal - Lactate Dehydrogenase (LDH)>> normal - Chest x-ray 11/11/2018 normal>>referred to pulmonology 01/2019.  She reports she has not heard back from pulmonology and her testing is delayed secondary to pandemic. - ACE level, ANA, alpha antitrypsin, erythropoietin, carboxyhemoglobin and iron panel >>normal.    Reviewed expectations re: course of current medical issues.  Discussed self-management of symptoms.  Outlined signs and symptoms indicating need for more acute intervention.  Patient verbalized understanding and all questions were answered.  Patient received an After-Visit Summary.   > 25 minutes spent with patient, >50% of time spent face to face     Orders Placed This Encounter  Procedures  . CBC   > 15 minutes spent with patient, > 50% of that time face to face   Orders Placed This Encounter  Procedures  . CBC     Note is dictated utilizing voice recognition software. Although note has been proof read prior  to signing, occasional typographical errors still can be missed. If any questions arise, please do not hesitate to call for verification.   electronically signed by:  Howard Pouch, DO  [place]

## 2019-04-26 ENCOUNTER — Ambulatory Visit (INDEPENDENT_AMBULATORY_CARE_PROVIDER_SITE_OTHER): Payer: BC Managed Care – PPO | Admitting: Family Medicine

## 2019-04-26 DIAGNOSIS — D582 Other hemoglobinopathies: Secondary | ICD-10-CM | POA: Diagnosis not present

## 2019-04-27 ENCOUNTER — Telehealth: Payer: Self-pay | Admitting: Family Medicine

## 2019-04-27 DIAGNOSIS — D751 Secondary polycythemia: Secondary | ICD-10-CM

## 2019-04-27 LAB — CBC
HCT: 37.9 % (ref 35.0–45.0)
Hemoglobin: 12.8 g/dL (ref 11.7–15.5)
MCH: 32.4 pg (ref 27.0–33.0)
MCHC: 33.8 g/dL (ref 32.0–36.0)
MCV: 95.9 fL (ref 80.0–100.0)
MPV: 11.3 fL (ref 7.5–12.5)
Platelets: 354 10*3/uL (ref 140–400)
RBC: 3.95 10*6/uL (ref 3.80–5.10)
RDW: 11.8 % (ref 11.0–15.0)
WBC: 6.4 10*3/uL (ref 3.8–10.8)

## 2019-04-27 NOTE — Telephone Encounter (Signed)
Pt was called and given results, she verbalized understanding. Pt did not want to schedule appts today and will call back

## 2019-04-27 NOTE — Telephone Encounter (Signed)
Please inform patient the following information: Her hgb went from 15.4> 12.8 and HCT 46.4>37.9.  Since she had approximately 2.5 point drop in Hgb and 8.5 pt drop in hct I would suggest we test it again in 4-5 months, prior to her donating blood to gauge how her body responds with production and decide then if and when she schedule donation. Appt with provider should be scheduled at that time to discuss.  Please schedule her for lab appt 2 days prior to a provider appt, so we can discuss results. Order placed

## 2019-04-27 NOTE — Telephone Encounter (Signed)
Pt was called and VM was left to return call  °

## 2019-04-28 ENCOUNTER — Encounter: Payer: Self-pay | Admitting: Gastroenterology

## 2019-05-04 NOTE — Telephone Encounter (Signed)
I did not document but on 11/30 I set pt up for pft and covid test and told her we will call for for hst & soon as we can get to her.  Nothing further needed.

## 2019-05-09 ENCOUNTER — Telehealth: Payer: Self-pay | Admitting: Pulmonary Disease

## 2019-05-09 NOTE — Telephone Encounter (Signed)
I have spoken to pt & rescheduled her hst.  Nothing further needed.

## 2019-05-09 NOTE — Telephone Encounter (Signed)
Routing to [place] for follow-up.

## 2019-05-09 NOTE — Telephone Encounter (Signed)
I called pt & left her vm to call me back.

## 2019-05-23 ENCOUNTER — Ambulatory Visit: Payer: BC Managed Care – PPO

## 2019-05-23 ENCOUNTER — Other Ambulatory Visit: Payer: Self-pay

## 2019-05-23 DIAGNOSIS — G4733 Obstructive sleep apnea (adult) (pediatric): Secondary | ICD-10-CM

## 2019-05-23 DIAGNOSIS — R0683 Snoring: Secondary | ICD-10-CM

## 2019-05-23 DIAGNOSIS — D751 Secondary polycythemia: Secondary | ICD-10-CM

## 2019-05-30 ENCOUNTER — Encounter: Payer: Self-pay | Admitting: Pulmonary Disease

## 2019-05-30 ENCOUNTER — Telehealth: Payer: Self-pay | Admitting: Pulmonary Disease

## 2019-05-30 DIAGNOSIS — G4733 Obstructive sleep apnea (adult) (pediatric): Secondary | ICD-10-CM | POA: Diagnosis not present

## 2019-05-30 HISTORY — DX: Obstructive sleep apnea (adult) (pediatric): G47.33

## 2019-05-30 NOTE — Telephone Encounter (Signed)
HST 05/23/19 >> AHI 10.5, SpO2 low 86%   Please inform her that her sleep study shows mild obstructive sleep apnea.  Please arrange for ROV with me or NP to discuss treatment options.

## 2019-05-30 NOTE — Telephone Encounter (Signed)
Unable to leave a vmail for the patient. Her voicemail box was full. Will have to try again later.

## 2019-06-06 ENCOUNTER — Ambulatory Visit: Payer: BC Managed Care – PPO | Admitting: Family Medicine

## 2019-06-07 ENCOUNTER — Encounter: Payer: BLUE CROSS/BLUE SHIELD | Admitting: Obstetrics & Gynecology

## 2019-06-12 ENCOUNTER — Other Ambulatory Visit (HOSPITAL_COMMUNITY): Payer: BC Managed Care – PPO

## 2019-06-20 NOTE — Progress Notes (Signed)
Virtual Visit via Video Note  I connected with Rebecca Pollard on 06/21/19 at 10:30 AM EST by a video enabled telemedicine application and verified that I am speaking with the correct person using two identifiers.  Location: Patient: Home Provider: Office - [facility] - [id number], [address], [place], [place] [address]  I discussed the limitations of evaluation and management by telemedicine and the availability of in person appointments. The patient expressed understanding and agreed to proceed. I also discussed with the patient that there may be a patient responsible charge related to this service. The patient expressed understanding and agreed to proceed.  Patient consented to consult via telephone: Yes People present and their role in pt care: Pt   History of Present Illness:  59 year old female never smoker initially consulted with our practice on 03/22/2019 for evaluation of suspected asthma as well as suspected obstructive sleep apnea  Past medical history: Vitamin D deficiency, hypertension, chronic kidney disease, Smoking history: Never smoker Maintenance: none Patient of Dr. Halford Chessman  Chief complaint: Review home sleep study results   59 year old female never smoker completing a MyChart video visit with our office today to review home sleep study results.  Home sleep study completed on 05/23/2019 showed mild obstructive sleep apnea with an AHI of 10.5.  Patient also continues to report hoarseness over the last year since having a upper respiratory infection earlier in 2020.  She feels that her voice is changed.  She has an occasional dry cough.  She also feels that she has increased dyspnea on exertion.  Patient also reporting today that she has intermittent issues swallowing.  She has never had a swallow study.  She has not discussed this with primary care.  Pulmonary function testing was ordered in October/2020.  Unfortunately this was not scheduled.  Upon chart review  of the patient on 06/20/2019 we saw PFTs had not been scheduled yet and patient was scheduled for pulmonary function testing in March/2021.  Observations/Objective:  HST 05/23/19 >> AHI 10.5, SpO2 low 86%  01/17/19 - CBC Diff - eos relative 0.9, eos absolute 0.1  Pulmonary tests:  A1AT 01/31/19 >> 141  Serology:  01/31/19 >> ANA positive 1:80 nuclear/speckled pattern; ds DNA, SCL 70, SSA/SSB, SM negative; ACE 32  Social History   Tobacco Use  Smoking Status Never Smoker  Smokeless Tobacco Never Used   Immunization History  Administered Date(s) Administered  . Influenza,inj,Quad PF,6+ Mos 05/31/2015, 06/21/2018, 01/17/2019  . Influenza-Unspecified 03/28/2013  . Tdap 03/13/2011   Assessment and Plan:  OSA (obstructive sleep apnea) Discussed CPAP therapy as well as oral device therapy  Patient is interested in proceeding forward with an oral device  Plan: Referral to Dr. Corky Sing office placed  Chronic bronchitis ([place]) Suspected asthma given history of seasonal allergies as well as increased dyspnea on exertion Peripheral eosinophils are low on blood work in August and October/2020  Plan: Complete pulmonary function testing as scheduled  Hoarseness, persistent Plan: Complete pulmonary function testing as scheduled May need to consider ENT referral for potential vocal cord evaluation  Dysphagia Patient reporting intermittent issues with swallowing She reports this does not persist weekly but will occasionally happen every couple weeks or months.  Discussion: Reviewed with patient that she could benefit from having a swallow study.  She would like to monitor her own symptoms.  She knows to follow-up with primary care if symptoms are persisting  Plan: Follow-up with primary care if swallowing continues to be an issue    Follow  Up Instructions:  Return in about 2 months (around 08/19/2019), or if symptoms worsen or fail to improve, for Follow up with Dr. Halford Chessman,  Follow up for PFT.    I discussed the assessment and treatment plan with the patient. The patient was provided an opportunity to ask questions and all were answered. The patient agreed with the plan and demonstrated an understanding of the instructions.   The patient was advised to call back or seek an in-person evaluation if the symptoms worsen or if the condition fails to improve as anticipated.  I provided 32 minutes of non-face-to-face time during this encounter.   Lauraine Rinne, NP

## 2019-06-20 NOTE — Telephone Encounter (Signed)
Able tor each the patient and advised her of the results. She voiced understanding. Patient scheduled for video visit with NP on 06/21/19. Nothing further needed at this time.

## 2019-06-21 ENCOUNTER — Telehealth (INDEPENDENT_AMBULATORY_CARE_PROVIDER_SITE_OTHER): Payer: BC Managed Care – PPO | Admitting: Pulmonary Disease

## 2019-06-21 ENCOUNTER — Encounter: Payer: Self-pay | Admitting: Pulmonary Disease

## 2019-06-21 DIAGNOSIS — R1310 Dysphagia, unspecified: Secondary | ICD-10-CM | POA: Diagnosis not present

## 2019-06-21 DIAGNOSIS — J410 Simple chronic bronchitis: Secondary | ICD-10-CM

## 2019-06-21 DIAGNOSIS — G4733 Obstructive sleep apnea (adult) (pediatric): Secondary | ICD-10-CM | POA: Diagnosis not present

## 2019-06-21 DIAGNOSIS — R490 Dysphonia: Secondary | ICD-10-CM | POA: Diagnosis not present

## 2019-06-21 NOTE — Progress Notes (Signed)
Reviewed and agree with assessment/plan.   Advith Martine, MD [facility] 05/20/2016, 12:24 PM Pager:  [phone]  

## 2019-06-21 NOTE — Assessment & Plan Note (Signed)
Plan: Complete pulmonary function testing as scheduled May need to consider ENT referral for potential vocal cord evaluation

## 2019-06-21 NOTE — Assessment & Plan Note (Signed)
Discussed CPAP therapy as well as oral device therapy  Patient is interested in proceeding forward with an oral device  Plan: Referral to Dr. Corky Sing office placed

## 2019-06-21 NOTE — Patient Instructions (Addendum)
You were seen today by Lauraine Rinne, NP  For:   1. OSA (obstructive sleep apnea)  - Ambulatory referral to Dentistry  We will refer you to Dr. Augustina Mood for your oral appliance  [address] [place]. [place], [place] [address] Phone: [phone] Website: [email or web address]  You will need to have six-month follow-up with Dr. Toy Cookey or your dentist to ensure your proper dentition and no issues with oral appliance.   We will also need to have you follow-up in 6 months with our office with a home sleep study to ensure that your sleep apnea is being treated adequately.   2. Dysphagia, unspecified type  If you continue to have symptoms of dysphagia or trouble swallowing please contact primary care for further evaluation  3. Hoarseness, persistent  As discussed today we will complete your pulmonary function testing  If hoarseness continues to persist then may need to consider evaluation by ENT  4. Simple chronic bronchitis ([place])  We will complete pulmonary function testing as scheduled   We recommend today:  Orders Placed This Encounter  Procedures  . Ambulatory referral to Dentistry    Referral Priority:   Routine    Referral Type:   Consultation    Referral Reason:   Specialty Services Required    Requested Specialty:   Dental General Practice    Number of Visits Requested:   1   Orders Placed This Encounter  Procedures  . Ambulatory referral to Dentistry   No orders of the defined types were placed in this encounter.   Follow Up:    Return in about 2 months (around 08/19/2019), or if symptoms worsen or fail to improve, for Follow up with Dr. Halford Chessman, Follow up for PFT.   Please do your part to reduce the spread of COVID-19:      Reduce your risk of any infection  and COVID19 by using the similar precautions used for avoiding the common cold or flu:  Marland Kitchen Wash your hands often with soap and warm water for at least 20 seconds.  If soap and water are not  readily available, use an alcohol-based hand sanitizer with at least 60% alcohol.  . If coughing or sneezing, cover your mouth and nose by coughing or sneezing into the elbow areas of your shirt or coat, into a tissue or into your sleeve (not your hands). Langley Gauss A MASK when in public  . Avoid shaking hands with others and consider head nods or verbal greetings only. . Avoid touching your eyes, nose, or mouth with unwashed hands.  . Avoid close contact with people who are sick. . Avoid places or events with large numbers of people in one location, like concerts or sporting events. . If you have some symptoms but not all symptoms, continue to monitor at home and seek medical attention if your symptoms worsen. . If you are having a medical emergency, call 911.   [place] / e-Visit: [email or web address]         [facility]: [facility]: [id number]                   [facility]: [id number]     It is flu season:   >>> Best ways to protect herself from the flu: Receive the yearly flu vaccine, practice good hand hygiene washing with soap and also using hand sanitizer when available, eat a nutritious meals,  get adequate rest, hydrate appropriately   Please contact the office if your symptoms worsen or you have concerns that you are not improving.   Thank you for choosing [facility] for your healthcare, and for allowing [place] to partner with you on your healthcare journey. I am thankful to be able to provide care to you today.   Wyn Quaker FNP-C

## 2019-06-21 NOTE — Assessment & Plan Note (Addendum)
Suspected asthma given history of seasonal allergies as well as increased dyspnea on exertion Peripheral eosinophils are low on blood work in August and October/2020  Plan: Complete pulmonary function testing as scheduled

## 2019-06-21 NOTE — Assessment & Plan Note (Addendum)
Patient reporting intermittent issues with swallowing She reports this does not persist weekly but will occasionally happen every couple weeks or months.  Discussion: Reviewed with patient that she could benefit from having a swallow study.  She would like to monitor her own symptoms.  She knows to follow-up with primary care if symptoms are persisting  Plan: Follow-up with primary care if swallowing continues to be an issue

## 2019-06-26 ENCOUNTER — Ambulatory Visit: Payer: BC Managed Care – PPO | Admitting: Family Medicine

## 2019-06-26 ENCOUNTER — Encounter: Payer: Self-pay | Admitting: Family Medicine

## 2019-06-26 ENCOUNTER — Telehealth: Payer: Self-pay | Admitting: Family Medicine

## 2019-06-26 ENCOUNTER — Other Ambulatory Visit: Payer: Self-pay

## 2019-06-26 VITALS — BP 122/85 | HR 83 | Temp 98.2°F | Resp 16 | Ht 65.0 in | Wt 188.1 lb

## 2019-06-26 DIAGNOSIS — D751 Secondary polycythemia: Secondary | ICD-10-CM

## 2019-06-26 DIAGNOSIS — R079 Chest pain, unspecified: Secondary | ICD-10-CM

## 2019-06-26 DIAGNOSIS — D582 Other hemoglobinopathies: Secondary | ICD-10-CM

## 2019-06-26 DIAGNOSIS — I10 Essential (primary) hypertension: Secondary | ICD-10-CM | POA: Diagnosis not present

## 2019-06-26 LAB — LIPID PANEL
Cholesterol: 290 mg/dL — ABNORMAL HIGH (ref 0–200)
HDL: 72.9 mg/dL (ref >39.00)
LDL Cholesterol: 183 mg/dL — ABNORMAL HIGH (ref 0–99)
NonHDL: 216.75
Total CHOL/HDL Ratio: 4
Triglycerides: 169 mg/dL — ABNORMAL HIGH (ref 0.0–149.0)
VLDL: 33.8 mg/dL (ref 0.0–40.0)

## 2019-06-26 LAB — CBC
HCT: 44 % (ref 36.0–46.0)
Hemoglobin: 14.5 g/dL (ref 12.0–15.0)
MCHC: 33 g/dL (ref 30.0–36.0)
MCV: 93 fl (ref 78.0–100.0)
Platelets: 303 10*3/uL (ref 150.0–400.0)
RBC: 4.73 Mil/uL (ref 3.87–5.11)
RDW: 12.9 % (ref 11.5–15.5)
WBC: 6.3 10*3/uL (ref 4.0–10.5)

## 2019-06-26 LAB — T4, FREE: Free T4: 0.78 ng/dL (ref 0.60–1.60)

## 2019-06-26 LAB — TSH: TSH: 1.07 u[IU]/mL (ref 0.35–4.50)

## 2019-06-26 LAB — T3, FREE: T3, Free: 3.1 pg/mL (ref 2.3–4.2)

## 2019-06-26 MED ORDER — AMLODIPINE BESYLATE 5 MG PO TABS: 5.0000 mg | ORAL_TABLET | Freq: Every day | ORAL | 1 refills | Status: DC

## 2019-06-26 MED ORDER — ATORVASTATIN CALCIUM 20 MG PO TABS: 20.0000 mg | ORAL_TABLET | Freq: Every day | ORAL | 3 refills | Status: DC

## 2019-06-26 NOTE — Addendum Note (Signed)
Addended by: Howard Pouch A on: 06/26/2019 05:04 PM   Modules accepted: Orders

## 2019-06-26 NOTE — Telephone Encounter (Signed)
Statin prescribed

## 2019-06-26 NOTE — Telephone Encounter (Signed)
Pt was called and given lab results. She would like to start Statin medication, please send to [facility].

## 2019-06-26 NOTE — Progress Notes (Signed)
Rebecca Pollard , May 06, 1961, 59 y.o., female MRN: [id number] Patient Care Team    Relationship Specialty Notifications Start End  Ma Hillock, DO PCP - General Family Medicine  05/31/15   Michel Bickers, MD Consulting Physician Infectious Diseases  01/23/19   Nicholas Lose, MD Consulting Physician Hematology and Oncology  04/27/19   Mansouraty, Telford Nab., MD Consulting Physician Gastroenterology  04/27/19   Chesley Mires, MD Consulting Physician Pulmonary Disease  04/27/19     Chief Complaint  Patient presents with  . Hypertension    Pt checks BP at home and states it is "all over the place". Has BP logs with her.  Pt takes BP medication at 9am and is taking BP when she wakes up in the AM, before meds     Subjective: Pt presents for an OV Hypertension/obesity/chest pain: Pt reports compliance with amlodipine 5 mg daily.  Lisinopril was discontinued secondary to mild CKD with use.  Blood pressures ranges at home in normal limits.  Heart rates are within normal limits. Patient denies shortness of breath, dizziness or lower extremity edema.  Patient has concerns she has "something going on with her heart.  "She states she has been having chest discomfort, dizziness and nauseated at times.  She has a racing heart when she first wakes up in the morning.  She reports 2 of those occasions it felt like a very sharp pain.  She has a history of reflux. She has a family history of stroke in her father. Diet: Low-sodium Exercise: Routine exercise RF: Hypertension, obesity, family history of stroke  Echo 2015: Study Conclusions  - Left ventricle: The cavity size was normal. Systolic function was  normal. The estimated ejection fraction was in the range of 55%  to 60%. Wall motion was normal; there were no regional wall  motion abnormalities.  - Mitral valve: There was trivial regurgitation.  - Atrial septum: There was increased thickness of the septum,  consistent with lipomatous  hypertrophy.  - Tricuspid valve: There was mild regurgitation.    Erythrocytosis/elevated hemoglobin: Patient would like to have her CBC checked again today.  She had it checked 2 months ago after giving blood to decrease her red blood cell count.  A few weeks after donation her CBC was within normal limits.  We are now 2 months outside of her donation.  She is following with hematology for this condition.  She is no longer taking baby aspirin secondary to it irritating her stomach.  Depression screen [facility] 2/9 09/17/2017  Decreased Interest 0  Down, Depressed, Hopeless 0  PHQ - 2 Score 0    Allergies  Allergen Reactions  . Losartan     Decreased kidney fx   Social History   Social History Narrative   Married to [place]. 2 children, Alice and [place].    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    Drinks caffeine, herbal remedies, daily vitamin use.    Wears her seatbelt, smoke detector in the home, no firearms in the home.    Feels safe in her relationships.    Exercises 2x a week.    Past Medical History:  Diagnosis Date  . Asthma   . Chronic bronchitis ([place])   . Chronic fatigue   . CKD (chronic kidney disease) stage 3, GFR 30-59 ml/min 01/26/2019  . Fibromyalgia   . GERD (gastroesophageal reflux disease)   . Glaucoma   . Hypertension 2019  . IBS (irritable bowel syndrome)   . OSA (obstructive sleep apnea)  05/30/2019  . Osteopenia 09/2016   T score -1.3 FRAX 5.3%/0.3%  . Palpitations    echo and stress test normal 2015 per pt  . Secondary polycythemia 2020  . Urinary incontinence    Past Surgical History:  Procedure Laterality Date  . CHOLECYSTECTOMY  1989  . ERCP    . TRANSTHORACIC ECHOCARDIOGRAM  11/2013   EF 50-55%, normal LV function, normal wall motion, triavial MR and mild tricuspid regurg.   Family History  Problem Relation Age of Onset  . Hearing loss Mother   . Arthritis Father   . Hearing loss Father   . Stroke Father   . Breast cancer Paternal  Grandmother   . Leukemia Maternal Grandmother   . Colon cancer Neg Hx   . Esophageal cancer Neg Hx   . Rectal cancer Neg Hx   . Stomach cancer Neg Hx   . Colon polyps Neg Hx    Allergies as of 06/26/2019      Reactions   Losartan    Decreased kidney fx      Medication List       Accurate as of June 26, 2019  5:53 PM. If you have any questions, ask your nurse or doctor.        albuterol 108 (90 Base) MCG/ACT inhaler Commonly known as: VENTOLIN HFA Inhale 1-2 puffs into the lungs every 6 (six) hours as needed for wheezing or shortness of breath.   amLODipine 5 MG tablet Commonly known as: NORVASC Take 1 tablet (5 mg total) by mouth daily.   aspirin EC 81 MG tablet Take 81 mg by mouth 2 (two) times daily.   atorvastatin 20 MG tablet Commonly known as: LIPITOR Take 1 tablet (20 mg total) by mouth daily. Started by: Howard Pouch, DO   co-enzyme Q-10 30 MG capsule Take 30 mg by mouth 3 (three) times daily.   DHEA PO Take 6 mg by mouth daily.   estradiol 0.1 MG/GM vaginal cream Commonly known as: ESTRACE Place 1 Applicatorful vaginally at bedtime.   multivitamin with minerals tablet Take 1 tablet by mouth daily.   omeprazole 40 MG capsule Commonly known as: PRILOSEC Take 1 capsule (40 mg total) by mouth 2 (two) times daily.   vitamin B-12 100 MCG tablet Commonly known as: CYANOCOBALAMIN Take 100 mcg by mouth daily.   Vitamin C 500 MG Caps Take 2 tablets by mouth 2 (two) times daily.   VITAMIN D PO Take by mouth.       All past medical history, surgical history, allergies, family history, immunizations andmedications were updated in the EMR today and reviewed under the history and medication portions of their EMR.     ROS: Negative, with the exception of above mentioned in HPI   Objective:  BP 122/85 (BP Location: Right Arm, Patient Position: Sitting, Cuff Size: Normal)   Pulse 83   Temp 98.2 F (36.8 C) (Temporal)   Resp 16   Ht 5\' 5"  (1.651 m)    Wt 188 lb 2 oz (85.3 kg)   LMP 04/16/2016 (Approximate)   SpO2 100%   BMI 31.31 kg/m  Body mass index is 31.31 kg/m. Gen: Afebrile. No acute distress.  HENT: AT. [place].  Eyes:Pupils Equal Round Reactive to light, Extraocular movements intact,  Conjunctiva without redness, discharge or icterus. Neck/lymp/endocrine: Supple, no lymphadenopathy, no thyromegaly CV: RRR no murmur, no edema Chest: CTAB, no wheeze or crackles Neuro:  Normal gait. PERLA. EOMi. Alert. Oriented x3    No exam  data present No results found. Results for orders placed or performed in visit on 06/26/19 (from the past 24 hour(s))  TSH     Status: None   Collection Time: 06/26/19 10:14 AM  Result Value Ref Range   TSH 1.07 0.35 - 4.50 uIU/mL  T4, free     Status: None   Collection Time: 06/26/19 10:14 AM  Result Value Ref Range   Free T4 0.78 0.60 - 1.60 ng/dL  T3, free     Status: None   Collection Time: 06/26/19 10:14 AM  Result Value Ref Range   T3, Free 3.1 2.3 - 4.2 pg/mL  Lipid panel     Status: Abnormal   Collection Time: 06/26/19 10:14 AM  Result Value Ref Range   Cholesterol 290 (H) 0 - 200 mg/dL   Triglycerides 169.0 (H) 0.0 - 149.0 mg/dL   HDL 72.90 >39.00 mg/dL   VLDL 33.8 0.0 - 40.0 mg/dL   LDL Cholesterol 183 (H) 0 - 99 mg/dL   Total CHOL/HDL Ratio 4    NonHDL 216.75   CBC     Status: None   Collection Time: 06/26/19 10:14 AM  Result Value Ref Range   WBC 6.3 4.0 - 10.5 K/uL   RBC 4.73 3.87 - 5.11 Mil/uL   Platelets 303.0 150.0 - 400.0 K/uL   Hemoglobin 14.5 12.0 - 15.0 g/dL   HCT 44.0 36.0 - 46.0 %   MCV 93.0 78.0 - 100.0 fl   MCHC 33.0 30.0 - 36.0 g/dL   RDW 12.9 11.5 - 15.5 %    Assessment/Plan: Rebecca Pollard is a 59 y.o. female present for OV for  Essential hypertension/obesity/chest pain/racing heart -Pressures are stable. -Continue amlodipine 5 mg daily. - cardiology referral placed today for reports of chest discomfort. -Low-sodium diet, routine exercise encouraged -CBC,  TSH and lipids collected this visit. -Family history of stroke in her father.  If cholesterol is elevated, would encourage start of statin for CV protection -Follow-up in 6 months.  Elevated hemoglobin/erythrocytosis: Patient is believed by hematology to have elevation in hemoglobin secondary to other cause.   She has pulmonology appointment coming up this week. Routine follow-up with her hematologist. We will collect CBC today-however encouraged her to follow-up with her hematologist for this condition.   Reviewed expectations re: course of current medical issues.  Discussed self-management of symptoms.  Outlined signs and symptoms indicating need for more acute intervention.  Patient verbalized understanding and all questions were answered.  Patient received an After-Visit Summary.    Orders Placed This Encounter  Procedures  . TSH  . T4, free  . T3, free  . Lipid panel  . CBC  . Ambulatory referral to Cardiology   Orders Placed This Encounter  Procedures  . TSH  . T4, free  . T3, free  . Lipid panel  . CBC  . Ambulatory referral to Cardiology   Meds ordered this encounter  Medications  . amLODipine (NORVASC) 5 MG tablet    Sig: Take 1 tablet (5 mg total) by mouth daily.    Dispense:  90 tablet    Refill:  1    Referral Orders     Ambulatory referral to Cardiology    Note is dictated utilizing voice recognition software. Although note has been proof read prior to signing, occasional typographical errors still can be missed. If any questions arise, please do not hesitate to call for verification.   electronically signed by:  Howard Pouch, DO  [facility] -  OR

## 2019-06-26 NOTE — Telephone Encounter (Signed)
Please inform patient the following information: Her thyroid panel is normal. Her blood cell counts are still within normal range but continue to steadily rise now up to 14.5 hemoglobin and hematocrit of 44.  If levels continue to rise this same amount per month, she will be elevated again on both hemoglobin and hematocrit in about 2 months.  Would encourage her to make sure she follows up with her hematologist -within the next couple months for reevaluation. Her cholesterol levels are rather significantly high with a bad/LDL cholesterol greater than 180.  It does put her at higher cardiovascular risk for heart attack or stroke.  She does meet American Heart Association's criteria for starting a statin medication to help lower her cholesterol and also give her cardiovascular protection for heart attack and stroke.  If she would like to start this medication today I can call this in for her.  -I also referred her to cardiology.  If desiring she can talk it over with them as well for their opinion. Please advise

## 2019-06-26 NOTE — Patient Instructions (Signed)
Great to see you today.  I have refilled your BP med and referred you to cardiology.   I will call you with results of labs.

## 2019-06-29 ENCOUNTER — Encounter: Payer: Self-pay | Admitting: Cardiovascular Disease

## 2019-06-29 ENCOUNTER — Ambulatory Visit (INDEPENDENT_AMBULATORY_CARE_PROVIDER_SITE_OTHER): Payer: BC Managed Care – PPO | Admitting: Cardiovascular Disease

## 2019-06-29 ENCOUNTER — Other Ambulatory Visit: Payer: Self-pay

## 2019-06-29 ENCOUNTER — Telehealth: Payer: Self-pay | Admitting: Radiology

## 2019-06-29 VITALS — BP 150/93 | HR 97 | Ht 65.0 in | Wt 187.0 lb

## 2019-06-29 DIAGNOSIS — I10 Essential (primary) hypertension: Secondary | ICD-10-CM | POA: Diagnosis not present

## 2019-06-29 DIAGNOSIS — R002 Palpitations: Secondary | ICD-10-CM

## 2019-06-29 DIAGNOSIS — E782 Mixed hyperlipidemia: Secondary | ICD-10-CM | POA: Diagnosis not present

## 2019-06-29 DIAGNOSIS — R0789 Other chest pain: Secondary | ICD-10-CM | POA: Diagnosis not present

## 2019-06-29 DIAGNOSIS — E785 Hyperlipidemia, unspecified: Secondary | ICD-10-CM | POA: Insufficient documentation

## 2019-06-29 MED ORDER — METOPROLOL TARTRATE 100 MG PO TABS: ORAL_TABLET | ORAL | 0 refills | Status: DC

## 2019-06-29 NOTE — Telephone Encounter (Signed)
Enrolled patient for a 14 day Zio monitor to be mailed to patients home.   Per nurse ok to switch from a Zio AT to a Zio XT due to insurance.

## 2019-06-29 NOTE — Assessment & Plan Note (Signed)
History of essential hypertension with blood pressure measured today at 150/93 although her blood pressure measurements at home are much better.  She is on amlodipine 5 mg a day.

## 2019-06-29 NOTE — Patient Instructions (Signed)
Medication Instructions:  NO CHANGE *If you need a refill on your cardiac medications before your next appointment, please call your pharmacy*  Lab Work: Your physician recommends that you return for lab work in:[address] [place]  If you have labs (blood work) drawn today and your tests are completely normal, you will receive your results only by: Marland Kitchen MyChart Message (if you have MyChart) OR . A paper copy in the mail If you have any lab test that is abnormal or we need to change your treatment, we will call you to review the results.  Testing/Procedures: Your cardiac CT will be scheduled at one of the below locations:   [facility] [address] [place], [place] [address] [phone]  [place] [address] [place], [place] [address] [phone]  If scheduled at [facility], please arrive at the [facility] main entrance of [facility] 30 minutes prior to test start time. Proceed to the [facility] Radiology Department (first floor) to check-in and test prep.  If scheduled at [facility], please arrive 15 mins early for check-in and test prep.  Please follow these instructions carefully (unless otherwise directed):  Hold all erectile dysfunction medications at least 3 days (72 hrs) prior to test.  On the Night Before the Test: . Be sure to Drink plenty of water. . Do not consume any caffeinated/decaffeinated beverages or chocolate 12 hours prior to your test. . Do not take any antihistamines 12 hours prior to your test.  On the Day of the Test: . Drink plenty of water. Do not drink any water within one hour of the test. . Do not eat any food 4 hours prior to the test. . You may take your regular medications prior to the test.  . Take metoprolol (Lopressor) 100 MG two hours prior to test. . HOLD Furosemide/Hydrochlorothiazide morning of the  test. . FEMALES- please wear underwire-free bra if available      After the Test: . Drink plenty of water. . After receiving IV contrast, you may experience a mild flushed feeling. This is normal. . On occasion, you may experience a mild rash up to 24 hours after the test. This is not dangerous. If this occurs, you can take Benadryl 25 mg and increase your fluid intake. . If you experience trouble breathing, this can be serious. If it is severe call 911 IMMEDIATELY. If it is mild, please call our office. . If you take any of these medications: Glipizide/Metformin, Avandament, Glucavance, please do not take 48 hours after completing test unless otherwise instructed.   Once we have confirmed authorization from your insurance company, we will call you to set up a date and time for your test.   For non-scheduling related questions, please contact the cardiac imaging nurse navigator should you have any questions/concerns: Marchia Bond, RN Navigator Cardiac Imaging [facility] [phone] mobile   ZIO XT- Long Term Monitor Instructions   Your physician has requested you wear your ZIO patch monitor____14___days.   This is a single patch monitor.  [employer] supplies one patch monitor per enrollment.  Additional stickers are not available.   Please do not apply patch if you will be having a Nuclear Stress Test, Echocardiogram, Cardiac CT, MRI, or Chest Xray during the time frame you would be wearing the monitor. The patch cannot be worn during these tests.  You cannot remove and re-apply  the ZIO XT patch monitor.   Your ZIO patch monitor will be sent USPS Priority mail from [facility] directly to your home address. The monitor may also be mailed to a PO BOX if home delivery is not available.   It may take 3-5 days to receive your monitor after you have been enrolled.   Once you have received you monitor, please review enclosed instructions.  Your monitor has  already been registered assigning a specific monitor serial # to you.   Applying the monitor   Shave hair from upper left chest.   Hold abrader disc by orange tab.  Rub abrader in 40 strokes over left upper chest as indicated in your monitor instructions.   Clean area with 4 enclosed alcohol pads .  Use all pads to assure are is cleaned thoroughly.  Let dry.   Apply patch as indicated in monitor instructions.  Patch will be place under collarbone on left side of chest with arrow pointing upward.   Rub patch adhesive wings for 2 minutes.Remove white label marked "1".  Remove white label marked "2".  Rub patch adhesive wings for 2 additional minutes.   While looking in a mirror, press and release button in center of patch.  A small green light will flash 3-4 times .  This will be your only indicator the monitor has been turned on.     Do not shower for the first 24 hours.  You may shower after the first 24 hours.   Press button if you feel a symptom. You will hear a small click.  Record Date, Time and Symptom in the Patient Log Book.   When you are ready to remove patch, follow instructions on last 2 pages of Patient Log Book.  Stick patch monitor onto last page of Patient Log Book.   Place Patient Log Book in [place] box.  Use locking tab on box and tape box closed securely.  The [employer] has [employer] on it.  Please place in mailbox as soon as possible.  Your physician should have your test results approximately 7 days after the monitor has been mailed back to [facility].   Call [place] at [phone] if you have questions regarding your ZIO XT patch monitor.  Call them immediately if you see an orange light blinking on your monitor.   If your monitor falls off in less than 4 days contact our Monitor department at [phone].  If your monitor becomes loose or falls off after 4 days call [employer] at [phone] for suggestions on securing your  monitor.    Follow-Up: At [facility] you and your health needs are our priority.  As part of our continuing mission to provide you with exceptional heart care, we have created designated Provider Care Teams.  These Care Teams include your primary Cardiologist (physician) and Advanced Practice Providers (APPs -  Physician Assistants and Nurse Practitioners) who all work together to provide you with the care you need, when you need it.  Your next appointment:   1 month(s)  The format for your next appointment:   Either In Person or Virtual  Provider:   Quay Burow, MD

## 2019-06-29 NOTE — Progress Notes (Signed)
06/29/2019 [name]   1960-06-06  [id number]  Primary Physician Ma Hillock, DO Primary Cardiologist: Lorretta Harp MD Lupe Car[place], [place]  HPI:  Rebecca Pollard is a 59 y.o. mildly overweight married Caucasian female mother of 3 children who does not work and was referred by Dr. Raoul Pitch for cardiovascular evaluation because of chest pain.  She does have a history of treated hypertension hyperlipidemia.  There is no family history of heart disease patient never had a heart attack or stroke.  She did have a GXT approxi-10 years ago for evaluation of chest pain which was negative.  She does have polycythemia of unknown type and is currently being evaluated by hematology.  She had new onset chest pain on 06/22/2019 with 2 episodes were fairly brief.  She had recurrent episode the following day that awakened her from sleep rating to her left shoulder and on the 30th as well.  She said no subsequent episodes.   Current Meds  Medication Sig  . albuterol (PROVENTIL HFA;VENTOLIN HFA) 108 (90 Base) MCG/ACT inhaler Inhale 1-2 puffs into the lungs every 6 (six) hours as needed for wheezing or shortness of breath.  Marland Kitchen amLODipine (NORVASC) 5 MG tablet Take 1 tablet (5 mg total) by mouth daily.  . Ascorbic Acid (VITAMIN C) 500 MG CAPS Take 2 tablets by mouth 2 (two) times daily.  Marland Kitchen aspirin EC 81 MG tablet Take 81 mg by mouth 2 (two) times daily.  Marland Kitchen atorvastatin (LIPITOR) 20 MG tablet Take 1 tablet (20 mg total) by mouth daily.  Marland Kitchen co-enzyme Q-10 30 MG capsule Take 30 mg by mouth 3 (three) times daily.  Marland Kitchen estradiol (ESTRACE) 0.1 MG/GM vaginal cream Place 1 Applicatorful vaginally at bedtime.  . Multiple Vitamins-Minerals (MULTIVITAMIN WITH MINERALS) tablet Take 1 tablet by mouth daily.  . Nutritional Supplements (DHEA PO) Take 6 mg by mouth daily.  Marland Kitchen omeprazole (PRILOSEC) 40 MG capsule Take 1 capsule (40 mg total) by mouth 2 (two) times daily.  . vitamin B-12 (CYANOCOBALAMIN) 100 MCG tablet  Take 100 mcg by mouth daily.  Marland Kitchen VITAMIN D PO Take by mouth.     Allergies  Allergen Reactions  . Losartan     Decreased kidney fx    Social History   Socioeconomic History  . Marital status: Married    Spouse name: Eddie Dibbles  . Number of children: 3  . Years of education: Not on file  . Highest education level: Not on file  Occupational History  . Occupation: Water quality scientist  Tobacco Use  . Smoking status: Never Smoker  . Smokeless tobacco: Never Used  Substance and Sexual Activity  . Alcohol use: Not Currently    Comment: rare  . Drug use: No  . Sexual activity: Yes    Partners: Male    Birth control/protection: Other-see comments    Comment: 1st intercourse- 67, patners- 83, married- 56 yrs   Other Topics Concern  . Not on file  Social History Narrative   Married to [place]. 2 children, Alice and Bunk Foss.    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    Drinks caffeine, herbal remedies, daily vitamin use.    Wears her seatbelt, smoke detector in the home, no firearms in the home.    Feels safe in her relationships.    Exercises 2x a week.    Social Determinants of Health   Financial Resource Strain:   . Difficulty of Paying Living Expenses: Not on file  Food Insecurity:   .  Worried About Charity fundraiser in the Last Year: Not on file  . Ran Out of Food in the Last Year: Not on file  Transportation Needs:   . Lack of Transportation (Medical): Not on file  . Lack of Transportation (Non-Medical): Not on file  Physical Activity:   . Days of Exercise per Week: Not on file  . Minutes of Exercise per Session: Not on file  Stress:   . Feeling of Stress : Not on file  Social Connections:   . Frequency of Communication with Friends and Family: Not on file  . Frequency of Social Gatherings with Friends and Family: Not on file  . Attends Religious Services: Not on file  . Active Member of Clubs or Organizations: Not on file  . Attends Archivist Meetings:  Not on file  . Marital Status: Not on file  Intimate Partner Violence:   . Fear of Current or Ex-Partner: Not on file  . Emotionally Abused: Not on file  . Physically Abused: Not on file  . Sexually Abused: Not on file     Review of Systems: General: negative for chills, fever, night sweats or weight changes.  Cardiovascular: negative for chest pain, dyspnea on exertion, edema, orthopnea, palpitations, paroxysmal nocturnal dyspnea or shortness of breath Dermatological: negative for rash Respiratory: negative for cough or wheezing Urologic: negative for hematuria Abdominal: negative for nausea, vomiting, diarrhea, bright red blood per rectum, melena, or hematemesis Neurologic: negative for visual changes, syncope, or dizziness All other systems reviewed and are otherwise negative except as noted above.    Blood pressure (!) 150/93, pulse 97, height 5\' 5"  (1.651 m), weight 187 lb (84.8 kg), last menstrual period 04/16/2016, SpO2 100 %.  General appearance: alert and no distress Neck: no adenopathy, no carotid bruit, no JVD, supple, symmetrical, trachea midline and thyroid not enlarged, symmetric, no tenderness/mass/nodules Lungs: clear to auscultation bilaterally Heart: regular rate and rhythm, S1, S2 normal, no murmur, click, rub or gallop Extremities: extremities normal, atraumatic, no cyanosis or edema Pulses: 2+ and symmetric Skin: Skin color, texture, turgor normal. No rashes or lesions Neurologic: Alert and oriented X 3, normal strength and tone. Normal symmetric reflexes. Normal coordination and gait  EKG sinus rhythm at 97 without ST or T wave changes.  I Personally reviewed this EKG.  ASSESSMENT AND PLAN:   Essential hypertension History of essential hypertension with blood pressure measured today at 150/93 although her blood pressure measurements at home are much better.  She is on amlodipine 5 mg a day.  Hyperlipidemia History of hyperlipidemia recently started on a  statin drug lipid profile performed 06/26/2019 revealed a total cholesterol 290, LDL 183 and HDL 72.  She was started on atorvastatin 20 mg a day.  We will recheck a lipid liver profile in 2 months.  Atypical chest pain Recent onset atypical chest pain which began first on 06/22/2019 when she had 2 episodes of chest pain.  On the 29th she was awakened from sleep with an episode rating to her left shoulder and again awakened from sleep on the 30th.  She she has had no recurrent symptoms.  I am going to get a coronary CTA next week to further evaluate.  Palpitations History of palpitations which she has had for years that occur on a daily basis.  I am going to get a 2-week Zio patch to further evaluate.      Lorretta Harp MD FACP,FACC,FAHA, [facility] 06/29/2019 2:37 PM

## 2019-06-29 NOTE — Assessment & Plan Note (Signed)
History of palpitations which she has had for years that occur on a daily basis.  I am going to get a 2-week Zio patch to further evaluate.

## 2019-06-29 NOTE — Assessment & Plan Note (Signed)
History of hyperlipidemia recently started on a statin drug lipid profile performed 06/26/2019 revealed a total cholesterol 290, LDL 183 and HDL 72.  She was started on atorvastatin 20 mg a day.  We will recheck a lipid liver profile in 2 months.

## 2019-06-29 NOTE — Assessment & Plan Note (Signed)
Recent onset atypical chest pain which began first on 06/22/2019 when she had 2 episodes of chest pain.  On the 29th she was awakened from sleep with an episode rating to her left shoulder and again awakened from sleep on the 30th.  She she has had no recurrent symptoms.  I am going to get a coronary CTA next week to further evaluate.

## 2019-07-03 DIAGNOSIS — E782 Mixed hyperlipidemia: Secondary | ICD-10-CM | POA: Diagnosis not present

## 2019-07-04 ENCOUNTER — Other Ambulatory Visit (INDEPENDENT_AMBULATORY_CARE_PROVIDER_SITE_OTHER): Payer: BC Managed Care – PPO

## 2019-07-04 DIAGNOSIS — R002 Palpitations: Secondary | ICD-10-CM | POA: Diagnosis not present

## 2019-07-04 LAB — HEPATIC FUNCTION PANEL
ALT: 19 IU/L (ref 0–32)
AST: 23 IU/L (ref 0–40)
Albumin: 4.5 g/dL (ref 3.8–4.9)
Alkaline Phosphatase: 121 IU/L — ABNORMAL HIGH (ref 39–117)
Bilirubin Total: 0.3 mg/dL (ref 0.0–1.2)
Bilirubin, Direct: 0.11 mg/dL (ref 0.00–0.40)
Total Protein: 7.4 g/dL (ref 6.0–8.5)

## 2019-07-04 LAB — LIPID PANEL
Chol/HDL Ratio: 3.6 ratio (ref 0.0–4.4)
Cholesterol, Total: 239 mg/dL — ABNORMAL HIGH (ref 100–199)
HDL: 66 mg/dL (ref >39)
LDL Chol Calc (NIH): 154 mg/dL — ABNORMAL HIGH (ref 0–99)
Triglycerides: 109 mg/dL (ref 0–149)
VLDL Cholesterol Cal: 19 mg/dL (ref 5–40)

## 2019-07-06 ENCOUNTER — Telehealth: Payer: Self-pay

## 2019-07-06 NOTE — Telephone Encounter (Signed)
Called to try to give results. Went to VM and unable to leave message d/t VM being full.

## 2019-07-06 NOTE — Telephone Encounter (Signed)
-----   Message from Lorretta Harp, MD sent at 07/04/2019 10:42 AM EST ----- LDL 154 on atorvastatin 20 mg a day.  Not at goal for primary prevention.  Increase to 40 mg a day and recheck in 2 months.

## 2019-07-11 ENCOUNTER — Other Ambulatory Visit: Payer: Self-pay

## 2019-07-11 ENCOUNTER — Telehealth: Payer: Self-pay

## 2019-07-11 ENCOUNTER — Other Ambulatory Visit: Payer: Self-pay | Admitting: Gastroenterology

## 2019-07-11 DIAGNOSIS — K259 Gastric ulcer, unspecified as acute or chronic, without hemorrhage or perforation: Secondary | ICD-10-CM

## 2019-07-11 DIAGNOSIS — K2970 Gastritis, unspecified, without bleeding: Secondary | ICD-10-CM

## 2019-07-11 DIAGNOSIS — E782 Mixed hyperlipidemia: Secondary | ICD-10-CM

## 2019-07-11 MED ORDER — ATORVASTATIN CALCIUM 40 MG PO TABS: 40.0000 mg | ORAL_TABLET | Freq: Every day | ORAL | 3 refills | Status: DC

## 2019-07-11 NOTE — Telephone Encounter (Signed)
-----   Message from Lorretta Harp, MD sent at 07/04/2019 10:42 AM EST ----- LDL 154 on atorvastatin 20 mg a day.  Not at goal for primary prevention.  Increase to 40 mg a day and recheck in 2 months.

## 2019-07-11 NOTE — Telephone Encounter (Signed)
Called to give lab results. No answer and VM is full. Will send letter.

## 2019-07-12 ENCOUNTER — Telehealth: Payer: Self-pay | Admitting: Cardiovascular Disease

## 2019-07-12 ENCOUNTER — Encounter: Payer: Self-pay | Admitting: Family Medicine

## 2019-07-12 ENCOUNTER — Telehealth: Payer: Self-pay | Admitting: Gastroenterology

## 2019-07-12 ENCOUNTER — Ambulatory Visit (INDEPENDENT_AMBULATORY_CARE_PROVIDER_SITE_OTHER): Payer: BC Managed Care – PPO | Admitting: Family Medicine

## 2019-07-12 ENCOUNTER — Other Ambulatory Visit: Payer: Self-pay

## 2019-07-12 VITALS — Ht 65.0 in | Wt 180.0 lb

## 2019-07-12 DIAGNOSIS — J209 Acute bronchitis, unspecified: Secondary | ICD-10-CM | POA: Diagnosis not present

## 2019-07-12 MED ORDER — DOXYCYCLINE HYCLATE 100 MG PO TABS: 100.0000 mg | ORAL_TABLET | Freq: Two times a day (BID) | ORAL | 0 refills | Status: DC

## 2019-07-12 NOTE — Telephone Encounter (Signed)
Patient aware of lab results and rationale as to why med was increased. Explained LDL goal may be even lower depending on Cor CT results, that is to be scheduled

## 2019-07-12 NOTE — Telephone Encounter (Signed)
Pt c/o medication issue:  1. Name of Medication: atorvastatin (LIPITOR) 40 MG tablet  2. How are you currently taking this medication (dosage and times per day)? Currently taking 20mg  once a day  3. Are you having a reaction (difficulty breathing--STAT)? no  4. What is your medication issue? Patient calling stating she got a new prescription for atorvastatin 40 mg instead of 20 mg. She would like to know why this was increased and if she needs to start taking the new medication or finish her 20mg  tablets first. Please advise.

## 2019-07-12 NOTE — Telephone Encounter (Signed)
Lorretta Harp, MD  07/04/2019 10:42 AM EST    LDL 154 on atorvastatin 20 mg a day. Not at goal for primary prevention. Increase to 40 mg a day and recheck in 2 months.
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
## 2019-07-12 NOTE — Telephone Encounter (Signed)
Attempted to call pt and no answer and voice mail full. Will await further communication from the pt

## 2019-07-14 NOTE — Telephone Encounter (Signed)
I spoke with the pt and she prefers to call back to set up procedure.

## 2019-07-14 NOTE — Telephone Encounter (Signed)
No answer and no voice mail.

## 2019-07-14 NOTE — Telephone Encounter (Signed)
The pt has been advised that she can take PEPCID at bedtime as well as omeprazole.

## 2019-07-14 NOTE — Telephone Encounter (Signed)
Dr Rush Landmark the pt has a question about repeat EGD? I see that he was done in the [facility] but I do not see a recall

## 2019-07-14 NOTE — Telephone Encounter (Signed)
Rebecca Pollard, Thanks for reaching out.  I am not sure where the recall is but I did think it would be reasonable due to the ulcer disease that we found on her stomach to ensure healing.  I would move forward with scheduling her in the [place] at her convenience.  Thanks. GM

## 2019-07-19 ENCOUNTER — Other Ambulatory Visit: Payer: Self-pay | Admitting: *Deleted

## 2019-07-19 DIAGNOSIS — R002 Palpitations: Secondary | ICD-10-CM

## 2019-07-19 DIAGNOSIS — I10 Essential (primary) hypertension: Secondary | ICD-10-CM

## 2019-07-19 NOTE — Progress Notes (Signed)
bmp 

## 2019-07-25 ENCOUNTER — Other Ambulatory Visit: Payer: Self-pay | Admitting: Cardiovascular Disease

## 2019-07-25 DIAGNOSIS — R002 Palpitations: Secondary | ICD-10-CM | POA: Diagnosis not present

## 2019-07-28 ENCOUNTER — Encounter: Payer: Self-pay | Admitting: Cardiovascular Disease

## 2019-07-28 ENCOUNTER — Other Ambulatory Visit: Payer: Self-pay

## 2019-07-28 ENCOUNTER — Ambulatory Visit (INDEPENDENT_AMBULATORY_CARE_PROVIDER_SITE_OTHER): Payer: BC Managed Care – PPO | Admitting: Cardiovascular Disease

## 2019-07-28 VITALS — BP 122/82 | HR 76 | Ht 65.0 in | Wt 186.4 lb

## 2019-07-28 DIAGNOSIS — R002 Palpitations: Secondary | ICD-10-CM | POA: Diagnosis not present

## 2019-07-28 NOTE — Progress Notes (Signed)
Ms Stutzman returns for follow-up.  Her 2-week Zio patch showed no arrhythmias.  Her fasting lipid profile performed 07/03/2019 revealed a total cholesterol 239, LDL of 154 and HDL of 66.  She was put on atorvastatin 40 mg a day and we will recheck a lipid liver profile in 2 months.  She is scheduled to have a coronary CTA in the near future.  I will see her back in 3 months.  Lorretta Harp, M.D., [place], [facility], [place] [address] [place], [place]  [address]  [phone] 07/28/2019 8:35 AM

## 2019-07-28 NOTE — Patient Instructions (Signed)
Medication Instructions:  Your physician recommends that you continue on your current medications as directed. Please refer to the Current Medication list given to you today.  If you need a refill on your cardiac medications before your next appointment, please call your pharmacy.   Lab work: Fasting Lipids and Hepatic Function in 2 months If you have labs (blood work) drawn today and your tests are completely normal, you will receive your results only by: [place] (if you have MyChart) OR A paper copy in the mail If you have any lab test that is abnormal or we need to change your treatment, we will call you to review the results.  Testing/Procedures: NONE  Follow-Up: At [facility], you and your health needs are our priority.  As part of our continuing mission to provide you with exceptional heart care, we have created designated Provider Care Teams.  These Care Teams include your primary Cardiologist (physician) and Advanced Practice Providers (APPs -  Physician Assistants and Nurse Practitioners) who all work together to provide you with the care you need, when you need it. You may see Dr. Gwenlyn Found or one of the following Advanced Practice Providers on your designated Care Team:    Kerin Ransom, PA-C  [place], [place]  Coletta Memos, [place]  Your physician wants you to follow-up in: 3 months with Dr. Gwenlyn Found

## 2019-07-29 ENCOUNTER — Other Ambulatory Visit (HOSPITAL_COMMUNITY)
Admission: RE | Admit: 2019-07-29 | Discharge: 2019-07-29 | Disposition: A | Discharge status: Home / Self Care | Payer: BC Managed Care – PPO | Source: Ambulatory Visit | Attending: Pulmonary Disease | Admitting: Pulmonary Disease

## 2019-07-29 DIAGNOSIS — Z20822 Contact with and (suspected) exposure to covid-19: Secondary | ICD-10-CM | POA: Insufficient documentation

## 2019-07-29 DIAGNOSIS — Z01812 Encounter for preprocedural laboratory examination: Secondary | ICD-10-CM | POA: Diagnosis not present

## 2019-07-29 LAB — SARS CORONAVIRUS 2 (TAT 6-24 HRS): SARS Coronavirus 2: NEGATIVE

## 2019-08-02 ENCOUNTER — Ambulatory Visit (INDEPENDENT_AMBULATORY_CARE_PROVIDER_SITE_OTHER): Payer: BC Managed Care – PPO | Admitting: Pulmonary Disease

## 2019-08-02 ENCOUNTER — Ambulatory Visit: Payer: BC Managed Care – PPO | Admitting: Pulmonary Disease

## 2019-08-02 ENCOUNTER — Other Ambulatory Visit: Payer: Self-pay

## 2019-08-02 ENCOUNTER — Encounter: Payer: Self-pay | Admitting: Pulmonary Disease

## 2019-08-02 VITALS — BP 124/64 | HR 64 | Ht 65.5 in | Wt 184.0 lb

## 2019-08-02 DIAGNOSIS — J301 Allergic rhinitis due to pollen: Secondary | ICD-10-CM | POA: Diagnosis not present

## 2019-08-02 DIAGNOSIS — J4540 Moderate persistent asthma, uncomplicated: Secondary | ICD-10-CM

## 2019-08-02 DIAGNOSIS — R059 Cough, unspecified: Secondary | ICD-10-CM

## 2019-08-02 DIAGNOSIS — R0609 Other forms of dyspnea: Secondary | ICD-10-CM

## 2019-08-02 DIAGNOSIS — G4733 Obstructive sleep apnea (adult) (pediatric): Secondary | ICD-10-CM

## 2019-08-02 DIAGNOSIS — R0600 Dyspnea, unspecified: Secondary | ICD-10-CM

## 2019-08-02 DIAGNOSIS — R05 Cough: Secondary | ICD-10-CM

## 2019-08-02 LAB — PULMONARY FUNCTION TEST
DL/VA % pred: 129 %
DL/VA: 5.43 ml/min/mmHg/L
DLCO unc % pred: 109 %
DLCO unc: 23.56 ml/min/mmHg
FEF 25-75 Post: 4.04 L/sec
FEF 25-75 Pre: 3.21 L/sec
FEF2575-%Change-Post: 25 %
FEF2575-%Pred-Post: 155 %
FEF2575-%Pred-Pre: 123 %
FEV1-%Change-Post: 2 %
FEV1-%Pred-Post: 101 %
FEV1-%Pred-Pre: 98 %
FEV1-Post: 2.85 L
FEV1-Pre: 2.77 L
FEV1FVC-%Change-Post: 4 %
FEV1FVC-%Pred-Pre: 108 %
FEV6-%Change-Post: -1 %
FEV6-%Pred-Post: 92 %
FEV6-%Pred-Pre: 93 %
FEV6-Post: 3.21 L
FEV6-Pre: 3.25 L
FEV6FVC-%Pred-Post: 103 %
FEV6FVC-%Pred-Pre: 103 %
FVC-%Change-Post: -1 %
FVC-%Pred-Post: 89 %
FVC-%Pred-Pre: 90 %
FVC-Post: 3.21 L
FVC-Pre: 3.25 L
Post FEV1/FVC ratio: 89 %
Post FEV6/FVC ratio: 100 %
Pre FEV1/FVC ratio: 85 %
Pre FEV6/FVC Ratio: 100 %
RV % pred: 80 %
RV: 1.6 L
TLC % pred: 92 %
TLC: 4.86 L

## 2019-08-02 MED ORDER — MONTELUKAST SODIUM 10 MG PO TABS: 10.0000 mg | ORAL_TABLET | Freq: Every day | ORAL | 5 refills | Status: DC

## 2019-08-02 NOTE — Progress Notes (Signed)
[place] [facility]  Chief Complaint  Patient presents with  . Follow-up    PFT results, breathing improved since last OV, using albuterol less, cough with thick phlegm    Constitutional:  BP 124/64 (BP Location: Left Arm, Cuff Size: Normal)   Pulse 64   Ht 5' 5.5" (1.664 m)   Wt 184 lb (83.5 kg)   LMP 04/16/2016 (Approximate)   SpO2 98%   BMI 30.15 kg/m   Past Medical History:  Renal cyst, Osteopenia, IBS, HTN, Glaucoma, GERD, Fibromyalgia, Chronic fatigue  Brief Summary:  Rebecca Pollard is a 59 y.o. female with allergic rhinitis and asthma, and obstructive sleep apnea.  She had home sleep study in December.  Plan was to see Dr. Toy Cookey for oral appliance, but this appointment was delayed.  She is being assessed for palpitations and has cardiac CT scheduled.  She has been getting sinus congestion.  Has cough with clear sputum.  Uses albuterol daily and this helps.  Not having wheeze, fever, chest pain, or hemoptysis.  Not having skin rash or leg swelling.  Hb from 06/26/19 was 14.5.  PFT today was normal.  Physical Exam:   Appearance - well kempt   ENMT - no sinus tenderness, no nasal discharge, no oral exudate, 2+ tonsils, deviated septum  Neck - no masses, trachea midline, no thyromegaly, no elevation in JVP  Respiratory - normal appearance of chest wall, normal respiratory effort w/o accessory muscle use, no dullness on percussion, no wheezing or rales  CV - s1s2 regular rate and rhythm, no murmurs, no peripheral edema, radial pulses symmetric  Ext - no cyanosis, clubbing, or joint inflammation noted  Psych - normal mood and affect   Assessment/Plan:   Allergic rhinitis and asthma. - discussed pathophysiology for these conditions - reviewed treatment options - she would like to try montelukast first, and if still having symptoms then consider adding inhaled steroid - continue prn albuterol  Obstructive sleep apnea. - reviewed  her sleep study with her - discussed different treatment options - she would like to optimize allergy therapy first and see if this improves her sleep quality - since her most recent hemoglobin has improved, it likely should be okay to delay therapy for now - if she does decide to do therapy for OSA, then she seems more open to trying CPAP first   Patient Instructions  Montelukast (singulair) 10 mg pill nightly  Follow up in 6 weeks (can be a video or tele visit)  Time spent 33 minutes  Chesley Mires, MD [place] Pager: [phone] 08/02/2019, 10:57 AM  Flow Sheet     Pulmonary tests:  A1AT 01/31/19 >> 141 PFT 08/02/19 >> FEV1 2.85 (101%), FEV1% 89, TLC 4.86 (92%), DLCO 109%, no BD  Serology:  01/31/19 >> ANA positive 1:80 nuclear/speckled pattern; ds DNA, SCL 70, SSA/SSB, SM negative; ACE 32  Sleep tests:  HST 05/23/19 >> AHI 10.5, SpO2 low 86%  Medications:   Allergies as of 08/02/2019      Reactions   Losartan    Decreased kidney fx      Medication List       Accurate as of August 02, 2019 10:57 AM. If you have any questions, ask your nurse or doctor.        STOP taking these medications   omeprazole 40 MG capsule Commonly known as: PRILOSEC Stopped by: Chesley Mires, MD     TAKE these medications   albuterol 108 (90 Base)  MCG/ACT inhaler Commonly known as: VENTOLIN HFA Inhale 1-2 puffs into the lungs every 6 (six) hours as needed for wheezing or shortness of breath.   amLODipine 5 MG tablet Commonly known as: NORVASC Take 1 tablet (5 mg total) by mouth daily.   aspirin EC 81 MG tablet Take 81 mg by mouth every 4 (four) hours as needed.   atorvastatin 40 MG tablet Commonly known as: LIPITOR Take 1 tablet (40 mg total) by mouth daily.   co-enzyme Q-10 30 MG capsule Take 30 mg by mouth 3 (three) times daily.   DHEA PO Take 6 mg by mouth daily.   estradiol 0.1 MG/GM vaginal cream Commonly known as: ESTRACE Place 1  Applicatorful vaginally at bedtime.   metoprolol tartrate 100 MG tablet Commonly known as: LOPRESSOR TAKE 2 HOURS PRIOR TO CT SCAN   montelukast 10 MG tablet Commonly known as: SINGULAIR Take 1 tablet (10 mg total) by mouth at bedtime. Started by: Chesley Mires, MD   multivitamin with minerals tablet Take 1 tablet by mouth daily.   vitamin B-12 100 MCG tablet Commonly known as: CYANOCOBALAMIN Take 100 mcg by mouth daily.   Vitamin C 500 MG Caps Take 2 tablets by mouth 2 (two) times daily.   VITAMIN D PO Take by mouth.       Past Surgical History:  She  has a past surgical history that includes transthoracic echocardiogram (11/2013); ERCP; and Cholecystectomy (1989).  Family History:  Her family history includes Arthritis in her father; Breast cancer in her paternal grandmother; Hearing loss in her father and mother; Leukemia in her maternal grandmother; Stroke in her father.  Social History:  She  reports that she has never smoked. She has never used smokeless tobacco. She reports previous alcohol use. She reports that she does not use drugs.

## 2019-08-02 NOTE — Patient Instructions (Signed)
Montelukast (singulair) 10 mg pill nightly  Follow up in 6 weeks (can be a video or tele visit)

## 2019-08-02 NOTE — Progress Notes (Signed)
Full PFT performed today. °

## 2019-08-07 ENCOUNTER — Telehealth: Payer: Self-pay | Admitting: Pulmonary Disease

## 2019-08-07 DIAGNOSIS — G4733 Obstructive sleep apnea (adult) (pediatric): Secondary | ICD-10-CM

## 2019-08-07 NOTE — Telephone Encounter (Signed)
Trial of Nexuum or Dexilant. If on BID dosing then 40 Nexium twice daily or Dexilant 30 twice daily. Thank you. GM

## 2019-08-07 NOTE — Telephone Encounter (Signed)
Spoke with patient. She stated that she was recently noticed that her LFT test from back in February were slightly elevated. Her LDL was at 154. She was prescribed montelukast at her last visit on 08/02/19 but she hasn't started it yet. She wants to know if it will be safe for her to start this medication.   While on the phone, she also stated that she is now interested in getting a CPAP machine. Advised her I would let Dr. Halford Chessman know so that we can get place an order. She is not currently established with a DME company.   Dr. Halford Chessman. Please advise. Thanks!

## 2019-08-07 NOTE — Telephone Encounter (Signed)
Left message for patient to call back  

## 2019-08-07 NOTE — Telephone Encounter (Signed)
Should be okay to start montelukast.  Please send order to set up auto CPAP machine with pressure range 5 to 15 cm H2O, heated humidity, and mask of choice.

## 2019-08-07 NOTE — Telephone Encounter (Signed)
Dr Rush Landmark the pt would like to know what other PPI she can take, if any, the omeprazole caused a rapid heartbeat.  Please advise.  She is aware that you are out of the office at this time.

## 2019-08-07 NOTE — Telephone Encounter (Signed)
Pt reported that she had to discontinue omeprazole.  She stated that it caused her to have rapid heartbeat.  Pt would like to discuss alternatives.

## 2019-08-07 NOTE — Telephone Encounter (Signed)
Spoke with patient. I explained to her the process of ordering the cpap machine. She verbalized understanding. She wanted to know if a So-Clean machine would be ordered. I explained to her that the insurance will not pay for this but she can pay it from the [employer] or online. She verbalized understanding.   She also wanted to know if her televisit with Beth next Monday needed to be pushed out since she will start the montelukast today. She has been rescheduled for 08/21/19 at 930 as a televisit.   Order for cpap has been ordered. Nothing further needed at time of call.

## 2019-08-08 ENCOUNTER — Other Ambulatory Visit: Payer: Self-pay

## 2019-08-08 MED ORDER — ESOMEPRAZOLE MAGNESIUM 40 MG PO CPDR: 40.0000 mg | DELAYED_RELEASE_CAPSULE | Freq: Two times a day (BID) | ORAL | 6 refills | Status: DC

## 2019-08-08 NOTE — Telephone Encounter (Signed)
The pt has been advised and prescription sent. She will call with any further concerns

## 2019-08-10 ENCOUNTER — Telehealth (HOSPITAL_COMMUNITY): Payer: Self-pay | Admitting: Emergency Medicine

## 2019-08-10 ENCOUNTER — Encounter (HOSPITAL_COMMUNITY): Payer: Self-pay

## 2019-08-10 DIAGNOSIS — Z23 Encounter for immunization: Secondary | ICD-10-CM | POA: Diagnosis not present

## 2019-08-10 NOTE — Telephone Encounter (Signed)
Reaching out to patient to offer assistance regarding upcoming cardiac imaging study; pt verbalizes understanding of appt date/time, parking situation and where to check in, pre-test NPO status and medications ordered, and verified current allergies; name and call back number provided for further questions should they arise Laquan Ludden RN Navigator Cardiac Imaging [facility] [phone] office [phone] cell 

## 2019-08-11 ENCOUNTER — Ambulatory Visit (HOSPITAL_COMMUNITY)
Admission: RE | Admit: 2019-08-11 | Discharge: 2019-08-11 | Disposition: A | Discharge status: Home / Self Care | Payer: BC Managed Care – PPO | Source: Ambulatory Visit | Attending: Cardiovascular Disease | Admitting: Cardiovascular Disease

## 2019-08-11 ENCOUNTER — Other Ambulatory Visit: Payer: Self-pay

## 2019-08-11 ENCOUNTER — Encounter (HOSPITAL_COMMUNITY): Payer: Self-pay

## 2019-08-11 DIAGNOSIS — R0789 Other chest pain: Secondary | ICD-10-CM | POA: Diagnosis not present

## 2019-08-11 LAB — POCT I-STAT CREATININE: Creatinine, Ser: 0.9 mg/dL (ref 0.44–1.00)

## 2019-08-11 IMAGING — CT CT HEART MORP W/ CTA COR W/ SCORE W/ CA W/CM &/OR W/O CM
4 of 7 series · 8 of 20 positions shown, 9 images · IV contrast (APPLIED)
Comparison: Chest radiograph 11/11/2018.  No prior CT.
COMPARISON: Chest radiograph 11/11/2018.  No prior CT.

Addendum:
EXAM:
OVER-READ INTERPRETATION  CT CHEST

The following report is an over-read performed by radiologist Dr.
Arijit Juma [REDACTED] on 08/11/2019. This over-read
does not include interpretation of cardiac or coronary anatomy or
pathology. The coronary CTA interpretation by the cardiologist is
attached.
CLINICAL DATA: Chest pain
Cardiac CTA
MEDICATIONS:
Sub lingual nitro. 4mg x 2
TECHNIQUE: The patient was scanned on a Siemens [REDACTED]ice scanner. Gantry
rotation speed was 250 msecs. Collimation was 0.6 mm. A 100 kV
prospective scan was triggered in the ascending thoracic aorta at
35-75% of the R-R interval. Average HR during the scan was 60 bpm.
The 3D data set was interpreted on a dedicated work station using
MPR, MIP and VRT modes. A total of 80cc of contrast was used.

[Series 6: best diast 73 % · axial · 0.39mm/px · z∈[-167,-128]mm · 2 of 288 slices shown]
[im 96/288  vessel]
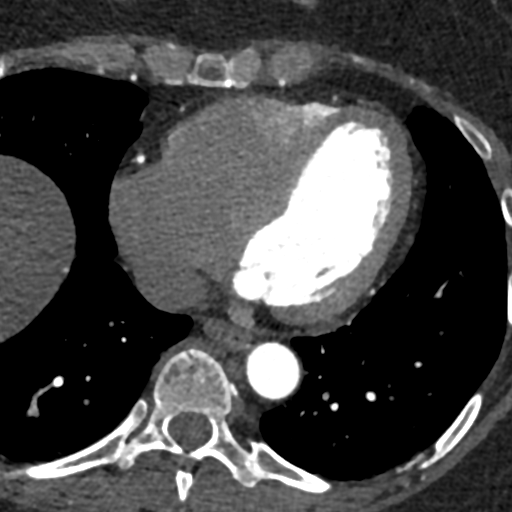
[im 192/288  vessel]
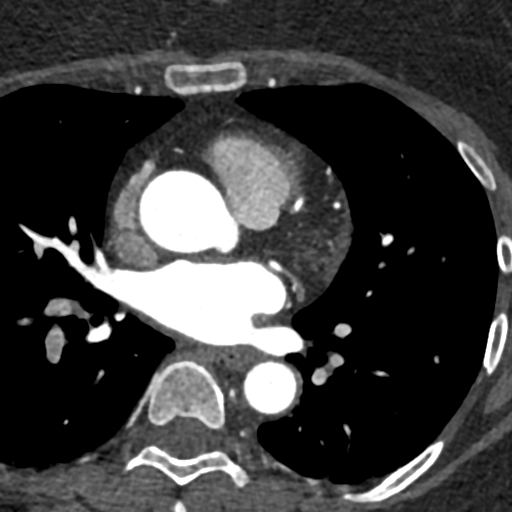

[Series 8: best syst · axial · 0.39mm/px · z∈[-167,-128]mm · 2 of 288 slices shown, 3 images]
[im 96/288  vessel]
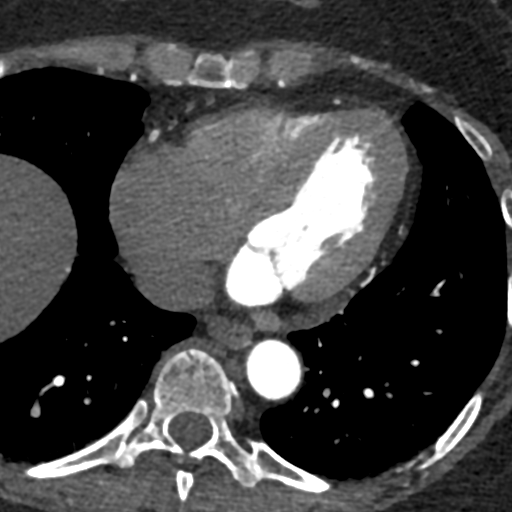
[im 96/288  lung]
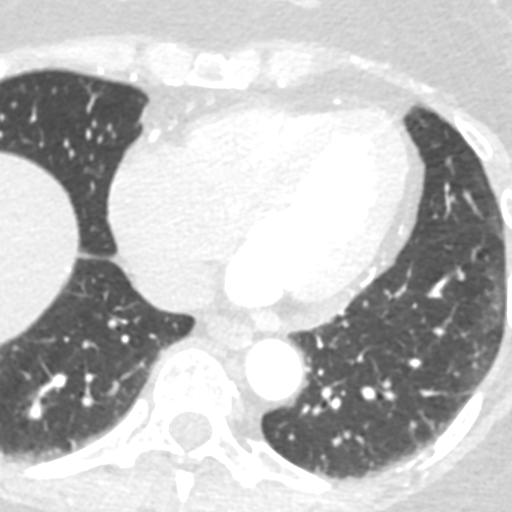
[im 192/288  vessel]
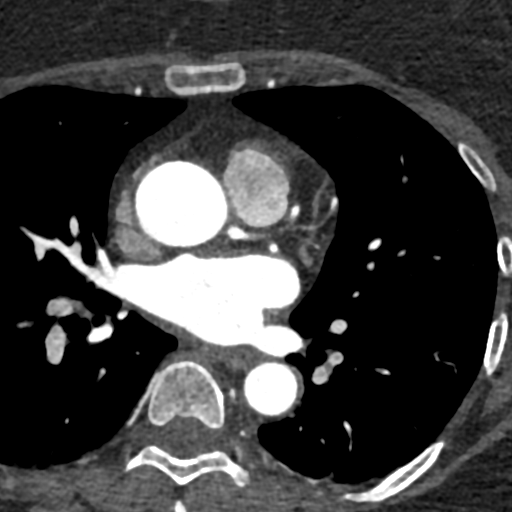

[Series 9: ts diast sharp · axial · 0.39mm/px · z∈[-167,-128]mm · 2 of 288 slices shown]
[im 96/288  lung]
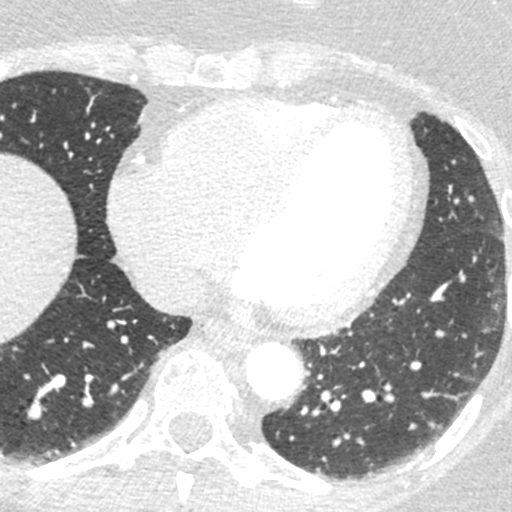
[im 192/288  lung]
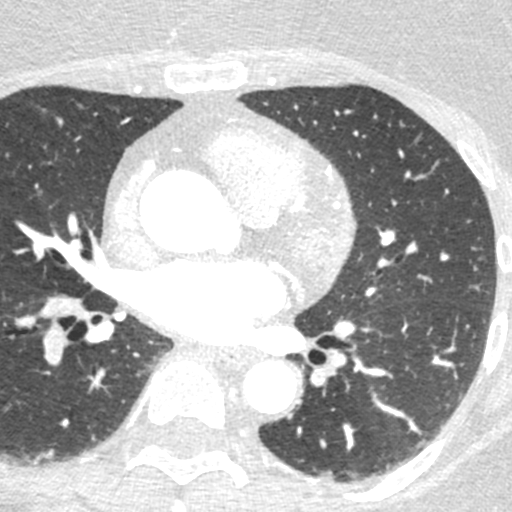

[Series 10: ts syst sharp · axial · 0.39mm/px · z∈[-167,-128]mm · 2 of 288 slices shown]
[im 96/288  lung]
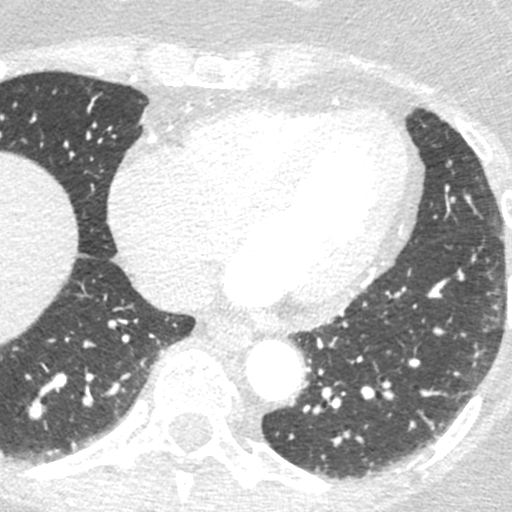
[im 192/288  lung]
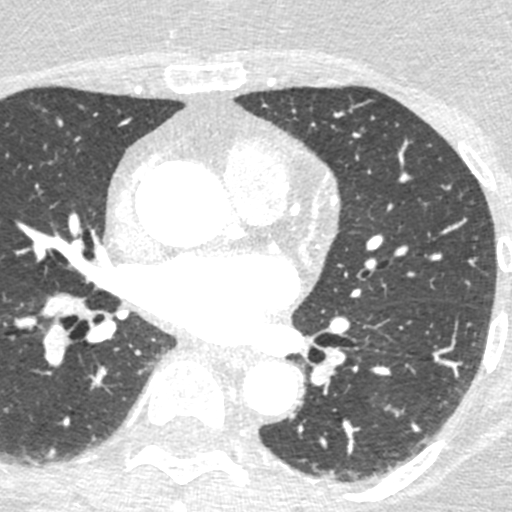

[8 of 20 positions shown; findings below may reference images not displayed]

FINDINGS: Vascular: Aortic atherosclerosis. Tortuous thoracic aorta. No
dissection. No central pulmonary embolism, on this non-dedicated
study.

Mediastinum/Nodes: No imaged thoracic adenopathy.

Lungs/Pleura: No pleural fluid. Left upper lobe/lingular pulmonary
nodules on the order of 3-4 mm are identified on series 12.

Upper Abdomen: Normal imaged portions of the liver, spleen, stomach.

Musculoskeletal: Midthoracic spondylosis.
IMPRESSION: 1.  No acute findings in the imaged extracardiac chest.
2.  Aortic Atherosclerosis (A0627-TLW.W).
3. Multiple lingular/left upper lobe pulmonary nodules on the order
of 3-4 mm. No follow-up needed if patient is low-risk. Non-contrast
chest CT can be considered in 12 months if patient is high-risk.
This recommendation follows the consensus statement: Guidelines for
Management of Incidental Pulmonary Nodules Detected on CT Images:
FINDINGS: Non-cardiac: See separate report from [REDACTED].

Pulmonary veins drain normally to the left atrium.

Calcium Score: 0 Agatston units.

Coronary Arteries: Right dominant with no anomalies

LM: No plaque or stenosis.

LAD system: No plaque or stenosis.

Circumflex system: No plaque or stenosis.

RCA system: Soft plaque mid RCA, possible modreate (51-69%)
stenosis.
IMPRESSION: 1. Coronary calcium score 0 Agatston units. This suggests low risk
for future cardiac events.

2. There is narrowing in the mid-RCA, possible moderate stenosis. No
calcification at the site of narrowing, possible soft plaque. Will
send for FFR.

Juan Fernando Hammett

*** End of Addendum ***
EXAM:
OVER-READ INTERPRETATION  CT CHEST

The following report is an over-read performed by radiologist Dr.
Arijit Juma [REDACTED] on 08/11/2019. This over-read
does not include interpretation of cardiac or coronary anatomy or
pathology. The coronary CTA interpretation by the cardiologist is
attached.
FINDINGS: Vascular: Aortic atherosclerosis. Tortuous thoracic aorta. No
dissection. No central pulmonary embolism, on this non-dedicated
study.

Mediastinum/Nodes: No imaged thoracic adenopathy.

Lungs/Pleura: No pleural fluid. Left upper lobe/lingular pulmonary
nodules on the order of 3-4 mm are identified on series 12.

Upper Abdomen: Normal imaged portions of the liver, spleen, stomach.

Musculoskeletal: Midthoracic spondylosis.
IMPRESSION: 1.  No acute findings in the imaged extracardiac chest.
2.  Aortic Atherosclerosis (A0627-TLW.W).
3. Multiple lingular/left upper lobe pulmonary nodules on the order
of 3-4 mm. No follow-up needed if patient is low-risk. Non-contrast
chest CT can be considered in 12 months if patient is high-risk.
This recommendation follows the consensus statement: Guidelines for
Management of Incidental Pulmonary Nodules Detected on CT Images:

## 2019-08-11 MED ORDER — IOHEXOL 350 MG/ML SOLN
80.0000 mL | Freq: Once | INTRAVENOUS | Status: AC | PRN
  Administered 2019-08-11: 80 mL via INTRAVENOUS

## 2019-08-11 MED ORDER — NITROGLYCERIN 0.4 MG SL SUBL
SUBLINGUAL_TABLET | SUBLINGUAL | Status: AC
  Filled 2019-08-11: qty 2

## 2019-08-11 MED ORDER — NITROGLYCERIN 0.4 MG SL SUBL
0.8000 mg | SUBLINGUAL_TABLET | Freq: Once | SUBLINGUAL | Status: AC
  Administered 2019-08-11: 0.8 mg via SUBLINGUAL

## 2019-08-14 ENCOUNTER — Ambulatory Visit: Payer: BC Managed Care – PPO | Admitting: Primary Care

## 2019-08-14 ENCOUNTER — Telehealth: Payer: Self-pay | Admitting: Cardiovascular Disease

## 2019-08-14 ENCOUNTER — Other Ambulatory Visit: Payer: Self-pay

## 2019-08-14 ENCOUNTER — Ambulatory Visit (HOSPITAL_COMMUNITY)
Admission: RE | Admit: 2019-08-14 | Discharge: 2019-08-14 | Disposition: A | Discharge status: Home / Self Care | Payer: BC Managed Care – PPO | Source: Ambulatory Visit | Attending: Cardiovascular Disease | Admitting: Cardiovascular Disease

## 2019-08-14 DIAGNOSIS — R0789 Other chest pain: Secondary | ICD-10-CM | POA: Diagnosis not present

## 2019-08-14 DIAGNOSIS — D751 Secondary polycythemia: Secondary | ICD-10-CM

## 2019-08-14 NOTE — Telephone Encounter (Signed)
Patient has another question for Malachy Mood in regards to her CT results.

## 2019-08-14 NOTE — Telephone Encounter (Signed)
Spoke to patient coronary ct and monitor results given.

## 2019-08-16 NOTE — Progress Notes (Signed)
Patient Care Team: Ma Hillock, DO as PCP - General (Family Medicine) Michel Bickers, MD as Consulting Physician (Infectious Diseases) Nicholas Lose, MD as Consulting Physician (Hematology and Oncology) Mansouraty, Telford Nab., MD as Consulting Physician (Gastroenterology) Chesley Mires, MD as Consulting Physician (Pulmonary Disease)  DIAGNOSIS:    ICD-10-CM   1. Erythrocytosis  D75.1     CHIEF COMPLIANT: Follow-up of secondary erythrocytosis  INTERVAL HISTORY: Rebecca Pollard is a 59 y.o. with above-mentioned history of secondary erythrocytosis. She presents to the clinic today to review her labs.  She is given blood transfusion once at the 1 blood donation can.  She felt remarkably well after the blood donation.  She even donated plasma.  She is asking to find out how often does she need to do blood donations.  Also she is getting ready to be set up for CPAP machine for obstructive sleep apnea.  ALLERGIES:  is allergic to losartan.  MEDICATIONS:  Current Outpatient Medications  Medication Sig Dispense Refill  . albuterol (PROVENTIL HFA;VENTOLIN HFA) 108 (90 Base) MCG/ACT inhaler Inhale 1-2 puffs into the lungs every 6 (six) hours as needed for wheezing or shortness of breath. 1 Inhaler 0  . amLODipine (NORVASC) 5 MG tablet Take 1 tablet (5 mg total) by mouth daily. 90 tablet 1  . Ascorbic Acid (VITAMIN C) 500 MG CAPS Take 2 tablets by mouth 2 (two) times daily.    Marland Kitchen aspirin EC 81 MG tablet Take 81 mg by mouth every 4 (four) hours as needed.     Marland Kitchen atorvastatin (LIPITOR) 40 MG tablet Take 1 tablet (40 mg total) by mouth daily. 90 tablet 3  . co-enzyme Q-10 30 MG capsule Take 30 mg by mouth 3 (three) times daily.    Marland Kitchen esomeprazole (NEXIUM) 40 MG capsule Take 1 capsule (40 mg total) by mouth 2 (two) times daily before a meal. 60 capsule 6  . estradiol (ESTRACE) 0.1 MG/GM vaginal cream Place 1 Applicatorful vaginally at bedtime.    . metoprolol tartrate (LOPRESSOR) 100 MG tablet TAKE 2  HOURS PRIOR TO CT SCAN 1 tablet 0  . montelukast (SINGULAIR) 10 MG tablet Take 1 tablet (10 mg total) by mouth at bedtime. 30 tablet 5  . Multiple Vitamins-Minerals (MULTIVITAMIN WITH MINERALS) tablet Take 1 tablet by mouth daily.    . Nutritional Supplements (DHEA PO) Take 6 mg by mouth daily.    . vitamin B-12 (CYANOCOBALAMIN) 100 MCG tablet Take 100 mcg by mouth daily.    Marland Kitchen VITAMIN D PO Take by mouth.     No current facility-administered medications for this visit.    PHYSICAL EXAMINATION: ECOG PERFORMANCE STATUS: 1 - Symptomatic but completely ambulatory  Vitals:   08/17/19 1130  BP: (!) 131/91  Pulse: 66  Resp: 18  Temp: 98.3 F (36.8 C)  SpO2: 100%   Filed Weights   08/17/19 1130  Weight: 181 lb 12.8 oz (82.5 kg)    LABORATORY DATA:  I have reviewed the data as listed CMP Latest Ref Rng & Units 08/11/2019 07/03/2019 03/01/2019  Glucose 70 - 99 mg/dL - - 89  BUN 6 - 23 mg/dL - - 11  Creatinine 0.44 - 1.00 mg/dL 0.90 - 0.90  Sodium 135 - 145 mEq/L - - 141  Potassium 3.5 - 5.1 mEq/L - - 3.9  Chloride 96 - 112 mEq/L - - 105  CO2 19 - 32 mEq/L - - 28  Calcium 8.4 - 10.5 mg/dL - - 9.4  Total Protein 6.0 -  8.5 g/dL - 7.4 -  Total Bilirubin 0.0 - 1.2 mg/dL - 0.3 -  Alkaline Phos 39 - 117 IU/L - 121(H) -  AST 0 - 40 IU/L - 23 -  ALT 0 - 32 IU/L - 19 -    Lab Results  Component Value Date   WBC 7.3 08/17/2019   HGB 15.7 (H) 08/17/2019   HCT 48.3 (H) 08/17/2019   MCV 92.4 08/17/2019   PLT 286 08/17/2019   NEUTROABS 5.1 08/17/2019    ASSESSMENT & PLAN:  Erythrocytosis Mild erythrocytosis: 02/28/2019: Hemoglobin 15.6, hematocrit 46.8 rest of the CBC normal MPN panel: No JAK2 mutation detected (MPL and CAL R Neg)  Diagnosis: Secondary erythrocytosis.  Possible secondary to obstructive sleep apnea.  Lab review: Hemoglobin is up to 15.7 (it went down to 12.3 after she donated blood) I recommended that she donate blood once every 3 months for the next couple of  visits. If her sleep apnea gets better then her hemoglobin may naturally come down on its own.    Return to clinic in 6 months with labs and follow-up to discuss additional phlebotomies versus observation.  No orders of the defined types were placed in this encounter.  The patient has a good understanding of the overall plan. she agrees with it. she will call with any problems that may develop before the next visit here.  Total time spent: 20 mins including face to face time and time spent for planning, charting and coordination of care  Nicholas Lose, MD 08/17/2019  I, Cloyde Reams Dorshimer, am acting as scribe for Dr. Nicholas Lose.  I have reviewed the above documentation for accuracy and completeness, and I agree with the above.

## 2019-08-17 ENCOUNTER — Inpatient Hospital Stay: Payer: BC Managed Care – PPO | Attending: Hematology and Oncology | Admitting: Hematology and Oncology

## 2019-08-17 ENCOUNTER — Other Ambulatory Visit: Payer: Self-pay

## 2019-08-17 ENCOUNTER — Inpatient Hospital Stay: Payer: BC Managed Care – PPO

## 2019-08-17 DIAGNOSIS — D751 Secondary polycythemia: Secondary | ICD-10-CM

## 2019-08-17 DIAGNOSIS — Z79899 Other long term (current) drug therapy: Secondary | ICD-10-CM | POA: Diagnosis not present

## 2019-08-17 DIAGNOSIS — Z7982 Long term (current) use of aspirin: Secondary | ICD-10-CM | POA: Insufficient documentation

## 2019-08-17 DIAGNOSIS — G4733 Obstructive sleep apnea (adult) (pediatric): Secondary | ICD-10-CM | POA: Insufficient documentation

## 2019-08-17 LAB — CBC WITH DIFFERENTIAL (CANCER CENTER ONLY)
Abs Immature Granulocytes: 0.01 10*3/uL (ref 0.00–0.07)
Basophils Absolute: 0.1 10*3/uL (ref 0.0–0.1)
Basophils Relative: 1 %
Eosinophils Absolute: 0.1 10*3/uL (ref 0.0–0.5)
Eosinophils Relative: 1 %
HCT: 48.3 % — ABNORMAL HIGH (ref 36.0–46.0)
Hemoglobin: 15.7 g/dL — ABNORMAL HIGH (ref 12.0–15.0)
Immature Granulocytes: 0 %
Lymphocytes Relative: 21 %
Lymphs Abs: 1.5 10*3/uL (ref 0.7–4.0)
MCH: 30 pg (ref 26.0–34.0)
MCHC: 32.5 g/dL (ref 30.0–36.0)
MCV: 92.4 fL (ref 80.0–100.0)
Monocytes Absolute: 0.5 10*3/uL (ref 0.1–1.0)
Monocytes Relative: 7 %
Neutro Abs: 5.1 10*3/uL (ref 1.7–7.7)
Neutrophils Relative %: 70 %
Platelet Count: 286 10*3/uL (ref 150–400)
RBC: 5.23 MIL/uL — ABNORMAL HIGH (ref 3.87–5.11)
RDW: 13.7 % (ref 11.5–15.5)
WBC Count: 7.3 10*3/uL (ref 4.0–10.5)
nRBC: 0 % (ref 0.0–0.2)

## 2019-08-17 LAB — FERRITIN: Ferritin: 13 ng/mL (ref 11–307)

## 2019-08-17 NOTE — Assessment & Plan Note (Signed)
Mild erythrocytosis: 02/28/2019: Hemoglobin 15.6, hematocrit 46.8 rest of the CBC normal MPN panel: No JAK2 mutation detected (MPL and CAL R Neg)  Diagnosis: Secondary erythrocytosis.  Possible differential secondary to obstructive sleep apnea versus another hypoxia generating cause.  Lab review:  I discussed with her about blood donation at [facility]. Based on stable blood work she could be seen on an as-needed basis

## 2019-08-18 ENCOUNTER — Telehealth: Payer: Self-pay | Admitting: Hematology and Oncology

## 2019-08-18 LAB — ERYTHROPOIETIN: Erythropoietin: 11.3 m[IU]/mL (ref 2.6–18.5)

## 2019-08-18 NOTE — Telephone Encounter (Signed)
Scheduled per 03/25 los, patient has been called and notified. 

## 2019-08-21 ENCOUNTER — Other Ambulatory Visit: Payer: Self-pay

## 2019-08-21 ENCOUNTER — Encounter: Payer: Self-pay | Admitting: Primary Care

## 2019-08-21 ENCOUNTER — Ambulatory Visit (INDEPENDENT_AMBULATORY_CARE_PROVIDER_SITE_OTHER): Payer: BC Managed Care – PPO | Admitting: Primary Care

## 2019-08-21 DIAGNOSIS — J3089 Other allergic rhinitis: Secondary | ICD-10-CM

## 2019-08-21 DIAGNOSIS — J45909 Unspecified asthma, uncomplicated: Secondary | ICD-10-CM

## 2019-08-21 DIAGNOSIS — G4733 Obstructive sleep apnea (adult) (pediatric): Secondary | ICD-10-CM

## 2019-08-21 NOTE — Patient Instructions (Signed)
Allergic rhinitis and asthma: - Continue montelukast 10mg  daily    Mild OSA: - Encouraged weight loss and side sleeping position - Avoid sedating medication or alcohol in excess prior to bedtime   Follow Up Instructions:   3-6 months with Dr. Halford Chessman    Sleep Apnea Sleep apnea affects breathing during sleep. It causes breathing to stop for a short time or to become shallow. It can also increase the risk of:  Heart attack.  Stroke.  Being very overweight (obese).  Diabetes.  Heart failure.  Irregular heartbeat. The goal of treatment is to help you breathe normally again. What are the causes? There are three kinds of sleep apnea:  Obstructive sleep apnea. This is caused by a blocked or collapsed airway.  Central sleep apnea. This happens when the brain does not send the right signals to the muscles that control breathing.  Mixed sleep apnea. This is a combination of obstructive and central sleep apnea. The most common cause of this condition is a collapsed or blocked airway. This can happen if:  Your throat muscles are too relaxed.  Your tongue and tonsils are too large.  You are overweight.  Your airway is too small. What increases the risk?  Being overweight.  Smoking.  Having a small airway.  Being older.  Being female.  Drinking alcohol.  Taking medicines to calm yourself (sedatives or tranquilizers).  Having family members with the condition. What are the signs or symptoms?  Trouble staying asleep.  Being sleepy or tired during the day.  Getting angry a lot.  Loud snoring.  Headaches in the morning.  Not being able to focus your mind (concentrate).  Forgetting things.  Less interest in sex.  Mood swings.  Personality changes.  Feelings of sadness (depression).  Waking up a lot during the night to pee (urinate).  Dry mouth.  Sore throat. How is this diagnosed?  Your medical history.  A physical exam.  A test that is done  when you are sleeping (sleep study). The test is most often done in a sleep lab but may also be done at home. How is this treated?   Sleeping on your side.  Using a medicine to get rid of mucus in your nose (decongestant).  Avoiding the use of alcohol, medicines to help you relax, or certain pain medicines (narcotics).  Losing weight, if needed.  Changing your diet.  Not smoking.  Using a machine to open your airway while you sleep, such as: ? An oral appliance. This is a mouthpiece that shifts your lower jaw forward. ? A CPAP device. This device blows air through a mask when you breathe out (exhale). ? An EPAP device. This has valves that you put in each nostril. ? A BPAP device. This device blows air through a mask when you breathe in (inhale) and breathe out.  Having surgery if other treatments do not work. It is important to get treatment for sleep apnea. Without treatment, it can lead to:  High blood pressure.  Coronary artery disease.  In men, not being able to have an erection (impotence).  Reduced thinking ability. Follow these instructions at home: Lifestyle  Make changes that your doctor recommends.  Eat a healthy diet.  Lose weight if needed.  Avoid alcohol, medicines to help you relax, and some pain medicines.  Do not use any products that contain nicotine or tobacco, such as cigarettes, e-cigarettes, and chewing tobacco. If you need help quitting, ask your doctor. General instructions  Take over-the-counter and prescription medicines only as told by your doctor.  If you were given a machine to use while you sleep, use it only as told by your doctor.  If you are having surgery, make sure to tell your doctor you have sleep apnea. You may need to bring your device with you.  Keep all follow-up visits as told by your doctor. This is important. Contact a doctor if:  The machine that you were given to use during sleep bothers you or does not seem to be  working.  You do not get better.  You get worse. Get help right away if:  Your chest hurts.  You have trouble breathing in enough air.  You have an uncomfortable feeling in your back, arms, or stomach.  You have trouble talking.  One side of your body feels weak.  A part of your face is hanging down. These symptoms may be an emergency. Do not wait to see if the symptoms will go away. Get medical help right away. Call your local emergency services (911 in the [place] Do not drive yourself to the hospital. Summary  This condition affects breathing during sleep.  The most common cause is a collapsed or blocked airway.  The goal of treatment is to help you breathe normally while you sleep. This information is not intended to replace advice given to you by your health care provider. Make sure you discuss any questions you have with your health care provider. Document Revised: 02/25/2018 Document Reviewed: 01/04/2018 Elsevier Patient Education  [place].

## 2019-08-21 NOTE — Progress Notes (Signed)
Virtual Visit via Telephone Note  I connected with Rebecca Pollard on 08/21/19 at  9:30 AM EDT by telephone and verified that I am speaking with the correct person using two identifiers.  Location: Patient: Home Provider: Office   I discussed the limitations, risks, security and privacy concerns of performing an evaluation and management service by telephone and the availability of in person appointments. I also discussed with the patient that there may be a patient responsible charge related to this service. The patient expressed understanding and agreed to proceed.   History of Present Illness: 59 year old female.  Past medical history significant for allergic rhinitis and obstructive sleep apnea.  Patient of Dr. Halford Chessman, last seen August 02, 2019.  She was started on montelukast.  If still having symptoms we will then consider adding inhaled steroid.  Continue as needed albuterol.  Sleep study reviewed with Dr. Halford Chessman, patient would like to optimize allergy therapy first and see if this improves her sleep quality.  If she does deceived to proceed with therapy for OSA she seems more open to trying CPAP first.  Patient advised to follow-up in 6 weeks.    08/21/2019 Patient contacted today for an acute visit for medication management/OSA. She reports sleeping and breathing better. She is getting 10 hours of rest and able to take full deep breath. Allergy symptoms have been well controlled recently since starting Singulair. She will be seeing her dentist soon to see if she would be a candidate for an oral appliance. Denies shortness of breath or wheezing.   Observations/Objective:  - Patient appears well; no obvert shortness of breath/wheezing or cough  HST 05/23/19 >> AHI 10.5, SpO2 low 86%  PFT 08/02/19 >> FEV1 2.85 (101%), FEV1% 89, TLC 4.86 (92%), DLCO 109%, no BD  Assessment and Plan:  Allergic rhinitis and asthma: - Symptoms significantly improved with montelukast  - Stopped albuterol d/t  side effects  Mild OSA: - Sleeping well at night, she is looking into oral appliance - Encouraged weight loss and side sleeping position - Advised patient not to take sedating medication or drink alcohol in excess prior to bedtime   Follow Up Instructions:   3-6 months with Dr. Halford Chessman  I discussed the assessment and treatment plan with the patient. The patient was provided an opportunity to ask questions and all were answered. The patient agreed with the plan and demonstrated an understanding of the instructions.   The patient was advised to call back or seek an in-person evaluation if the symptoms worsen or if the condition fails to improve as anticipated.  I provided 18 minutes of non-face-to-face time during this encounter.   Martyn Ehrich, NP

## 2019-08-28 NOTE — Progress Notes (Signed)
Reviewed and agree with assessment/plan.   Dillon Livermore, MD [facility] 05/20/2016, 12:24 PM Pager:  [phone]  

## 2019-08-29 ENCOUNTER — Other Ambulatory Visit: Payer: BC Managed Care – PPO

## 2019-08-30 ENCOUNTER — Other Ambulatory Visit: Payer: Self-pay

## 2019-08-31 ENCOUNTER — Encounter: Payer: Self-pay | Admitting: Obstetrics & Gynecology

## 2019-08-31 ENCOUNTER — Ambulatory Visit (INDEPENDENT_AMBULATORY_CARE_PROVIDER_SITE_OTHER): Payer: BC Managed Care – PPO | Admitting: Obstetrics & Gynecology

## 2019-08-31 VITALS — BP 120/80 | Ht 64.75 in | Wt 182.8 lb

## 2019-08-31 DIAGNOSIS — Z01419 Encounter for gynecological examination (general) (routine) without abnormal findings: Secondary | ICD-10-CM

## 2019-08-31 DIAGNOSIS — N952 Postmenopausal atrophic vaginitis: Secondary | ICD-10-CM

## 2019-08-31 DIAGNOSIS — M8589 Other specified disorders of bone density and structure, multiple sites: Secondary | ICD-10-CM

## 2019-08-31 DIAGNOSIS — Z780 Asymptomatic menopausal state: Secondary | ICD-10-CM | POA: Diagnosis not present

## 2019-08-31 DIAGNOSIS — Z6830 Body mass index (BMI) 30.0-30.9, adult: Secondary | ICD-10-CM

## 2019-08-31 DIAGNOSIS — E6609 Other obesity due to excess calories: Secondary | ICD-10-CM

## 2019-08-31 MED ORDER — ESTRADIOL 10 MCG VA TABS: 1.0000 | ORAL_TABLET | VAGINAL | 4 refills | Status: DC

## 2019-08-31 NOTE — Progress Notes (Signed)
Rebecca Pollard 1960-07-30 [id number]   History:    59 y.o. G3P3L3 Married.  RP:  Established patient presenting for annual gyn exam   HPI: Menopause, well on no HRT x 6 months when patient stopped her Combipatch.  No PMB x 05/2017, Endometrial line thin at 3.7 mm in 07/2017.  No pelvic pain.  Rarely sexually active, stopped Estradiol cream as well.  Urine/BMs wnl.  Breasts wnl.  Screening Mammo 03/2019 Negative.  Health labs with Fam MD.  Treated for hyperlipidemia and cHTN.  New Dx of Polycythemia.   Past medical history,surgical history, family history and social history were all reviewed and documented in the EPIC chart.  Gynecologic History Patient's last menstrual period was 04/16/2016 (approximate). Obstetric History OB History  Gravida Para Term Preterm AB Living  3 3       3   SAB TAB Ectopic Multiple Live Births               # Outcome Date GA Lbr Len/2nd Weight Sex Delivery Anes PTL Lv  3 Para           2 Para           1 Para              ROS: A ROS was performed and pertinent positives and negatives are included in the history.  GENERAL: No fevers or chills. HEENT: No change in vision, no earache, sore throat or sinus congestion. NECK: No pain or stiffness. CARDIOVASCULAR: No chest pain or pressure. No palpitations. PULMONARY: No shortness of breath, cough or wheeze. GASTROINTESTINAL: No abdominal pain, nausea, vomiting or diarrhea, melena or bright red blood per rectum. GENITOURINARY: No urinary frequency, urgency, hesitancy or dysuria. MUSCULOSKELETAL: No joint or muscle pain, no back pain, no recent trauma. DERMATOLOGIC: No rash, no itching, no lesions. ENDOCRINE: No polyuria, polydipsia, no heat or cold intolerance. No recent change in weight. HEMATOLOGICAL: No anemia or easy bruising or bleeding. NEUROLOGIC: No headache, seizures, numbness, tingling or weakness. PSYCHIATRIC: No depression, no loss of interest in normal activity or change in sleep pattern.      Exam:   BP 120/80   Ht 5' 4.75" (1.645 m)   Wt 182 lb 12.8 oz (82.9 kg)   LMP 04/16/2016 (Approximate)   BMI 30.65 kg/m   Body mass index is 30.65 kg/m.  General appearance : Well developed well nourished female. No acute distress HEENT: Eyes: no retinal hemorrhage or exudates,  Neck supple, trachea midline, no carotid bruits, no thyroidmegaly Lungs: Clear to auscultation, no rhonchi or wheezes, or rib retractions  Heart: Regular rate and rhythm, no murmurs or gallops Breast:Examined in sitting and supine position were symmetrical in appearance, no palpable masses or tenderness,  no skin retraction, no nipple inversion, no nipple discharge, no skin discoloration, no axillary or supraclavicular lymphadenopathy Abdomen: no palpable masses or tenderness, no rebound or guarding Extremities: no edema or skin discoloration or tenderness  Pelvic: Vulva: Normal             Vagina: No gross lesions or discharge  Cervix: No gross lesions or discharge.  Pap reflex done.  Uterus  AV, normal size, shape and consistency, non-tender and mobile  Adnexa  Without masses or tenderness  Anus: Normal   Assessment/Plan:  59 y.o. female for annual exam   1. Encounter for routine gynecological examination with Papanicolaou smear of cervix Normal gynecologic exam.  Pap reflex done.  Breast exam normal.  Screening mammogram November 2020  was negative.  Health labs with family physician.  2. Postmenopausal Well on no hormone replacement therapy for 6 months.  No postmenopausal bleeding.  3. Postmenopausal atrophic vaginitis Dryness with intercourse.  Will start on estradiol tablets once to twice a week.  Usage reviewed and prescription sent to pharmacy.  4. Osteopenia of multiple sites Vitamin D supplements, calcium intake of 1200 mg daily and regular weightbearing physical activities.  Schedule bone density here now. - DG Bone Density; Future  5. Class 1 obesity due to excess calories with  serious comorbidity and body mass index (BMI) of 30.0 to 30.9 in adult Recommend a lower calorie/carb diet such as [employer].  Aerobic activities 5 times a week and light weightlifting every 2 days.  Other orders - Estradiol 10 MCG TABS vaginal tablet; Place 1 tablet (10 mcg total) vaginally 2 (two) times a week. Recommend once weekly, will increase to twice weekly if needed.  Princess Bruins MD, 8:10 AM 08/31/2019

## 2019-09-01 LAB — PAP IG W/ RFLX HPV ASCU

## 2019-09-06 ENCOUNTER — Encounter: Payer: Self-pay | Admitting: Obstetrics & Gynecology

## 2019-09-06 NOTE — Patient Instructions (Signed)
1. Encounter for routine gynecological examination with Papanicolaou smear of cervix Normal gynecologic exam.  Pap reflex done.  Breast exam normal.  Screening mammogram November 2020 was negative.  Health labs with family physician.  2. Postmenopausal Well on no hormone replacement therapy for 6 months.  No postmenopausal bleeding.  3. Postmenopausal atrophic vaginitis Dryness with intercourse.  Will start on estradiol tablets once to twice a week.  Usage reviewed and prescription sent to pharmacy.  4. Osteopenia of multiple sites Vitamin D supplements, calcium intake of 1200 mg daily and regular weightbearing physical activities.  Schedule bone density here now. - DG Bone Density; Future  5. Class 1 obesity due to excess calories with serious comorbidity and body mass index (BMI) of 30.0 to 30.9 in adult Recommend a lower calorie/carb diet such as [employer].  Aerobic activities 5 times a week and light weightlifting every 2 days.  Other orders - Estradiol 10 MCG TABS vaginal tablet; Place 1 tablet (10 mcg total) vaginally 2 (two) times a week. Recommend once weekly, will increase to twice weekly if needed.  Mirza, it was a pleasure seeing you today!  I will inform you of your results as soon as they are available.

## 2019-09-08 DIAGNOSIS — Z23 Encounter for immunization: Secondary | ICD-10-CM | POA: Diagnosis not present

## 2019-09-14 ENCOUNTER — Other Ambulatory Visit: Payer: BC Managed Care – PPO

## 2019-09-14 ENCOUNTER — Ambulatory Visit: Payer: BC Managed Care – PPO | Admitting: Hematology and Oncology

## 2019-09-18 ENCOUNTER — Telehealth: Payer: Self-pay | Admitting: Pulmonary Disease

## 2019-09-18 NOTE — Telephone Encounter (Signed)
09/18/2019  Received letter from Dr. Corky Sing office notifying that they have attempted to reach the patient multiple times.  They are unable to contact the patient directly to schedule an appointment for oral appliance.   Will route to EW NP and Dr. Halford Chessman as fyi as they last saw the patient.   Wyn Quaker FNP

## 2019-09-18 NOTE — Telephone Encounter (Signed)
Thank you, she has a follow-up scheduled in September. Can discuss further then

## 2019-10-02 ENCOUNTER — Other Ambulatory Visit: Payer: Self-pay

## 2019-10-03 ENCOUNTER — Other Ambulatory Visit: Payer: Self-pay | Admitting: Obstetrics & Gynecology

## 2019-10-03 ENCOUNTER — Ambulatory Visit (INDEPENDENT_AMBULATORY_CARE_PROVIDER_SITE_OTHER): Payer: BC Managed Care – PPO

## 2019-10-03 DIAGNOSIS — Z780 Asymptomatic menopausal state: Secondary | ICD-10-CM | POA: Diagnosis not present

## 2019-10-03 DIAGNOSIS — M8589 Other specified disorders of bone density and structure, multiple sites: Secondary | ICD-10-CM

## 2019-10-25 DIAGNOSIS — E782 Mixed hyperlipidemia: Secondary | ICD-10-CM | POA: Diagnosis not present

## 2019-10-26 LAB — LIPID PANEL
Chol/HDL Ratio: 2.8 ratio (ref 0.0–4.4)
Cholesterol, Total: 185 mg/dL (ref 100–199)
HDL: 65 mg/dL (ref >39)
LDL Chol Calc (NIH): 100 mg/dL — ABNORMAL HIGH (ref 0–99)
Triglycerides: 111 mg/dL (ref 0–149)
VLDL Cholesterol Cal: 20 mg/dL (ref 5–40)

## 2019-10-26 LAB — HEPATIC FUNCTION PANEL
ALT: 14 IU/L (ref 0–32)
AST: 20 IU/L (ref 0–40)
Albumin: 4.6 g/dL (ref 3.8–4.9)
Alkaline Phosphatase: 131 IU/L — ABNORMAL HIGH (ref 48–121)
Bilirubin Total: 0.5 mg/dL (ref 0.0–1.2)
Bilirubin, Direct: 0.15 mg/dL (ref 0.00–0.40)
Total Protein: 7.4 g/dL (ref 6.0–8.5)

## 2019-10-29 ENCOUNTER — Other Ambulatory Visit: Payer: Self-pay | Admitting: Cardiovascular Disease

## 2019-10-29 ENCOUNTER — Other Ambulatory Visit: Payer: Self-pay | Admitting: Pulmonary Disease

## 2019-11-01 ENCOUNTER — Other Ambulatory Visit: Payer: Self-pay

## 2019-11-01 ENCOUNTER — Ambulatory Visit: Payer: BC Managed Care – PPO | Admitting: Cardiovascular Disease

## 2019-11-01 ENCOUNTER — Encounter: Payer: Self-pay | Admitting: Cardiovascular Disease

## 2019-11-01 DIAGNOSIS — I10 Essential (primary) hypertension: Secondary | ICD-10-CM | POA: Diagnosis not present

## 2019-11-01 DIAGNOSIS — R002 Palpitations: Secondary | ICD-10-CM

## 2019-11-01 DIAGNOSIS — E782 Mixed hyperlipidemia: Secondary | ICD-10-CM | POA: Diagnosis not present

## 2019-11-01 DIAGNOSIS — R0789 Other chest pain: Secondary | ICD-10-CM | POA: Diagnosis not present

## 2019-11-01 MED ORDER — ROSUVASTATIN CALCIUM 10 MG PO TABS
10.0000 mg | ORAL_TABLET | Freq: Every day | ORAL | 3 refills | Status: DC
Start: 2019-11-01 — End: 2020-02-06

## 2019-11-01 NOTE — Assessment & Plan Note (Signed)
History of essential hypertension blood pressure measured today 120/82.  She is on amlodipine.

## 2019-11-01 NOTE — Patient Instructions (Signed)
Medication Instructions:  Stop Lipitor  Start Crestor 10 mg- let us know if you have any issues with this.  *If you need a refill on your cardiac medications before your next appointment, please call your pharmacy*   Follow-Up: At [facility], you and your health needs are our priority.  As part of our continuing mission to provide you with exceptional heart care, we have created designated Provider Care Teams.  These Care Teams include your primary Cardiologist (physician) and Advanced Practice Providers (APPs -  Physician Assistants and Nurse Practitioners) who all work together to provide you with the care you need, when you need it.  We recommend signing up for the patient portal called "MyChart".  Sign up information is provided on this After Visit Summary.  MyChart is used to connect with patients for Virtual Visits (Telemedicine).  Patients are able to view lab/test results, encounter notes, upcoming appointments, etc.  Non-urgent messages can be sent to your provider as well.   To learn more about what you can do with MyChart, go to [email or web address].    Your next appointment:   6 month(s)  The format for your next appointment:   In Person  Provider:   Quay Burow, MD   Other Instructions See PharmD in the next few weeks (4-6 weeks) when you are available.

## 2019-11-01 NOTE — Assessment & Plan Note (Signed)
History of atypical chest pain with a recent coronary CTA that showed a coronary calcium score of 0 with soft plaque in the mid RCA that was not significant by subsequent FFR analysis.  I have reassured her that it is unlikely that her chest pain is ischemically mediated

## 2019-11-01 NOTE — Progress Notes (Signed)
11/01/2019 [name]   1960/11/11  [id number]  Primary Physician Ma Hillock, DO Primary Cardiologist: Lorretta Harp MD Lupe Carney, [place]  HPI:  Rebecca Pollard is a 59 y.o.  mildly overweight married Caucasian female mother of 3 children who does not work and was referred by Dr. Raoul Pitch for cardiovascular evaluation because of chest pain.    I last saw her in the office 07/28/2019.  She does have a history of treated hypertension hyperlipidemia.  There is no family history of heart disease patient never had a heart attack or stroke.  She did have a GXT approxi-10 years ago for evaluation of chest pain which was negative.  She does have polycythemia of unknown type and is currently being evaluated by hematology.  She had new onset chest pain on 06/22/2019 with 2 episodes were fairly brief.  She had recurrent episode the following day that awakened her from sleep rating to her left shoulder and on the 30th as well.  She said no subsequent episodes.  She had a coronary CTA performed 08/14/2019 that showed a coronary calcium score of 0 with soft plaque in the mid RCA and FFR of 0.93 suggesting this was not significant.  In addition, she had a 2-week Zio patch that did not reveal any arrhythmias.  I did begin her on atorvastatin 40 mg a day because of an LDL of 183 seen on lipid profile performed 06/26/2019 with subsequent lipid profile performed 10/25/2019 revealing a decline in her LDL down to 100 although she does complain of symptoms compatible statin intolerance with arthralgias.  I think she would benefit from being on a PCSK9 although we will attempt to start her on rosuvastatin to see if she can tolerate this prior to initiating Repatha.    Current Meds  Medication Sig   amLODipine (NORVASC) 5 MG tablet Take 1 tablet (5 mg total) by mouth daily.   Ascorbic Acid (VITAMIN C) 500 MG CAPS Take 2 tablets by mouth 2 (two) times daily.   co-enzyme Q-10 30 MG capsule Take 30 mg by mouth  3 (three) times daily.   Estradiol 10 MCG TABS vaginal tablet Place 1 tablet (10 mcg total) vaginally 2 (two) times a week. Recommend once weekly, will increase to twice weekly if needed.   montelukast (SINGULAIR) 10 MG tablet TAKE 1 TABLET BY MOUTH EVERYDAY AT BEDTIME   Multiple Vitamins-Minerals (MULTIVITAMIN WITH MINERALS) tablet Take 1 tablet by mouth daily.   Nutritional Supplements (DHEA PO) Take 6 mg by mouth daily.   omeprazole (PRILOSEC) 40 MG capsule Take 40 mg by mouth daily.   polycarbophil (FIBERCON) 625 MG tablet    vitamin B-12 (CYANOCOBALAMIN) 100 MCG tablet Take 100 mcg by mouth daily.   VITAMIN D PO Take by mouth.   [DISCONTINUED] atorvastatin (LIPITOR) 40 MG tablet Take 1 tablet (40 mg total) by mouth daily.     Allergies  Allergen Reactions   Albuterol Sulfate Other (See Comments)   Losartan     Decreased kidney fx   Esomeprazole Palpitations   Magnesium-Containing Compounds Palpitations   Potassium-Containing Compounds Palpitations    Social History   Socioeconomic History   Marital status: Married    Spouse name: Eddie Dibbles   Number of children: 3   Years of education: Not on file   Highest education level: Not on file  Occupational History   Occupation: Water quality scientist  Tobacco Use   Smoking status: Never Smoker   Smokeless tobacco: Never Used  Substance and Sexual Activity   Alcohol use: Not Currently    Comment: rare   Drug use: No   Sexual activity: Yes    Partners: Male    Birth control/protection: Other-see comments    Comment: 1st intercourse- 55, patners- 32, married- 29 yrs   Other Topics Concern   Not on file  Social History Narrative   Married to [place]. 2 children, Alice and [place].    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    Drinks caffeine, herbal remedies, daily vitamin use.    Wears her seatbelt, smoke detector in the home, no firearms in the home.    Feels safe in her relationships.    Exercises 2x a  week.    Social Determinants of Health   Financial Resource Strain:    Difficulty of Paying Living Expenses:   Food Insecurity:    Worried About Charity fundraiser in the Last Year:    Arboriculturist in the Last Year:   Transportation Needs:    Film/video editor (Medical):    Lack of Transportation (Non-Medical):   Physical Activity:    Days of Exercise per Week:    Minutes of Exercise per Session:   Stress:    Feeling of Stress :   Social Connections:    Frequency of Communication with Friends and Family:    Frequency of Social Gatherings with Friends and Family:    Attends Religious Services:    Active Member of Clubs or Organizations:    Attends Music therapist:    Marital Status:   Intimate Partner Violence:    Fear of Current or Ex-Partner:    Emotionally Abused:    Physically Abused:    Sexually Abused:      Review of Systems: General: negative for chills, fever, night sweats or weight changes.  Cardiovascular: negative for chest pain, dyspnea on exertion, edema, orthopnea, palpitations, paroxysmal nocturnal dyspnea or shortness of breath Dermatological: negative for rash Respiratory: negative for cough or wheezing Urologic: negative for hematuria Abdominal: negative for nausea, vomiting, diarrhea, bright red blood per rectum, melena, or hematemesis Neurologic: negative for visual changes, syncope, or dizziness All other systems reviewed and are otherwise negative except as noted above.    Blood pressure 120/82, pulse 70, height 5\' 5"  (1.651 m), weight 176 lb 9.6 oz (80.1 kg), last menstrual period 04/16/2016, SpO2 98 %.  General appearance: alert and no distress Neck: no adenopathy, no carotid bruit, no JVD, supple, symmetrical, trachea midline and thyroid not enlarged, symmetric, no tenderness/mass/nodules Lungs: clear to auscultation bilaterally Heart: regular rate and rhythm, S1, S2 normal, no murmur, click, rub or  gallop Extremities: extremities normal, atraumatic, no cyanosis or edema Pulses: 2+ and symmetric Skin: Skin color, texture, turgor normal. No rashes or lesions Neurologic: Alert and oriented X 3, normal strength and tone. Normal symmetric reflexes. Normal coordination and gait  EKG not performed today  ASSESSMENT AND PLAN:   Essential hypertension History of essential hypertension blood pressure measured today 120/82.  She is on amlodipine.  Hyperlipidemia History of hyperlipidemia with initial lipid profile on 06/26/2019 revealing total cholesterol 290 and LDL of 183.  Placed her on atorvastatin 40 mg a day which resulted in marked improvement with it total cholesterol 185 and LDL of 100 on 10/25/2019 although she is she is complaining of some arthralgias suggesting statin intolerance.  We will discontinue the atorvastatin, and start low-dose rosuvastatin 10 mg a day.  She will follow up with our  Pharm.D. to assess tolerance and titration.  Ideally, I would like her to reach a LDL goal of less than 70 given her RCA plaque on CTA.  Atypical chest pain History of atypical chest pain with a recent coronary CTA that showed a coronary calcium score of 0 with soft plaque in the mid RCA that was not significant by subsequent FFR analysis.  I have reassured her that it is unlikely that her chest pain is ischemically mediated  Palpitations History of palpitations with a recent 2-week Zio patch that showed no evidence of any arrhythmias.      Lorretta Harp MD FACP,FACC,FAHA, [facility] 11/01/2019 9:15 AM

## 2019-11-01 NOTE — Assessment & Plan Note (Signed)
History of hyperlipidemia with initial lipid profile on 06/26/2019 revealing total cholesterol 290 and LDL of 183.  Placed her on atorvastatin 40 mg a day which resulted in marked improvement with it total cholesterol 185 and LDL of 100 on 10/25/2019 although she is she is complaining of some arthralgias suggesting statin intolerance.  We will discontinue the atorvastatin, and start low-dose rosuvastatin 10 mg a day.  She will follow up with our Pharm.D. to assess tolerance and titration.  Ideally, I would like her to reach a LDL goal of less than 70 given her RCA plaque on CTA.

## 2019-11-01 NOTE — Assessment & Plan Note (Signed)
History of palpitations with a recent 2-week Zio patch that showed no evidence of any arrhythmias.

## 2019-11-02 DIAGNOSIS — G4733 Obstructive sleep apnea (adult) (pediatric): Secondary | ICD-10-CM | POA: Diagnosis not present

## 2019-11-02 DIAGNOSIS — R5383 Other fatigue: Secondary | ICD-10-CM | POA: Diagnosis not present

## 2019-11-02 DIAGNOSIS — D751 Secondary polycythemia: Secondary | ICD-10-CM | POA: Diagnosis not present

## 2019-11-02 DIAGNOSIS — R748 Abnormal levels of other serum enzymes: Secondary | ICD-10-CM | POA: Diagnosis not present

## 2019-11-02 DIAGNOSIS — I10 Essential (primary) hypertension: Secondary | ICD-10-CM | POA: Diagnosis not present

## 2019-12-11 ENCOUNTER — Telehealth: Payer: Self-pay | Admitting: Cardiovascular Disease

## 2019-12-11 DIAGNOSIS — Z20822 Contact with and (suspected) exposure to covid-19: Secondary | ICD-10-CM | POA: Diagnosis not present

## 2019-12-11 NOTE — Telephone Encounter (Signed)
Will need patient to come and discuss lipid management options and HTN management. Please re-schedule appointment in office when possible. No virtual visit available at this time.

## 2019-12-11 NOTE — Telephone Encounter (Signed)
Patient will be going to [place] for about 4-6 weeks due to her father being in poor health.  Wanted to know what she could do until then.  Having myalgias with rosuvastatin 10 mg.    Advised that she cut rosuvastatin to 5 mg twice weekly until she can get back into the office for visit.  Patient agreeable to this plan.  Appointment set for mid-September.

## 2019-12-11 NOTE — Telephone Encounter (Signed)
Patient is leaving to go out of the country tomorrow. She wants to know if she can do virtual for her pharmacist appt on 12/14/19.

## 2019-12-12 ENCOUNTER — Ambulatory Visit: Payer: BC Managed Care – PPO

## 2019-12-14 ENCOUNTER — Ambulatory Visit: Payer: BC Managed Care – PPO

## 2020-02-05 ENCOUNTER — Encounter: Payer: Self-pay | Admitting: Primary Care

## 2020-02-05 ENCOUNTER — Ambulatory Visit: Payer: BC Managed Care – PPO | Admitting: Primary Care

## 2020-02-05 ENCOUNTER — Other Ambulatory Visit: Payer: Self-pay

## 2020-02-05 DIAGNOSIS — J410 Simple chronic bronchitis: Secondary | ICD-10-CM

## 2020-02-05 DIAGNOSIS — G4733 Obstructive sleep apnea (adult) (pediatric): Secondary | ICD-10-CM | POA: Diagnosis not present

## 2020-02-05 NOTE — Assessment & Plan Note (Addendum)
-   Reports significant improvement in breathing/respiratory status - Continue Montelukast (Singulair) 5mg  daily - Adding flutter valve and mucinex prn

## 2020-02-05 NOTE — Assessment & Plan Note (Signed)
-   Mild OSA not on CPAP. HST 05/23/19 >> AHI 10.5, SpO2 low 86%.  - Patient has elected to meet with orthodontic to disccus oral appliances. We will refer her to Dr. Ron Parker Dr. Toy Cookey. If patient is not appropriate candidate for divide recommending CPAP with full face mask as she is a mouth breathing and has nasal septal deviation. She will notify us if she would like to proceed with CPAP therapy. - Continue to encourage weight loss and side sleeping position. Advised not to drive if experiencing excessive daytime fatigue or somnolence.  - FU in 6 months with Dr. Halford Chessman

## 2020-02-05 NOTE — Progress Notes (Signed)
@Patient  ID: Rebecca Pollard, female    DOB: Nov 06, 1960, 59 y.o.   MRN: [id number]  Chief Complaint  Patient presents with  . Follow-up    Pt states she has been doing good since last visit and denies any current complaints.    Referring provider: Ma Hillock, DO  HPI: 59 year old female.  Past medical history significant for allergic rhinitis and obstructive sleep apnea.  Patient of Dr. Halford Chessman, last seen August 02, 2019.  She was started on montelukast.  If still having symptoms we will then consider adding inhaled steroid.  Continue as needed albuterol.  Sleep study reviewed with Dr. Halford Chessman, patient would like to optimize allergy therapy first and see if this improves her sleep quality.  If she does deceived to proceed with therapy for OSA she seems more open to trying CPAP first.  Patient advised to follow-up in 6 weeks.   Previous [facility] pulmonary encounters:  08/21/2019 Patient contacted today for an acute visit for medication management/OSA. She reports sleeping and breathing better. She is getting 10 hours of rest and able to take full deep breath. Allergy symptoms have been well controlled recently since starting Montelukast (Singulair). She will be seeing her dentist soon to see if she would be a candidate for an oral appliance. Denies shortness of breath or wheezing.   02/05/2020- Interim hx Patient presents today for 6 month follow-up. She has a hx mild OSA, elected to work on weight loss and side sleeping position. Weight during last visit was around 181lbs. She has lost 3 pounds. First option she would like to consider is an oral appliance.  If not candidate d/t mouth breathing second option should be CPAP full mask. She is taking 1/2 take Singulair during the day to help with dry mouth side effect from medication. Feels it has helped significantly with her breathing and ability to sing. She has some thick congestion in the back of her throught, difficulty getting mucus to move. She does  not like to use nasal sprays d/t deviated septum.   Pulmonary Studies:  HST 05/23/19 >> AHI 10.5, SpO2 low 86%  PFT 08/02/19 >> FEV1 2.85 (101%), FEV1% 89, TLC 4.86 (92%), DLCO 109%, no BD   Allergies  Allergen Reactions  . Albuterol Sulfate Other (See Comments)  . Losartan     Decreased kidney fx  . Esomeprazole Palpitations  . Magnesium-Containing Compounds Palpitations  . Potassium-Containing Compounds Palpitations    Immunization History  Administered Date(s) Administered  . Influenza,inj,Quad PF,6+ Mos 05/31/2015, 06/21/2018, 01/17/2019  . Influenza-Unspecified 03/28/2013  . Moderna SARS-COVID-2 Vaccination 08/10/2019  . Tdap 03/13/2011    Past Medical History:  Diagnosis Date  . Asthma   . Chronic bronchitis ([place])   . Chronic fatigue   . CKD (chronic kidney disease) stage 3, GFR 30-59 ml/min 01/26/2019  . Fibromyalgia   . GERD (gastroesophageal reflux disease)   . Glaucoma   . Hypertension 2019  . IBS (irritable bowel syndrome)   . OSA (obstructive sleep apnea) 05/30/2019  . Osteopenia 09/2016   T score -1.3 FRAX 5.3%/0.3%  . Palpitations    echo and stress test normal 2015 per pt  . Secondary polycythemia 2020  . Urinary incontinence     Tobacco History: Social History   Tobacco Use  Smoking Status Never Smoker  Smokeless Tobacco Never Used   Counseling given: Not Answered   Outpatient Medications Prior to Visit  Medication Sig Dispense Refill  . amLODipine (NORVASC) 5 MG tablet Take 1 tablet (  5 mg total) by mouth daily. 90 tablet 1  . Ascorbic Acid (VITAMIN C) 500 MG CAPS Take 2 tablets by mouth 2 (two) times daily.    Marland Kitchen co-enzyme Q-10 30 MG capsule Take 30 mg by mouth 3 (three) times daily.    . Estradiol 10 MCG TABS vaginal tablet Place 1 tablet (10 mcg total) vaginally 2 (two) times a week. Recommend once weekly, will increase to twice weekly if needed. 24 tablet 4  . montelukast (SINGULAIR) 10 MG tablet TAKE 1 TABLET BY MOUTH EVERYDAY AT BEDTIME  90 tablet 2  . Multiple Vitamins-Minerals (MULTIVITAMIN WITH MINERALS) tablet Take 1 tablet by mouth daily.    . Nutritional Supplements (DHEA PO) Take 6 mg by mouth daily.    Marland Kitchen omeprazole (PRILOSEC) 40 MG capsule Take 40 mg by mouth daily.    . rosuvastatin (CRESTOR) 10 MG tablet Take 5 mg by mouth daily.     . vitamin B-12 (CYANOCOBALAMIN) 100 MCG tablet Take 100 mcg by mouth daily.    Marland Kitchen VITAMIN D PO Take by mouth.    . polycarbophil (FIBERCON) 625 MG tablet  (Patient not taking: Reported on 02/05/2020)    . rosuvastatin (CRESTOR) 10 MG tablet Take 1 tablet (10 mg total) by mouth daily. (Patient taking differently: Take 5 mg by mouth 2 (two) times a week. ) 90 tablet 3   No facility-administered medications prior to visit.   Review of Systems  Review of Systems  Constitutional: Negative.   HENT: Positive for congestion.   Respiratory: Positive for cough. Negative for chest tightness, shortness of breath and wheezing.    Physical Exam  BP 120/70 (BP Location: Left Arm, Cuff Size: Normal)   Pulse 81   Temp (!) 97.2 F (36.2 C) (Other (Comment)) Comment (Src): wrist  Ht 5\' 5"  (1.651 m)   Wt 178 lb 9.6 oz (81 kg)   LMP 04/16/2016 (Approximate)   SpO2 99%   BMI 29.72 kg/m  Physical Exam Constitutional:      Appearance: Normal appearance.  HENT:     Nose:     Comments: Right deviated septum     Mouth/Throat:     Mouth: Mucous membranes are moist.     Pharynx: Oropharynx is clear.     Comments: Mallampati class I Cardiovascular:     Rate and Rhythm: Normal rate and regular rhythm.  Pulmonary:     Effort: Pulmonary effort is normal.     Breath sounds: Normal breath sounds. No wheezing or rhonchi.  Skin:    General: Skin is warm and dry.  Neurological:     General: No focal deficit present.     Mental Status: She is alert and oriented to person, place, and time. Mental status is at baseline.  Psychiatric:        Mood and Affect: Mood normal.        Behavior: Behavior  normal.        Thought Content: Thought content normal.        Judgment: Judgment normal.      Lab Results:  CBC    Component Value Date/Time   WBC 7.3 08/17/2019 1112   WBC 6.3 06/26/2019 1014   RBC 5.23 (H) 08/17/2019 1112   HGB 15.7 (H) 08/17/2019 1112   HCT 48.3 (H) 08/17/2019 1112   PLT 286 08/17/2019 1112   MCV 92.4 08/17/2019 1112   MCH 30.0 08/17/2019 1112   MCHC 32.5 08/17/2019 1112   RDW 13.7 08/17/2019 1112  LYMPHSABS 1.5 08/17/2019 1112   MONOABS 0.5 08/17/2019 1112   EOSABS 0.1 08/17/2019 1112   BASOSABS 0.1 08/17/2019 1112    BMET    Component Value Date/Time   NA 141 03/01/2019 0851   K 3.9 03/01/2019 0851   CL 105 03/01/2019 0851   CO2 28 03/01/2019 0851   GLUCOSE 89 03/01/2019 0851   BUN 11 03/01/2019 0851   CREATININE 0.90 08/11/2019 1003   CREATININE 0.95 02/28/2019 1010   CREATININE 0.92 07/27/2016 1556   CALCIUM 9.4 03/01/2019 0851   GFRNONAA >60 02/28/2019 1010   GFRNONAA 71 07/27/2016 1556   GFRAA >60 02/28/2019 1010   GFRAA 82 07/27/2016 1556    BNP No results found for: BNP  ProBNP No results found for: PROBNP  Imaging: No results found.   Assessment & Plan:   OSA (obstructive sleep apnea) - Mild OSA not on CPAP. HST 05/23/19 >> AHI 10.5, SpO2 low 86%.  - Patient has elected to meet with orthodontic to disccus oral appliances. We will refer her to Dr. Ron Parker Dr. Toy Cookey. If patient is not appropriate candidate for divide recommending CPAP with full face mask as she is a mouth breathing and has nasal septal deviation. She will notify us if she would like to proceed with CPAP therapy. - Continue to encourage weight loss and side sleeping position. Advised not to drive if experiencing excessive daytime fatigue or somnolence.  - FU in 6 months with Dr. Halford Chessman   Chronic bronchitis [facility]) - Reports significant improvement in breathing/respiratory status - Continue Montelukast (Singulair) 5mg  daily - Adding flutter valve and mucinex prn     Martyn Ehrich, NP 02/05/2020

## 2020-02-05 NOTE — Progress Notes (Signed)
Reviewed and agree with assessment/plan.   Chesley Mires, MD [facility] Pulmonary/Critical Care 02/05/2020, 11:54 AM Pager:  [phone]

## 2020-02-05 NOTE — Patient Instructions (Addendum)
Nice seeing you today Rebecca Pollard  OSA: - Continue to work on weight loss - Encourage side sleeping position - Do not drive if experiencing excessive daytime fatigue or somnolence  Cough: - Continue Monteluast 5mg  daily - Start Mucinex 1 TABLET (600mg ) once daily as needed for congestion( OVER THE COUNTER) - Use flutter valve 2-3 times a day for congestion   Referral: - Dr. Ron Parker and Dr. Augustina Mood re: sleep apnea/oral appliance   Follow-up: - 6-8 month with Dr. Halford Chessman   Sleep Apnea Sleep apnea affects breathing during sleep. It causes breathing to stop for a short time or to become shallow. It can also increase the risk of:  Heart attack.  Stroke.  Being very overweight (obese).  Diabetes.  Heart failure.  Irregular heartbeat. The goal of treatment is to help you breathe normally again. What are the causes? There are three kinds of sleep apnea:  Obstructive sleep apnea. This is caused by a blocked or collapsed airway.  Central sleep apnea. This happens when the brain does not send the right signals to the muscles that control breathing.  Mixed sleep apnea. This is a combination of obstructive and central sleep apnea. The most common cause of this condition is a collapsed or blocked airway. This can happen if:  Your throat muscles are too relaxed.  Your tongue and tonsils are too large.  You are overweight.  Your airway is too small. What increases the risk?  Being overweight.  Smoking.  Having a small airway.  Being older.  Being female.  Drinking alcohol.  Taking medicines to calm yourself (sedatives or tranquilizers).  Having family members with the condition. What are the signs or symptoms?  Trouble staying asleep.  Being sleepy or tired during the day.  Getting angry a lot.  Loud snoring.  Headaches in the morning.  Not being able to focus your mind (concentrate).  Forgetting things.  Less interest in sex.  Mood  swings.  Personality changes.  Feelings of sadness (depression).  Waking up a lot during the night to pee (urinate).  Dry mouth.  Sore throat. How is this diagnosed?  Your medical history.  A physical exam.  A test that is done when you are sleeping (sleep study). The test is most often done in a sleep lab but may also be done at home. How is this treated?   Sleeping on your side.  Using a medicine to get rid of mucus in your nose (decongestant).  Avoiding the use of alcohol, medicines to help you relax, or certain pain medicines (narcotics).  Losing weight, if needed.  Changing your diet.  Not smoking.  Using a machine to open your airway while you sleep, such as: ? An oral appliance. This is a mouthpiece that shifts your lower jaw forward. ? A CPAP device. This device blows air through a mask when you breathe out (exhale). ? An EPAP device. This has valves that you put in each nostril. ? A BPAP device. This device blows air through a mask when you breathe in (inhale) and breathe out.  Having surgery if other treatments do not work. It is important to get treatment for sleep apnea. Without treatment, it can lead to:  High blood pressure.  Coronary artery disease.  In men, not being able to have an erection (impotence).  Reduced thinking ability. Follow these instructions at home: Lifestyle  Make changes that your doctor recommends.  Eat a healthy diet.  Lose weight if needed.  Avoid alcohol, medicines to help you relax, and some pain medicines.  Do not use any products that contain nicotine or tobacco, such as cigarettes, e-cigarettes, and chewing tobacco. If you need help quitting, ask your doctor. General instructions  Take over-the-counter and prescription medicines only as told by your doctor.  If you were given a machine to use while you sleep, use it only as told by your doctor.  If you are having surgery, make sure to tell your doctor you  have sleep apnea. You may need to bring your device with you.  Keep all follow-up visits as told by your doctor. This is important. Contact a doctor if:  The machine that you were given to use during sleep bothers you or does not seem to be working.  You do not get better.  You get worse. Get help right away if:  Your chest hurts.  You have trouble breathing in enough air.  You have an uncomfortable feeling in your back, arms, or stomach.  You have trouble talking.  One side of your body feels weak.  A part of your face is hanging down. These symptoms may be an emergency. Do not wait to see if the symptoms will go away. Get medical help right away. Call your local emergency services (911 in the [place] Do not drive yourself to the hospital. Summary  This condition affects breathing during sleep.  The most common cause is a collapsed or blocked airway.  The goal of treatment is to help you breathe normally while you sleep. This information is not intended to replace advice given to you by your health care provider. Make sure you discuss any questions you have with your health care provider. Document Revised: 02/25/2018 Document Reviewed: 01/04/2018 Elsevier Patient Education  [place].

## 2020-02-05 NOTE — Addendum Note (Signed)
Addended by: Lorretta Harp on: 02/05/2020 09:41 AM   Modules accepted: Orders

## 2020-02-06 ENCOUNTER — Ambulatory Visit (INDEPENDENT_AMBULATORY_CARE_PROVIDER_SITE_OTHER): Payer: BC Managed Care – PPO | Admitting: Pharmacist Clinician (PhC)/ Clinical Pharmacy Specialist

## 2020-02-06 VITALS — BP 130/90 | HR 75 | Resp 15 | Ht 65.0 in | Wt 180.2 lb

## 2020-02-06 DIAGNOSIS — D751 Secondary polycythemia: Secondary | ICD-10-CM | POA: Diagnosis not present

## 2020-02-06 DIAGNOSIS — I10 Essential (primary) hypertension: Secondary | ICD-10-CM

## 2020-02-06 DIAGNOSIS — E782 Mixed hyperlipidemia: Secondary | ICD-10-CM | POA: Diagnosis not present

## 2020-02-06 LAB — LIPID PANEL
Chol/HDL Ratio: 2.6 ratio (ref 0.0–4.4)
Cholesterol, Total: 182 mg/dL (ref 100–199)
HDL: 70 mg/dL (ref >39)
LDL Chol Calc (NIH): 92 mg/dL (ref 0–99)
Triglycerides: 111 mg/dL (ref 0–149)
VLDL Cholesterol Cal: 20 mg/dL (ref 5–40)

## 2020-02-06 LAB — HEPATIC FUNCTION PANEL
ALT: 16 IU/L (ref 0–32)
AST: 18 IU/L (ref 0–40)
Albumin: 4.4 g/dL (ref 3.8–4.9)
Alkaline Phosphatase: 137 IU/L — ABNORMAL HIGH (ref 44–121)
Bilirubin Total: 0.3 mg/dL (ref 0.0–1.2)
Bilirubin, Direct: 0.11 mg/dL (ref 0.00–0.40)
Total Protein: 7.4 g/dL (ref 6.0–8.5)

## 2020-02-06 MED ORDER — NEXLETOL 180 MG PO TABS: 180.0000 mg | ORAL_TABLET | Freq: Every day | ORAL | 3 refills | Status: DC

## 2020-02-06 NOTE — Patient Instructions (Addendum)
Your Results:             Your most recent labs Goal  Total Cholesterol 185 < 200  Triglycerides 111 < 150  HDL (happy/good cholesterol) 65 > 40  LDL (lousy/bad cholesterol 100 < 70   < 100   Medication changes:  Once we get your lab results will have you start Nexletol 180 mg once daily.    Blood pressure:   Check your blood pressure at home 2-3 times each week. Average the readings.  The bottom number (diastolic) should be < 80.  If it is higher than 85 on a regular basis, please give [place] a call  Lab orders:  Go to lab today for cholesterol and liver tests  Patient Assistance:  The [employer] foundation offers assistance to help pay for medication copays.  They will cover copays for all cholesterol lowering meds, including statins, fibrates, omega-3 oils, ezetimibe, Repatha, Praluent, Nexletol, Nexlizet.  The cards are usually good for $2,500 or 12 months, whichever comes first. 1. Go to healthwellfoundation.org 2. Click on "Apply Now" 3. Answer questions as to whom is applying (patient or representative) 4. Your disease fund will be "hypercholesterolemia - Medicare access" 5. They will ask questions about finances and which medications you are taking for cholesterol 6. When you submit, the approval is usually within minutes.  You will need to print the card information from the site 7. You will need to show this information to your pharmacy, they will bill your Medicare Part D plan first -then bill [employer] --for the copay.   You can also call them at [phone], although the hold times can be quite long.   Thank you for choosing [facility]

## 2020-02-06 NOTE — Progress Notes (Signed)
02/06/2020 Rebecca Pollard 02-26-1961 [id number]   HPI:  Rebecca Pollard is a 59 y.o. female patient of Dr Rebecca Pollard, who presents today for a lipid clinic evaluation.  Her medical history is significant for unknown type polycythemia and ASCVD.   A recent coronary CT showed possible moderate plaque with 51-69% blockage.  Her baseline LDL was at 183.  She has tried atorvastatin in the past but had to discontinue due to myalgias.  She was then switched to rosuvastatin 10 mg, which she again did not tolerate.  She was asked to try cutting the dose in half, and to this point is still doing well with the 5 mg daily.  She has been on this dose for about 2 months now, having cut back in mid-July.  Her most recent labs were drawn in June, while on the atorvastatin 40 mg.    Today she is in the office to discuss options for lowering cholesterol.  We don't have current labs with the rosuvastatin 5 mg dose, so will have those drawn today.    Current Medications: rosuvastatin 5 mg daily  Cholesterol Goals: LDL < 70   Intolerant/previously tried: atorvastatin 40 mg qd - myalgias; rosuvastatin 10 - myalgias  Family history: father with cabg in early 70's high cholesterol ; mother also with high cholesterol, no cardiac sx; no MI among siblings; 3 children (33,25,23)  Diet: mostly home cooked; previously vegan; tried Keto, but did not like; mother was dietary weight loss manager; not much meat; highly plant based, some poultry; no salt  Exercise: no regular exercise  Labs: 6/21:  TC 185, TG 111, HDL 65, LDL 100 (atorvastatin 40 mg)  2/21:  TC 290, TG 169, HDL 72.9, LDL 183 (baseline)   Current Outpatient Medications  Medication Sig Dispense Refill  . amLODipine (NORVASC) 5 MG tablet Take 1 tablet (5 mg total) by mouth daily. 90 tablet 1  . Ascorbic Acid (VITAMIN C) 500 MG CAPS Take 2 tablets by mouth 2 (two) times daily.    Marland Kitchen co-enzyme Q-10 30 MG capsule Take 30 mg by mouth daily. Take 1 pill daily    .  Estradiol 10 MCG TABS vaginal tablet Place 1 tablet (10 mcg total) vaginally 2 (two) times a week. Recommend once weekly, will increase to twice weekly if needed. 24 tablet 4  . montelukast (SINGULAIR) 10 MG tablet TAKE 1 TABLET BY MOUTH EVERYDAY AT BEDTIME 90 tablet 2  . Multiple Vitamins-Minerals (MULTIVITAMIN WITH MINERALS) tablet Take 1 tablet by mouth daily.    . Nutritional Supplements (DHEA PO) Take 6 mg by mouth daily.    Marland Kitchen omeprazole (PRILOSEC) 40 MG capsule Take 40 mg by mouth daily.    . polycarbophil (FIBERCON) 625 MG tablet     . rosuvastatin (CRESTOR) 10 MG tablet Take 5 mg by mouth daily.     . vitamin B-12 (CYANOCOBALAMIN) 100 MCG tablet Take 100 mcg by mouth daily.    Marland Kitchen VITAMIN D PO Take by mouth.     No current facility-administered medications for this visit.    Allergies  Allergen Reactions  . Albuterol Sulfate Other (See Comments)  . Losartan     Decreased kidney fx  . Esomeprazole Palpitations  . Magnesium-Containing Compounds Palpitations  . Potassium-Containing Compounds Palpitations    Past Medical History:  Diagnosis Date  . Asthma   . Chronic bronchitis ([place])   . Chronic fatigue   . CKD (chronic kidney disease) stage 3, GFR 30-59 ml/min 01/26/2019  . Fibromyalgia   .  GERD (gastroesophageal reflux disease)   . Glaucoma   . Hypertension 2019  . IBS (irritable bowel syndrome)   . OSA (obstructive sleep apnea) 05/30/2019  . Osteopenia 09/2016   T score -1.3 FRAX 5.3%/0.3%  . Palpitations    echo and stress test normal 2015 per pt  . Secondary polycythemia 2020  . Urinary incontinence     Blood pressure 130/90, pulse 75, resp. rate 15, height 5\' 5"  (1.651 m), weight 180 lb 3.2 oz (81.7 kg), last menstrual period 04/16/2016, SpO2 98 %.   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  Will repeat labs today to determine LDL on current statin therapy.  Reviewed options for lowering LDL cholesterol, including  ezetimibe, PCSK-9 inhibitors and bempedoic acid.  Discussed mechanisms of action, dosing, side effects and potential decreases in LDL cholesterol.  Answered all patient questions.  We will see if we can get approval thru insurance for Nexletol 180 mg daily.   Will have her repeat lipid labs after 3 months on medication.  She was encouraged to continue with her mostly plant based diet.    Essential hypertension Patient with diastolic hypertension, not at goal in the office today.  She notes that home readings are better, currently on amlodipine 5 mg daily.  Will have her check home BP readings 2-3 times each week and average diastolic readings.  Should those average readings be > 85 on more than one occasion, she will need to contact the office.     Rebecca Pollard PharmD CPP [facility] [address] [place], [place] [address] [phone]

## 2020-02-06 NOTE — Assessment & Plan Note (Addendum)
Patient with hyperlipidemia and up to 69% blockage of RCA by coronary CT.  Will repeat labs today to determine LDL on current statin therapy.  Reviewed options for lowering LDL cholesterol, including ezetimibe, PCSK-9 inhibitors and bempedoic acid.  Discussed mechanisms of action, dosing, side effects and potential decreases in LDL cholesterol.  Answered all patient questions.  We will see if we can get approval thru insurance for Nexletol 180 mg daily.   Will have her repeat lipid labs after 3 months on medication.  She was encouraged to continue with her mostly plant based diet.    After reviewing labs with patient, she would prefer to start PCSK-9 inhibitor rather than Nexletol.  Will submit paperwork for that instead.

## 2020-02-06 NOTE — Assessment & Plan Note (Signed)
Patient with diastolic hypertension, not at goal in the office today.  She notes that home readings are better, currently on amlodipine 5 mg daily.  Will have her check home BP readings 2-3 times each week and average diastolic readings.  Should those average readings be > 85 on more than one occasion, she will need to contact the office.

## 2020-02-14 ENCOUNTER — Telehealth: Payer: Self-pay | Admitting: Cardiovascular Disease

## 2020-02-14 NOTE — Telephone Encounter (Signed)
Pt c/o medication issue:  1. Name of Medication: Bempedoic Acid (NEXLETOL) 180 MG TABS  2. How are you currently taking this medication (dosage and times per day)? Quit taking  3. Are you having a reaction (difficulty breathing--STAT)? yes  4. What is your medication issue? Patient states that this medication gave her a lot of pain. She didn't take one yesterday and states the pain went away so she did not take one today either. She does not think this medication is for her.

## 2020-02-14 NOTE — Telephone Encounter (Signed)
Returned call - patient having problems with whole body aches in the past few days.  Has been on rosuvastatin 5 mg daily with 10 mg MF and added Nexletol 180 mg several days ago.  She has always had some arthritis/aches from the rosuvastatin, but was trying to work thru them.    Advised that she hold both medications for 2 more day - to get aches to go away completely.  Then re-start with just the Nexletol 180 mg daily and see how she tolerates just this.  If she does well, will have her continue and repeat labs in 3 months.   Patient voiced understanding with plan

## 2020-02-16 ENCOUNTER — Other Ambulatory Visit: Payer: Self-pay | Admitting: *Deleted

## 2020-02-16 DIAGNOSIS — D582 Other hemoglobinopathies: Secondary | ICD-10-CM

## 2020-02-18 NOTE — Progress Notes (Signed)
Patient Care Team: Percell Belt, DO as PCP - General (Family Medicine) Michel Bickers, MD as Consulting Physician (Infectious Diseases) Nicholas Lose, MD as Consulting Physician (Hematology and Oncology) Mansouraty, Telford Nab., MD as Consulting Physician (Gastroenterology) Chesley Mires, MD as Consulting Physician (Pulmonary Disease)  DIAGNOSIS:    ICD-10-CM   1. Erythrocytosis  D75.1     CHIEF COMPLIANT: Follow-up of secondary erythrocytosis  INTERVAL HISTORY: Rebecca Pollard is a 59 y.o. with above-mentioned history of secondary erythrocytosis. She presents to the clinic todayto review her labs.  She has noticed bruising in different parts of the body especially on the belly and her thighs.  She does take aspirin on an as-needed basis.  She feels that some of the aches and pains start to happen 2 to 3 months after giving her blood donation.  She has been trying to donate blood every 3 months.  ALLERGIES:  is allergic to albuterol sulfate, losartan, esomeprazole, magnesium-containing compounds, and potassium-containing compounds.  MEDICATIONS:  Current Outpatient Medications  Medication Sig Dispense Refill  . amLODipine (NORVASC) 5 MG tablet Take 1 tablet (5 mg total) by mouth daily. 90 tablet 1  . Ascorbic Acid (VITAMIN C) 500 MG CAPS Take 2 tablets by mouth 2 (two) times daily.    . Bempedoic Acid (NEXLETOL) 180 MG TABS Take 180 mg by mouth daily. 90 tablet 3  . co-enzyme Q-10 30 MG capsule Take 30 mg by mouth daily. Take 1 pill daily    . Estradiol 10 MCG TABS vaginal tablet Place 1 tablet (10 mcg total) vaginally 2 (two) times a week. Recommend once weekly, will increase to twice weekly if needed. 24 tablet 4  . montelukast (SINGULAIR) 10 MG tablet TAKE 1 TABLET BY MOUTH EVERYDAY AT BEDTIME 90 tablet 2  . Multiple Vitamins-Minerals (MULTIVITAMIN WITH MINERALS) tablet Take 1 tablet by mouth daily.    . Nutritional Supplements (DHEA PO) Take 6 mg by mouth daily.    Marland Kitchen omeprazole  (PRILOSEC) 40 MG capsule Take 40 mg by mouth daily.    . polycarbophil (FIBERCON) 625 MG tablet     . vitamin B-12 (CYANOCOBALAMIN) 100 MCG tablet Take 100 mcg by mouth daily.    Marland Kitchen VITAMIN D PO Take by mouth.     No current facility-administered medications for this visit.    PHYSICAL EXAMINATION: ECOG PERFORMANCE STATUS: 1 - Symptomatic but completely ambulatory  Vitals:   02/19/20 1350  BP: (!) 140/96  Pulse: 74  Resp: 17  Temp: 97.7 F (36.5 C)  SpO2: 99%   Filed Weights   02/19/20 1350  Weight: 177 lb 4.8 oz (80.4 kg)    LABORATORY DATA:  I have reviewed the data as listed CMP Latest Ref Rng & Units 02/06/2020 10/25/2019 08/11/2019  Glucose 70 - 99 mg/dL - - -  BUN 6 - 23 mg/dL - - -  Creatinine 0.44 - 1.00 mg/dL - - 0.90  Sodium 135 - 145 mEq/L - - -  Potassium 3.5 - 5.1 mEq/L - - -  Chloride 96 - 112 mEq/L - - -  CO2 19 - 32 mEq/L - - -  Calcium 8.4 - 10.5 mg/dL - - -  Total Protein 6.0 - 8.5 g/dL 7.4 7.4 -  Total Bilirubin 0.0 - 1.2 mg/dL 0.3 0.5 -  Alkaline Phos 44 - 121 IU/L 137(H) 131(H) -  AST 0 - 40 IU/L 18 20 -  ALT 0 - 32 IU/L 16 14 -    Lab Results  Component  Value Date   WBC 6.1 02/19/2020   HGB 14.7 02/19/2020   HCT 45.9 02/19/2020   MCV 94.1 02/19/2020   PLT 291 02/19/2020   NEUTROABS 4.0 02/19/2020    ASSESSMENT & PLAN:  Erythrocytosis 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  Lab review:  Hemoglobin is 14.7 improved from 15.7 Current treatment: Patient was given blood donations every 3 months Sleep apnea: Following with her PCP Hypercholesterolemia: On a new cholesterol medication.  Return to clinic in 1 year for labs and follow-up.    No orders of the defined types were placed in this encounter.  The patient has a good understanding of the  overall plan. she agrees with it. she will call with any problems that may develop before the next visit here.  Total time spent: 20 mins including face to face time and time spent for planning, charting and coordination of care  Nicholas Lose, MD 02/19/2020  I, Cloyde Reams Dorshimer, am acting as scribe for Dr. Nicholas Lose.  I have reviewed the above documentation for accuracy and completeness, and I agree with the above.

## 2020-02-19 ENCOUNTER — Other Ambulatory Visit: Payer: Self-pay

## 2020-02-19 ENCOUNTER — Inpatient Hospital Stay (HOSPITAL_BASED_OUTPATIENT_CLINIC_OR_DEPARTMENT_OTHER): Payer: BC Managed Care – PPO | Admitting: Hematology and Oncology

## 2020-02-19 ENCOUNTER — Inpatient Hospital Stay: Payer: BC Managed Care – PPO | Attending: Hematology and Oncology

## 2020-02-19 ENCOUNTER — Telehealth: Payer: Self-pay | Admitting: Hematology and Oncology

## 2020-02-19 DIAGNOSIS — Z7982 Long term (current) use of aspirin: Secondary | ICD-10-CM | POA: Insufficient documentation

## 2020-02-19 DIAGNOSIS — D751 Secondary polycythemia: Secondary | ICD-10-CM

## 2020-02-19 DIAGNOSIS — Z79899 Other long term (current) drug therapy: Secondary | ICD-10-CM | POA: Insufficient documentation

## 2020-02-19 DIAGNOSIS — E7800 Pure hypercholesterolemia, unspecified: Secondary | ICD-10-CM | POA: Insufficient documentation

## 2020-02-19 DIAGNOSIS — D582 Other hemoglobinopathies: Secondary | ICD-10-CM

## 2020-02-19 DIAGNOSIS — G4733 Obstructive sleep apnea (adult) (pediatric): Secondary | ICD-10-CM | POA: Diagnosis not present

## 2020-02-19 LAB — CBC WITH DIFFERENTIAL (CANCER CENTER ONLY)
Abs Immature Granulocytes: 0.01 10*3/uL (ref 0.00–0.07)
Basophils Absolute: 0.1 10*3/uL (ref 0.0–0.1)
Basophils Relative: 1 %
Eosinophils Absolute: 0.1 10*3/uL (ref 0.0–0.5)
Eosinophils Relative: 1 %
HCT: 45.9 % (ref 36.0–46.0)
Hemoglobin: 14.7 g/dL (ref 12.0–15.0)
Immature Granulocytes: 0 %
Lymphocytes Relative: 24 %
Lymphs Abs: 1.4 10*3/uL (ref 0.7–4.0)
MCH: 30.1 pg (ref 26.0–34.0)
MCHC: 32 g/dL (ref 30.0–36.0)
MCV: 94.1 fL (ref 80.0–100.0)
Monocytes Absolute: 0.5 10*3/uL (ref 0.1–1.0)
Monocytes Relative: 8 %
Neutro Abs: 4 10*3/uL (ref 1.7–7.7)
Neutrophils Relative %: 66 %
Platelet Count: 291 10*3/uL (ref 150–400)
RBC: 4.88 MIL/uL (ref 3.87–5.11)
RDW: 13.9 % (ref 11.5–15.5)
WBC Count: 6.1 10*3/uL (ref 4.0–10.5)
nRBC: 0 % (ref 0.0–0.2)

## 2020-02-19 LAB — FERRITIN: Ferritin: 15 ng/mL (ref 11–307)

## 2020-02-19 NOTE — Assessment & Plan Note (Signed)
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  Lab review:  Current treatment: Patient was given blood donations every 3 months Sleep apnea:   Return to clinic in 1 year for labs and follow-up.

## 2020-02-19 NOTE — Telephone Encounter (Signed)
Scheduled appts per 9/27 los. Gave pt a print out of AVS.

## 2020-02-20 LAB — ERYTHROPOIETIN: Erythropoietin: 10.3 m[IU]/mL (ref 2.6–18.5)

## 2020-02-23 ENCOUNTER — Other Ambulatory Visit: Payer: Self-pay | Admitting: Hematology and Oncology

## 2020-02-23 DIAGNOSIS — D751 Secondary polycythemia: Secondary | ICD-10-CM

## 2020-02-26 ENCOUNTER — Telehealth: Payer: Self-pay | Admitting: Hematology and Oncology

## 2020-02-26 NOTE — Telephone Encounter (Signed)
Scheduled appt per 10/1 sch msg - unable to leave message. Left message for patient with appt date and time

## 2020-02-28 ENCOUNTER — Other Ambulatory Visit: Payer: Self-pay

## 2020-02-28 ENCOUNTER — Inpatient Hospital Stay: Payer: BC Managed Care – PPO | Attending: Hematology and Oncology

## 2020-02-28 ENCOUNTER — Ambulatory Visit: Payer: BC Managed Care – PPO | Admitting: Hematology and Oncology

## 2020-02-28 ENCOUNTER — Other Ambulatory Visit: Payer: BC Managed Care – PPO

## 2020-02-28 DIAGNOSIS — D751 Secondary polycythemia: Secondary | ICD-10-CM | POA: Insufficient documentation

## 2020-02-28 NOTE — Patient Instructions (Signed)
Therapeutic Phlebotomy Therapeutic phlebotomy is the planned removal of blood from a person's body for the purpose of treating a medical condition. The procedure is similar to donating blood. Usually, about a pint (470 mL, or 0.47 L) of blood is removed. The average adult has 9-12 pints (4.3-5.7 L) of blood in the body. Therapeutic phlebotomy may be used to treat the following medical conditions:  Hemochromatosis. This is a condition in which the blood contains too much iron.  Polycythemia vera. This is a condition in which the blood contains too many red blood cells.  Porphyria cutanea tarda. This is a disease in which an important part of hemoglobin is not made properly. It results in the buildup of abnormal amounts of porphyrins in the body.  Sickle cell disease. This is a condition in which the red blood cells form an abnormal crescent shape rather than a round shape. Tell a health care provider about:  Any allergies you have.  All medicines you are taking, including vitamins, herbs, eye drops, creams, and over-the-counter medicines.  Any problems you or family members have had with anesthetic medicines.  Any blood disorders you have.  Any surgeries you have had.  Any medical conditions you have.  Whether you are pregnant or may be pregnant. What are the risks? Generally, this is a safe procedure. However, problems may occur, including:  Nausea or light-headedness.  Low blood pressure (hypotension).  Soreness, bleeding, swelling, or bruising at the needle insertion site.  Infection. What happens before the procedure?  Follow instructions from your health care provider about eating or drinking restrictions.  Ask your health care provider about: ? Changing or stopping your regular medicines. This is especially important if you are taking diabetes medicines or blood thinners (anticoagulants). ? Taking medicines such as aspirin and ibuprofen. These medicines can thin your  blood. Do not take these medicines unless your health care provider tells you to take them. ? Taking over-the-counter medicines, vitamins, herbs, and supplements.  Wear clothing with sleeves that can be raised above the elbow.  Plan to have someone take you home from the hospital or clinic.  You may have a blood sample taken.  Your blood pressure, pulse rate, and breathing rate will be measured. What happens during the procedure?   To lower your risk of infection: ? Your health care team will wash or sanitize their hands. ? Your skin will be cleaned with an antiseptic.  You may be given a medicine to numb the area (local anesthetic).  A tourniquet will be placed on your arm.  A needle will be inserted into one of your veins.  Tubing and a collection bag will be attached to that needle.  Blood will flow through the needle and tubing into the collection bag.  The collection bag will be placed lower than your arm to allow gravity to help the flow of blood into the bag.  You may be asked to open and close your hand slowly and continually during the entire collection.  After the specified amount of blood has been removed from your body, the collection bag and tubing will be clamped.  The needle will be removed from your vein.  Pressure will be held on the site of the needle insertion to stop the bleeding.  A bandage (dressing) will be placed over the needle insertion site. The procedure may vary among health care providers and hospitals. What happens after the procedure?  Your blood pressure, pulse rate, and breathing rate will be   measured after the procedure.  You will be encouraged to drink fluids.  Your recovery will be assessed and monitored.  You can return to your normal activities as told by your health care provider. Summary  Therapeutic phlebotomy is the planned removal of blood from a person's body for the purpose of treating a medical condition.  Therapeutic  phlebotomy may be used to treat hemochromatosis, polycythemia vera, porphyria cutanea tarda, or sickle cell disease.  In the procedure, a needle is inserted and about a pint (470 mL, or 0.47 L) of blood is removed. The average adult has 9-12 pints (4.3-5.7 L) of blood in the body.  This is generally a safe procedure, but it can sometimes cause problems such as nausea, light-headedness, or low blood pressure (hypotension). This information is not intended to replace advice given to you by your health care provider. Make sure you discuss any questions you have with your health care provider. Document Revised: 05/27/2017 Document Reviewed: 05/27/2017 Elsevier Patient Education  2020 [employer]  

## 2020-02-28 NOTE — Progress Notes (Signed)
Rebecca Pollard presents today for phlebotomy per MD orders. Phlebotomy procedure started at 0749 and ended at 27. 514 grams removed. 16 G IV needle removed from [place] intact. Patient observed for 30 minutes after procedure without any incident. Patient received a beverage and snacks.  Patient tolerated procedure well.

## 2020-04-15 DIAGNOSIS — H539 Unspecified visual disturbance: Secondary | ICD-10-CM | POA: Diagnosis not present

## 2020-04-16 DIAGNOSIS — H35033 Hypertensive retinopathy, bilateral: Secondary | ICD-10-CM | POA: Diagnosis not present

## 2020-04-16 DIAGNOSIS — H40013 Open angle with borderline findings, low risk, bilateral: Secondary | ICD-10-CM | POA: Diagnosis not present

## 2020-04-16 DIAGNOSIS — H43813 Vitreous degeneration, bilateral: Secondary | ICD-10-CM | POA: Diagnosis not present

## 2020-04-16 DIAGNOSIS — H35361 Drusen (degenerative) of macula, right eye: Secondary | ICD-10-CM | POA: Diagnosis not present

## 2020-04-16 DIAGNOSIS — H3589 Other specified retinal disorders: Secondary | ICD-10-CM | POA: Diagnosis not present

## 2020-04-17 ENCOUNTER — Other Ambulatory Visit: Payer: Self-pay | Admitting: Pulmonary Disease

## 2020-04-29 DIAGNOSIS — H40013 Open angle with borderline findings, low risk, bilateral: Secondary | ICD-10-CM | POA: Diagnosis not present

## 2020-04-29 DIAGNOSIS — H35033 Hypertensive retinopathy, bilateral: Secondary | ICD-10-CM | POA: Diagnosis not present

## 2020-04-29 DIAGNOSIS — H43813 Vitreous degeneration, bilateral: Secondary | ICD-10-CM | POA: Diagnosis not present

## 2020-04-29 DIAGNOSIS — H31092 Other chorioretinal scars, left eye: Secondary | ICD-10-CM | POA: Diagnosis not present

## 2020-05-07 ENCOUNTER — Encounter: Payer: Self-pay | Admitting: Cardiovascular Disease

## 2020-05-07 ENCOUNTER — Other Ambulatory Visit: Payer: Self-pay

## 2020-05-07 ENCOUNTER — Ambulatory Visit: Payer: BC Managed Care – PPO | Admitting: Cardiovascular Disease

## 2020-05-07 DIAGNOSIS — I10 Essential (primary) hypertension: Secondary | ICD-10-CM

## 2020-05-07 DIAGNOSIS — R0789 Other chest pain: Secondary | ICD-10-CM

## 2020-05-07 DIAGNOSIS — E782 Mixed hyperlipidemia: Secondary | ICD-10-CM

## 2020-05-07 LAB — HEPATIC FUNCTION PANEL
ALT: 16 IU/L (ref 0–32)
AST: 21 IU/L (ref 0–40)
Albumin: 4.5 g/dL (ref 3.8–4.9)
Alkaline Phosphatase: 94 IU/L (ref 44–121)
Bilirubin Total: 0.5 mg/dL (ref 0.0–1.2)
Bilirubin, Direct: 0.16 mg/dL (ref 0.00–0.40)
Total Protein: 7.2 g/dL (ref 6.0–8.5)

## 2020-05-07 LAB — LIPID PANEL
Chol/HDL Ratio: 2.5 ratio (ref 0.0–4.4)
Cholesterol, Total: 160 mg/dL (ref 100–199)
HDL: 63 mg/dL (ref >39)
LDL Chol Calc (NIH): 68 mg/dL (ref 0–99)
Triglycerides: 176 mg/dL — ABNORMAL HIGH (ref 0–149)
VLDL Cholesterol Cal: 29 mg/dL (ref 5–40)

## 2020-05-07 NOTE — Assessment & Plan Note (Signed)
History of atypical chest pain improved since I last saw her.  She did have a coronary calcium score of 0 with some soft plaque in her RCA which did not appear to be hemodynamically significant by FFR analysis (0.93).

## 2020-05-07 NOTE — Assessment & Plan Note (Signed)
History of hyperlipidemia probably statin intolerant currently on Nexletol with an LDL that came down from 100-92 on 02/06/2020.  She does complain of some aches and pains in her legs.  She is not at goal for secondary prevention with an LDL target of less than 70.  She may be a candidate for a PCSK9.  I will refer her back to our Pharm.D. for further evaluation and treatment.

## 2020-05-07 NOTE — Progress Notes (Signed)
05/07/2020 Rebecca Pollard   March 12, 1961  [id number]  Primary Physician Percell Belt, DO Primary Cardiologist: Lorretta Harp MD Lupe Carney, [place]  HPI:  Rebecca Pollard is a 59 y.o.  mildly overweight married Caucasian female mother of 3 children who does not work and was referred by Dr. Raoul Pitch for cardiovascular evaluation because of chest pain.   I last saw her in the office 11/01/2019.  She does have a history of treated hypertension hyperlipidemia. There is no family history of heart disease patient never had a heart attack or stroke. She did have a GXT approxi-10 years ago for evaluation of chest pain which was negative. She does have polycythemia of unknown type and is currently being evaluated by hematology. She had new onset chest pain on 06/22/2019 with 2 episodes were fairly brief. She had recurrent episode the following day that awakened her from sleep rating to her left shoulder and on the 30th as well. She said no subsequent episodes.  She had a coronary CTA performed 08/14/2019 that showed a coronary calcium score of 0 with soft plaque in the mid RCA and FFR of 0.93 suggesting this was not significant.  In addition, she had a 2-week Zio patch that did not reveal any arrhythmias.  I did begin her on atorvastatin 40 mg a day because of an LDL of 183 seen on lipid profile performed 06/26/2019 with subsequent lipid profile performed 10/25/2019 revealing a decline in her LDL down to 100 although she does complain of symptoms compatible statin intolerance with arthralgias.  I think she would benefit from being on a PCSK9 although we will attempt to start her on rosuvastatin to see if she can tolerate this prior to initiating Repatha.  I referred her to our Pharm.D.'s to begin her on Nexletol.  She does still complain of some aches and pains in her legs.  Her LDL came down from 100 down to 92, still not at goal.  Her symptoms of atypical chest pain had improved since I last saw her.   Her blood pressure likewise is under better control.    Current Meds  Medication Sig  . amLODipine (NORVASC) 5 MG tablet Take 1 tablet (5 mg total) by mouth daily.  . Ascorbic Acid (VITAMIN C) 500 MG CAPS Take 2 tablets by mouth 2 (two) times daily. Pt takes 1 tablet  . aspirin 81 MG chewable tablet Chew 81 mg by mouth daily. Pt takes 1-2 tablets daily  . Bempedoic Acid (NEXLETOL) 180 MG TABS Take 180 mg by mouth daily.  . Biotin 1 MG CAPS Take 1 mg by mouth daily as needed.  Marland Kitchen co-enzyme Q-10 30 MG capsule Take 30 mg by mouth daily. Take 1 pill daily  . COLLAGEN PO Take by mouth.  . Estradiol 10 MCG TABS vaginal tablet Place 1 tablet (10 mcg total) vaginally 2 (two) times a week. Recommend once weekly, will increase to twice weekly if needed.  . montelukast (SINGULAIR) 10 MG tablet TAKE 1 TABLET BY MOUTH EVERYDAY AT BEDTIME  . Multiple Vitamins-Minerals (MULTIVITAMIN WITH MINERALS) tablet Take 1 tablet by mouth daily.  . polycarbophil (FIBERCON) 625 MG tablet   . Rosuvastatin Calcium 10 MG CPSP Take 10 mg by mouth in the morning.  . vitamin B-12 (CYANOCOBALAMIN) 100 MCG tablet Take 100 mcg by mouth daily.  Marland Kitchen VITAMIN D PO Take by mouth.     Allergies  Allergen Reactions  . Albuterol Sulfate Other (See Comments)  . Losartan  Decreased kidney fx  . Esomeprazole Palpitations  . Magnesium-Containing Compounds Palpitations  . Potassium-Containing Compounds Palpitations    Social History   Socioeconomic History  . Marital status: Married    Spouse name: Eddie Dibbles  . Number of children: 3  . Years of education: Not on file  . Highest education level: Not on file  Occupational History  . Occupation: Water quality scientist  Tobacco Use  . Smoking status: Never Smoker  . Smokeless tobacco: Never Used  Vaping Use  . Vaping Use: Never used  Substance and Sexual Activity  . Alcohol use: Not Currently    Comment: rare  . Drug use: No  . Sexual activity: Yes    Partners: Male     Birth control/protection: Other-see comments    Comment: 1st intercourse- 14, patners- 88, married- 84 yrs   Other Topics Concern  . Not on file  Social History Narrative   Married to [place]. 2 children, [place] and [place].    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    Drinks caffeine, herbal remedies, daily vitamin use.    Wears her seatbelt, smoke detector in the home, no firearms in the home.    Feels safe in her relationships.    Exercises 2x a week.    Social Determinants of Health   Financial Resource Strain: Not on file  Food Insecurity: Not on file  Transportation Needs: Not on file  Physical Activity: Not on file  Stress: Not on file  Social Connections: Not on file  Intimate Partner Violence: Not on file     Review of Systems: General: negative for chills, fever, night sweats or weight changes.  Cardiovascular: negative for chest pain, dyspnea on exertion, edema, orthopnea, palpitations, paroxysmal nocturnal dyspnea or shortness of breath Dermatological: negative for rash Respiratory: negative for cough or wheezing Urologic: negative for hematuria Abdominal: negative for nausea, vomiting, diarrhea, bright red blood per rectum, melena, or hematemesis Neurologic: negative for visual changes, syncope, or dizziness All other systems reviewed and are otherwise negative except as noted above.    Blood pressure 116/80, pulse (!) 53, height 5\' 5"  (1.651 m), weight 172 lb 3.2 oz (78.1 kg), last menstrual period 04/16/2016, SpO2 98 %.  General appearance: alert and no distress Neck: no adenopathy, no carotid bruit, no JVD, supple, symmetrical, trachea midline and thyroid not enlarged, symmetric, no tenderness/mass/nodules Lungs: clear to auscultation bilaterally Heart: regular rate and rhythm, S1, S2 normal, no murmur, click, rub or gallop Extremities: extremities normal, atraumatic, no cyanosis or edema Pulses: 2+ and symmetric Skin: Skin color, texture, turgor normal. No  rashes or lesions Neurologic: Alert and oriented X 3, normal strength and tone. Normal symmetric reflexes. Normal coordination and gait  EKG sinus bradycardia at 53 without ST or T wave changes.  Personally reviewed this EKG.  ASSESSMENT AND PLAN:   Essential hypertension History of essential hypertension a blood pressure measured today 116/80.  She is on amlodipine.  Hyperlipidemia History of hyperlipidemia probably statin intolerant currently on Nexletol with an LDL that came down from 100-92 on 02/06/2020.  She does complain of some aches and pains in her legs.  She is not at goal for secondary prevention with an LDL target of less than 70.  She may be a candidate for a PCSK9.  I will refer her back to our Pharm.D. for further evaluation and treatment.  Atypical chest pain History of atypical chest pain improved since I last saw her.  She did have a coronary calcium score of 0  with some soft plaque in her RCA which did not appear to be hemodynamically significant by FFR analysis (0.93).      Lorretta Harp MD FACP,FACC,FAHA, [facility] 05/07/2020 9:36 AM

## 2020-05-07 NOTE — Assessment & Plan Note (Signed)
History of essential hypertension a blood pressure measured today 116/80.  She is on amlodipine.

## 2020-05-07 NOTE — Patient Instructions (Signed)
Medication Instructions:  Your physician recommends that you continue on your current medications as directed. Please refer to the Current Medication list given to you today.  *If you need a refill on your cardiac medications before your next appointment, please call your pharmacy*   Lab Work: Your physician recommends that you have labs done today: lipid/ liver profile.  If you have labs (blood work) drawn today and your tests are completely normal, you will receive your results only by: Marland Kitchen MyChart Message (if you have MyChart) OR . A paper copy in the mail If you have any lab test that is abnormal or we need to change your treatment, we will call you to review the results.    Follow-Up: At [facility], you and your health needs are our priority.  As part of our continuing mission to provide you with exceptional heart care, we have created designated Provider Care Teams.  These Care Teams include your primary Cardiologist (physician) and Advanced Practice Providers (APPs -  Physician Assistants and Nurse Practitioners) who all work together to provide you with the care you need, when you need it.  We recommend signing up for the patient portal called "MyChart".  Sign up information is provided on this After Visit Summary.  MyChart is used to connect with patients for Virtual Visits (Telemedicine).  Patients are able to view lab/test results, encounter notes, upcoming appointments, etc.  Non-urgent messages can be sent to your provider as well.   To learn more about what you can do with MyChart, go to [email or web address].    Your next appointment:   6 month(s)  The format for your next appointment:   In Person  Provider:   Quay Burow, MD   Other Instructions Referral made to Weight management center- Dr. Leafy Ro [phone])  Pt will need an appointment with PharmD to start repatha.

## 2020-05-23 ENCOUNTER — Ambulatory Visit: Payer: BC Managed Care – PPO

## 2020-05-30 ENCOUNTER — Inpatient Hospital Stay: Payer: 59 | Attending: Hematology and Oncology

## 2020-05-30 ENCOUNTER — Other Ambulatory Visit: Payer: Self-pay

## 2020-05-30 ENCOUNTER — Other Ambulatory Visit: Payer: Self-pay | Admitting: *Deleted

## 2020-05-30 ENCOUNTER — Inpatient Hospital Stay: Payer: 59

## 2020-05-30 ENCOUNTER — Telehealth: Payer: Self-pay | Admitting: Cardiovascular Disease

## 2020-05-30 DIAGNOSIS — D751 Secondary polycythemia: Secondary | ICD-10-CM | POA: Insufficient documentation

## 2020-05-30 LAB — CBC WITH DIFFERENTIAL (CANCER CENTER ONLY)
Abs Immature Granulocytes: 0.01 10*3/uL (ref 0.00–0.07)
Basophils Absolute: 0.1 10*3/uL (ref 0.0–0.1)
Basophils Relative: 1 %
Eosinophils Absolute: 0.1 10*3/uL (ref 0.0–0.5)
Eosinophils Relative: 1 %
HCT: 41.1 % (ref 36.0–46.0)
Hemoglobin: 13.5 g/dL (ref 12.0–15.0)
Immature Granulocytes: 0 %
Lymphocytes Relative: 28 %
Lymphs Abs: 2.1 10*3/uL (ref 0.7–4.0)
MCH: 30.8 pg (ref 26.0–34.0)
MCHC: 32.8 g/dL (ref 30.0–36.0)
MCV: 93.6 fL (ref 80.0–100.0)
Monocytes Absolute: 0.7 10*3/uL (ref 0.1–1.0)
Monocytes Relative: 9 %
Neutro Abs: 4.6 10*3/uL (ref 1.7–7.7)
Neutrophils Relative %: 61 %
Platelet Count: 349 10*3/uL (ref 150–400)
RBC: 4.39 MIL/uL (ref 3.87–5.11)
RDW: 13.2 % (ref 11.5–15.5)
WBC Count: 7.6 10*3/uL (ref 4.0–10.5)
nRBC: 0 % (ref 0.0–0.2)

## 2020-05-30 NOTE — Telephone Encounter (Signed)
    Pt c/o medication issue:  1. Name of Medication: Repatha  2. How are you currently taking this medication (dosage and times per day)?   3. Are you having a reaction (difficulty breathing--STAT)?   4. What is your medication issue? Pt said she have question about this medication and also about her upcoming appt with pharmD

## 2020-05-30 NOTE — Telephone Encounter (Signed)
Attempted to call patient, unable to leave voicemail due to voicemail being full. Will try again at a later time.

## 2020-05-30 NOTE — Progress Notes (Signed)
Per Dr Pamelia Hoit, patient does not need phlebotomy today. Patient informed and verbalized understanding.

## 2020-05-31 ENCOUNTER — Telehealth: Payer: Self-pay | Admitting: Hematology and Oncology

## 2020-05-31 ENCOUNTER — Telehealth: Payer: Self-pay | Admitting: *Deleted

## 2020-05-31 NOTE — Telephone Encounter (Signed)
Attempt x2 to contact pt regarding phlebotomy schedule.  Per MD pt needing to return to the office in 3 months with labs to evaluate Erythrocytosis.  No answer and unable to LVM due to VM being full.

## 2020-05-31 NOTE — Telephone Encounter (Signed)
Routed to pharmacy team 

## 2020-05-31 NOTE — Telephone Encounter (Signed)
Called pt - no answer per 1/7 sch msg - mailed letter with apt date and time

## 2020-05-31 NOTE — Telephone Encounter (Signed)
No answer and unable to leave message. Mailbox full. Will try sending message via myChart.

## 2020-06-04 ENCOUNTER — Telehealth: Payer: Self-pay | Admitting: Pharmacist Clinician (PhC)/ Clinical Pharmacy Specialist

## 2020-06-04 ENCOUNTER — Encounter: Payer: 59 | Admitting: Pharmacist Clinician (PhC)/ Clinical Pharmacy Specialist

## 2020-06-04 ENCOUNTER — Other Ambulatory Visit: Payer: Self-pay

## 2020-06-04 NOTE — Telephone Encounter (Signed)
Patient was scheduled for CVRR appointment today, however most recent labs showed LDL at goal.  She was contacted both by phone and MyChart, however we were not able reach her.  She did not need appointment today.  She came in accompanied by her husband.  States has been taking rosuvastatin only randomly and had read about Nexlizet, wondering if that would work for her.  She was given samples and she should call back in 2-3 weeks if tolerated and we will change her prescription.  Patient voiced  Understanding.

## 2020-06-04 NOTE — Progress Notes (Signed)
06/04/2020 [name] 06-09-1960 [id number]   HPI:  Rebecca Pollard is a 60 y.o. female patient of Dr Gwenlyn Found, who presents today for a lipid clinic evaluation.  Her medical history is significant for unknown type polycythemia and ASCVD.   A recent coronary CT showed possible moderate plaque with 51-69% blockage.  Her baseline LDL was at 183.  She has tried atorvastatin in the past but had to discontinue due to myalgias.  She was then switched to rosuvastatin 10 mg, which she again did not tolerate.  She was asked to try cutting the dose in half, and to this point is still doing well with the 5 mg daily.  She has been on this dose for about 2 months now, having cut back in mid-July.  Her most recent labs were drawn in June, while on the atorvastatin 40 mg.    Today she is in the office to discuss options for lowering cholesterol.  We don't have current labs with the rosuvastatin 5 mg dose, so will have those drawn today.    Current Medications: rosuvastatin 5 mg, bempedoic acid 180 mg  Cholesterol Goals: LDL < 70   Intolerant/previously tried: atorvastatin 40 mg qd - myalgias; rosuvastatin 10 - myalgias  Family history: father with cabg in early 70's high cholesterol ; mother also with high cholesterol, no cardiac sx; no MI among siblings; 3 children (33,25,23)  Diet: mostly home cooked; previously vegan; tried Keto, but did not like; mother was dietary weight loss manager; not much meat; highly plant based, some poultry; no salt  Exercise: no regular exercise  Labs: 6/21:  TC 185, TG 111, HDL 65, LDL 100 (atorvastatin 40 mg)  2/21:  TC 290, TG 169, HDL 72.9, LDL 183 (baseline)   Current Outpatient Medications  Medication Sig Dispense Refill  . amLODipine (NORVASC) 5 MG tablet Take 1 tablet (5 mg total) by mouth daily. 90 tablet 1  . Ascorbic Acid (VITAMIN C) 500 MG CAPS Take 2 tablets by mouth 2 (two) times daily. Pt takes 1 tablet    . aspirin 81 MG chewable tablet Chew 81 mg by mouth  daily. Pt takes 1-2 tablets daily    . Bempedoic Acid (NEXLETOL) 180 MG TABS Take 180 mg by mouth daily. 90 tablet 3  . Biotin 1 MG CAPS Take 1 mg by mouth daily as needed.    Marland Kitchen co-enzyme Q-10 30 MG capsule Take 30 mg by mouth daily. Take 1 pill daily    . COLLAGEN PO Take by mouth.    . Estradiol 10 MCG TABS vaginal tablet Place 1 tablet (10 mcg total) vaginally 2 (two) times a week. Recommend once weekly, will increase to twice weekly if needed. 24 tablet 4  . montelukast (SINGULAIR) 10 MG tablet TAKE 1 TABLET BY MOUTH EVERYDAY AT BEDTIME 90 tablet 0  . Multiple Vitamins-Minerals (MULTIVITAMIN WITH MINERALS) tablet Take 1 tablet by mouth daily.    . polycarbophil (FIBERCON) 625 MG tablet     . Rosuvastatin Calcium 10 MG CPSP Take 10 mg by mouth in the morning.    . vitamin B-12 (CYANOCOBALAMIN) 100 MCG tablet Take 100 mcg by mouth daily.    Marland Kitchen VITAMIN D PO Take by mouth.     No current facility-administered medications for this visit.    Allergies  Allergen Reactions  . Albuterol Sulfate Other (See Comments)  . Losartan     Decreased kidney fx  . Esomeprazole Palpitations  . Magnesium-Containing Compounds Palpitations  . Potassium-Containing Compounds  Palpitations    Past Medical History:  Diagnosis Date  . Asthma   . Chronic bronchitis ([place])   . Chronic fatigue   . CKD (chronic kidney disease) stage 3, GFR 30-59 ml/min ([place]) 01/26/2019  . Fibromyalgia   . GERD (gastroesophageal reflux disease)   . Glaucoma   . Hypertension 2019  . IBS (irritable bowel syndrome)   . OSA (obstructive sleep apnea) 05/30/2019  . Osteopenia 09/2016   T score -1.3 FRAX 5.3%/0.3%  . Palpitations    echo and stress test normal 2015 per pt  . Secondary polycythemia 2020  . Urinary incontinence     Last menstrual period 04/16/2016.   No problem-specific Assessment & Plan notes found for this encounter.   Tommy Medal PharmD CPP [facility] [address]  [place], [place] [address] [phone]    This encounter was created in error - please disregard.

## 2020-06-20 ENCOUNTER — Other Ambulatory Visit: Payer: Self-pay

## 2020-06-20 DIAGNOSIS — E782 Mixed hyperlipidemia: Secondary | ICD-10-CM

## 2020-06-20 MED ORDER — NEXLETOL 180 MG PO TABS: 180.0000 mg | ORAL_TABLET | Freq: Every day | ORAL | 3 refills | Status: DC

## 2020-06-21 ENCOUNTER — Other Ambulatory Visit: Payer: Self-pay

## 2020-06-21 MED ORDER — NEXLIZET 180-10 MG PO TABS: 180.0000 mg | ORAL_TABLET | Freq: Once | ORAL | 3 refills | Status: DC

## 2020-06-21 NOTE — Progress Notes (Signed)
Spoke with pt on the phone after receiving a call from pt's hematology office at the cancer [facility] stating pt left a message with their office about cholesterol medication.  Upon further investigation into pt's chart pt was given a trail of 2-3 weeks of nexlizet. Pt states that our office accidentally sent in another prescription for nexletol and wanted to clear up the issue.  Correction made, nexletol was d/c from pt's medication list and prescription sent to pharmacy for nexlizet. Pt verbalizes understanding.

## 2020-07-19 ENCOUNTER — Encounter (INDEPENDENT_AMBULATORY_CARE_PROVIDER_SITE_OTHER): Payer: Self-pay

## 2020-07-30 ENCOUNTER — Telehealth: Payer: Self-pay | Admitting: Pharmacist Clinician (PhC)/ Clinical Pharmacy Specialist

## 2020-07-30 MED ORDER — ROSUVASTATIN CALCIUM 10 MG PO TABS: 10.0000 mg | ORAL_TABLET | Freq: Every day | ORAL | 3 refills | Status: DC

## 2020-07-30 NOTE — Telephone Encounter (Signed)
Patient is calling to talk with Erasmo Downer in regards to her medication. Please call back.

## 2020-07-30 NOTE — Telephone Encounter (Signed)
Returned call to patient.    Finished off atorvastatin and wants to be sure that rosuvastatin 10 mg is okay to start.   Assured patient that this would be fine.

## 2020-08-20 ENCOUNTER — Telehealth: Payer: Self-pay | Admitting: *Deleted

## 2020-08-20 NOTE — Telephone Encounter (Signed)
Received call from pt requesting advice from MD regarding phlebotomy being preformed at the [facility] today.  Per MD okay for pt to proceed with phlebotomy.  Pt verbalized understanding and appreciative of advice.

## 2020-08-23 ENCOUNTER — Other Ambulatory Visit: Payer: Self-pay | Admitting: *Deleted

## 2020-08-23 DIAGNOSIS — D751 Secondary polycythemia: Secondary | ICD-10-CM

## 2020-08-26 ENCOUNTER — Telehealth: Payer: Self-pay | Admitting: *Deleted

## 2020-08-26 ENCOUNTER — Other Ambulatory Visit: Payer: Self-pay | Admitting: *Deleted

## 2020-08-26 DIAGNOSIS — D751 Secondary polycythemia: Secondary | ICD-10-CM

## 2020-08-26 NOTE — Progress Notes (Signed)
Received call from pt requesting Vitamin B12 and ANA be drawn prior to f.u with MD.  MD notified, verbal orders received for lab work to be obtained.

## 2020-08-26 NOTE — Telephone Encounter (Signed)
Received VM from pt.  Attempt x1 to return call.  No answer, LVM to return call to the office.  

## 2020-08-27 ENCOUNTER — Other Ambulatory Visit: Payer: Self-pay

## 2020-08-27 ENCOUNTER — Inpatient Hospital Stay: Payer: 59 | Attending: Hematology and Oncology

## 2020-08-27 DIAGNOSIS — D751 Secondary polycythemia: Secondary | ICD-10-CM

## 2020-08-27 DIAGNOSIS — E7800 Pure hypercholesterolemia, unspecified: Secondary | ICD-10-CM | POA: Diagnosis not present

## 2020-08-27 DIAGNOSIS — G4730 Sleep apnea, unspecified: Secondary | ICD-10-CM | POA: Diagnosis not present

## 2020-08-27 DIAGNOSIS — R21 Rash and other nonspecific skin eruption: Secondary | ICD-10-CM | POA: Insufficient documentation

## 2020-08-27 DIAGNOSIS — R5383 Other fatigue: Secondary | ICD-10-CM | POA: Insufficient documentation

## 2020-08-27 LAB — CBC WITH DIFFERENTIAL (CANCER CENTER ONLY)
Abs Immature Granulocytes: 0.02 10*3/uL (ref 0.00–0.07)
Basophils Absolute: 0.1 10*3/uL (ref 0.0–0.1)
Basophils Relative: 1 %
Eosinophils Absolute: 0.2 10*3/uL (ref 0.0–0.5)
Eosinophils Relative: 2 %
HCT: 40.5 % (ref 36.0–46.0)
Hemoglobin: 12.9 g/dL (ref 12.0–15.0)
Immature Granulocytes: 0 %
Lymphocytes Relative: 22 %
Lymphs Abs: 2 10*3/uL (ref 0.7–4.0)
MCH: 30.9 pg (ref 26.0–34.0)
MCHC: 31.9 g/dL (ref 30.0–36.0)
MCV: 96.9 fL (ref 80.0–100.0)
Monocytes Absolute: 0.7 10*3/uL (ref 0.1–1.0)
Monocytes Relative: 7 %
Neutro Abs: 6.2 10*3/uL (ref 1.7–7.7)
Neutrophils Relative %: 68 %
Platelet Count: 310 10*3/uL (ref 150–400)
RBC: 4.18 MIL/uL (ref 3.87–5.11)
RDW: 13.1 % (ref 11.5–15.5)
WBC Count: 9.1 10*3/uL (ref 4.0–10.5)
nRBC: 0 % (ref 0.0–0.2)

## 2020-08-27 LAB — VITAMIN B12: Vitamin B-12: 623 pg/mL (ref 180–914)

## 2020-08-28 LAB — FERRITIN: Ferritin: 14 ng/mL (ref 11–307)

## 2020-08-28 LAB — FANA STAINING PATTERNS: Homogeneous Pattern: 1

## 2020-08-28 LAB — ANTINUCLEAR ANTIBODIES, IFA: ANA Ab, IFA: POSITIVE — AB

## 2020-08-28 LAB — IRON AND TIBC
Iron: 43 ug/dL (ref 41–142)
Saturation Ratios: 10 % — ABNORMAL LOW (ref 21–57)
TIBC: 435 ug/dL (ref 236–444)
UIBC: 392 ug/dL — ABNORMAL HIGH (ref 120–384)

## 2020-08-28 NOTE — Progress Notes (Signed)
Patient Care Team: Percell Belt, DO as PCP - General (Family Medicine) Michel Bickers, MD as Consulting Physician (Infectious Diseases) Nicholas Lose, MD as Consulting Physician (Hematology and Oncology) Mansouraty, Telford Nab., MD as Consulting Physician (Gastroenterology) Chesley Mires, MD as Consulting Physician (Pulmonary Disease)  DIAGNOSIS:    ICD-10-CM   1. Erythrocytosis  D75.1     CHIEF COMPLIANT: Follow-upof secondary erythrocytosis  INTERVAL HISTORY: Rebecca Pollard is a 60 y.o. with above-mentioned history of secondary erythrocytosis. Labs on 08/27/20 showed Hg 12.9, HCT 40.5, iron saturation 10%, ferritin 14. She presents to the clinic todayto review her labs.  She reports that lately she has been experiencing diffuse aches and pains and fatigue as well as rash on the face.  She asked [place] to obtain an ANA test and came back positive for a 1 and 80 titration homogeneous pattern suggestive of lupus.  She is here today accompanied by her husband.  She has not had any further problems with bruising or bleeding.  She does not have any problems with blood clots either.  ALLERGIES:  is allergic to albuterol sulfate, losartan, esomeprazole, magnesium-containing compounds, and potassium-containing compounds.  MEDICATIONS:  Current Outpatient Medications  Medication Sig Dispense Refill  . amLODipine (NORVASC) 5 MG tablet Take 1 tablet (5 mg total) by mouth daily. 90 tablet 1  . Ascorbic Acid (VITAMIN C) 500 MG CAPS Take 2 tablets by mouth 2 (two) times daily. Pt takes 1 tablet    . aspirin 81 MG chewable tablet Chew 81 mg by mouth daily. Pt takes 1-2 tablets daily    . Biotin 1 MG CAPS Take 1 mg by mouth daily as needed.    Marland Kitchen co-enzyme Q-10 30 MG capsule Take 30 mg by mouth daily. Take 1 pill daily    . COLLAGEN PO Take by mouth.    . Estradiol 10 MCG TABS vaginal tablet Place 1 tablet (10 mcg total) vaginally 2 (two) times a week. Recommend once weekly, will increase to twice weekly  if needed. 24 tablet 4  . montelukast (SINGULAIR) 10 MG tablet TAKE 1 TABLET BY MOUTH EVERYDAY AT BEDTIME 90 tablet 0  . Multiple Vitamins-Minerals (MULTIVITAMIN WITH MINERALS) tablet Take 1 tablet by mouth daily.    . polycarbophil (FIBERCON) 625 MG tablet     . rosuvastatin (CRESTOR) 10 MG tablet Take 1 tablet (10 mg total) by mouth daily. 90 tablet 3  . vitamin B-12 (CYANOCOBALAMIN) 100 MCG tablet Take 100 mcg by mouth daily.    Marland Kitchen VITAMIN D PO Take by mouth.     No current facility-administered medications for this visit.    PHYSICAL EXAMINATION: ECOG PERFORMANCE STATUS: 1 - Symptomatic but completely ambulatory  Vitals:   08/29/20 1538  BP: 136/90  Pulse: 80  Resp: 20  Temp: 98.1 F (36.7 C)  SpO2: 98%   Filed Weights   08/29/20 1538  Weight: 176 lb 11.2 oz (80.2 kg)     LABORATORY DATA:  I have reviewed the data as listed CMP Latest Ref Rng & Units 05/07/2020 02/06/2020 10/25/2019  Glucose 70 - 99 mg/dL - - -  BUN 6 - 23 mg/dL - - -  Creatinine 0.44 - 1.00 mg/dL - - -  Sodium 135 - 145 mEq/L - - -  Potassium 3.5 - 5.1 mEq/L - - -  Chloride 96 - 112 mEq/L - - -  CO2 19 - 32 mEq/L - - -  Calcium 8.4 - 10.5 mg/dL - - -  Total Protein  6.0 - 8.5 g/dL 7.2 7.4 7.4  Total Bilirubin 0.0 - 1.2 mg/dL 0.5 0.3 0.5  Alkaline Phos 44 - 121 IU/L 94 137(H) 131(H)  AST 0 - 40 IU/L 21 18 20   ALT 0 - 32 IU/L 16 16 14     Lab Results  Component Value Date   WBC 9.1 08/27/2020   HGB 12.9 08/27/2020   HCT 40.5 08/27/2020   MCV 96.9 08/27/2020   PLT 310 08/27/2020   NEUTROABS 6.2 08/27/2020    ASSESSMENT & PLAN:  Erythrocytosis 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  Lab review:  08/27/2020 hemoglobin is 12.9 (previously was 15.7)   Sleep apnea:  She is not using any CPAP machine at  this time. Hypercholesterolemia: On Crestor  Positive ANA with symptoms of rash and fatigue: I sent a referral to rheumatology for evaluation. Return to clinic in 1 year for labs and follow-up.   No orders of the defined types were placed in this encounter.  The patient has a good understanding of the overall plan. she agrees with it. she will call with any problems that may develop before the next visit here.  Total time spent: 20 mins including face to face time and time spent for planning, charting and coordination of care  Rulon Eisenmenger, MD, MPH 08/29/2020  I, Molly Dorshimer, am acting as scribe for Dr. Nicholas Lose.  I have reviewed the above documentation for accuracy and completeness, and I agree with the above.

## 2020-08-29 ENCOUNTER — Other Ambulatory Visit: Payer: Self-pay

## 2020-08-29 ENCOUNTER — Encounter: Payer: Self-pay | Admitting: *Deleted

## 2020-08-29 ENCOUNTER — Inpatient Hospital Stay (HOSPITAL_BASED_OUTPATIENT_CLINIC_OR_DEPARTMENT_OTHER): Payer: 59 | Admitting: Hematology and Oncology

## 2020-08-29 DIAGNOSIS — D751 Secondary polycythemia: Secondary | ICD-10-CM

## 2020-08-29 NOTE — Progress Notes (Signed)
Per MD request, RN successfully faxed referral to Dr. Gavin Pound with [facility] [phone]).

## 2020-08-29 NOTE — Assessment & Plan Note (Signed)
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  Lab review: Hemoglobin is 14.7 improved from 15.7 Current treatment: Patient was given blood donations every 3 months Sleep apnea: Following with her PCP Hypercholesterolemia: On a new cholesterol medication.  Return to clinic in 1 year for labs and follow-up.

## 2020-09-03 ENCOUNTER — Encounter: Payer: Self-pay | Admitting: Obstetrics & Gynecology

## 2020-09-03 ENCOUNTER — Ambulatory Visit: Payer: 59 | Admitting: Obstetrics & Gynecology

## 2020-09-03 ENCOUNTER — Other Ambulatory Visit: Payer: Self-pay

## 2020-09-03 VITALS — BP 126/84 | Ht 64.5 in | Wt 176.4 lb

## 2020-09-03 DIAGNOSIS — N952 Postmenopausal atrophic vaginitis: Secondary | ICD-10-CM | POA: Diagnosis not present

## 2020-09-03 DIAGNOSIS — M8589 Other specified disorders of bone density and structure, multiple sites: Secondary | ICD-10-CM

## 2020-09-03 DIAGNOSIS — Z780 Asymptomatic menopausal state: Secondary | ICD-10-CM

## 2020-09-03 DIAGNOSIS — Z01419 Encounter for gynecological examination (general) (routine) without abnormal findings: Secondary | ICD-10-CM

## 2020-09-03 DIAGNOSIS — E663 Overweight: Secondary | ICD-10-CM

## 2020-09-03 MED ORDER — ESTRADIOL 0.1 MG/GM VA CREA: 1.0000 | TOPICAL_CREAM | VAGINAL | 4 refills | Status: DC

## 2020-09-03 MED ORDER — ESTRADIOL 0.1 MG/GM VA CREA: 1.0000 | TOPICAL_CREAM | Freq: Every day | VAGINAL | 12 refills | Status: DC

## 2020-09-03 NOTE — Progress Notes (Signed)
Rebecca Pollard [birth year] [id number]   History:    60 y.o. G3P3L3 Married.  OE:[id number] presenting for annual gyn exam   RCV:[id number], well on no HRT since stopped her Combipatch in 2020. No PMB. No pelvic pain. Rarely sexually active, some dryness on Vagifem twice a week. Urine/BMs wnl. Breasts wnl. Screening Mammo 03/2019 Negative, will schedule.  BMI 29.81.  Health labs with Fam MD.  Treated for hyperlipidemia and cHTN.  Polycythemia resolved.  Conono 2020.  BD 09/2019 Osteopenia.   Past medical history,surgical history, family history and social history were all reviewed and documented in the EPIC chart.  Gynecologic History Patient's last menstrual period was 04/16/2016 (approximate).  Obstetric History OB History  Gravida Para Term Preterm AB Living  3 3       3   SAB IAB Ectopic Multiple Live Births               # Outcome Date GA Lbr Len/2nd Weight Sex Delivery Anes PTL Lv  3 Para           2 Para           1 Para              ROS: A ROS was performed and pertinent positives and negatives are included in the history.  GENERAL: No fevers or chills. HEENT: No change in vision, no earache, sore throat or sinus congestion. NECK: No pain or stiffness. CARDIOVASCULAR: No chest pain or pressure. No palpitations. PULMONARY: No shortness of breath, cough or wheeze. GASTROINTESTINAL: No abdominal pain, nausea, vomiting or diarrhea, melena or bright red blood per rectum. GENITOURINARY: No urinary frequency, urgency, hesitancy or dysuria. MUSCULOSKELETAL: No joint or muscle pain, no back pain, no recent trauma. DERMATOLOGIC: No rash, no itching, no lesions. ENDOCRINE: No polyuria, polydipsia, no heat or cold intolerance. No recent change in weight. HEMATOLOGICAL: No anemia or easy bruising or bleeding. NEUROLOGIC: No headache, seizures, numbness, tingling or weakness. PSYCHIATRIC: No depression, no loss of interest in normal activity or change in sleep pattern.      Exam:   BP 126/84   Ht 5' 4.5" (1.638 m)   Wt 176 lb 6.4 oz (80 kg)   LMP 04/16/2016 (Approximate)   BMI 29.81 kg/m   Body mass index is 29.81 kg/m.  General appearance : Well developed well nourished female. No acute distress HEENT: Eyes: no retinal hemorrhage or exudates,  Neck supple, trachea midline, no carotid bruits, no thyroidmegaly Lungs: Clear to auscultation, no rhonchi or wheezes, or rib retractions  Heart: Regular rate and rhythm, no murmurs or gallops Breast:Examined in sitting and supine position were symmetrical in appearance, no palpable masses or tenderness,  no skin retraction, no nipple inversion, no nipple discharge, no skin discoloration, no axillary or supraclavicular lymphadenopathy Abdomen: no palpable masses or tenderness, no rebound or guarding Extremities: no edema or skin discoloration or tenderness  Pelvic: Vulva: Normal             Vagina: No gross lesions or discharge  Cervix: No gross lesions or discharge  Uterus AV, normal size, shape and consistency, non-tender and mobile  Adnexa  Without masses or tenderness  Anus: Normal   Assessment/Plan:  60 y.o. female for annual exam   1. Well female exam with routine gynecological exam Normal gynecologic exam in menopause.  No indication for Pap test this year, Pap test negative in 2021.  Breast exam normal.  Patient will schedule a screening mammogram now.  Colonoscopy negative  in 2020.  Health labs with family physician.  2. Postmenopausal Well on no systemic hormone replacement therapy.  No postmenopausal bleeding.  3. Postmenopausal atrophic vaginitis Still experiencing dryness with Vagifem.  Decided to switch to estradiol cream 1 applicator twice a week.  No contraindication to estradiol cream.  Usage reviewed with patient.  Prescription sent to pharmacy.  4. Osteopenia of multiple sites Osteopenia on bone density in May 2021.  We will repeat in May 2023.  Vitamin D supplements, calcium  intake of 1.5 g/day total and regular weightbearing physical activity is recommended.  5. Overweight (BMI 25.0-29.9) Lower calorie/carb diet recommended.  Intermittent fasting discussed.  Aerobic activities 5 times a week and light weightlifting every 2 days.  Other orders - Bempedoic Acid-Ezetimibe (NEXLIZET) 180-10 MG TABS; Take by mouth. - estradiol (ESTRACE) 0.1 MG/GM vaginal cream; Place 1 Applicatorful vaginally 2 (two) times a week.  Princess Bruins MD, 8:52 AM 09/03/2020

## 2020-09-30 IMAGING — DX CHEST - 2 VIEW
2 series · 2 of 2 positions shown · non-contrast
Comparison: Chest x-ray dated 02/27/2016.

CLINICAL DATA: Cough and congestion for months. Some chest pain and
tightness.

EXAM:
CHEST - 2 VIEW

[chest pa]
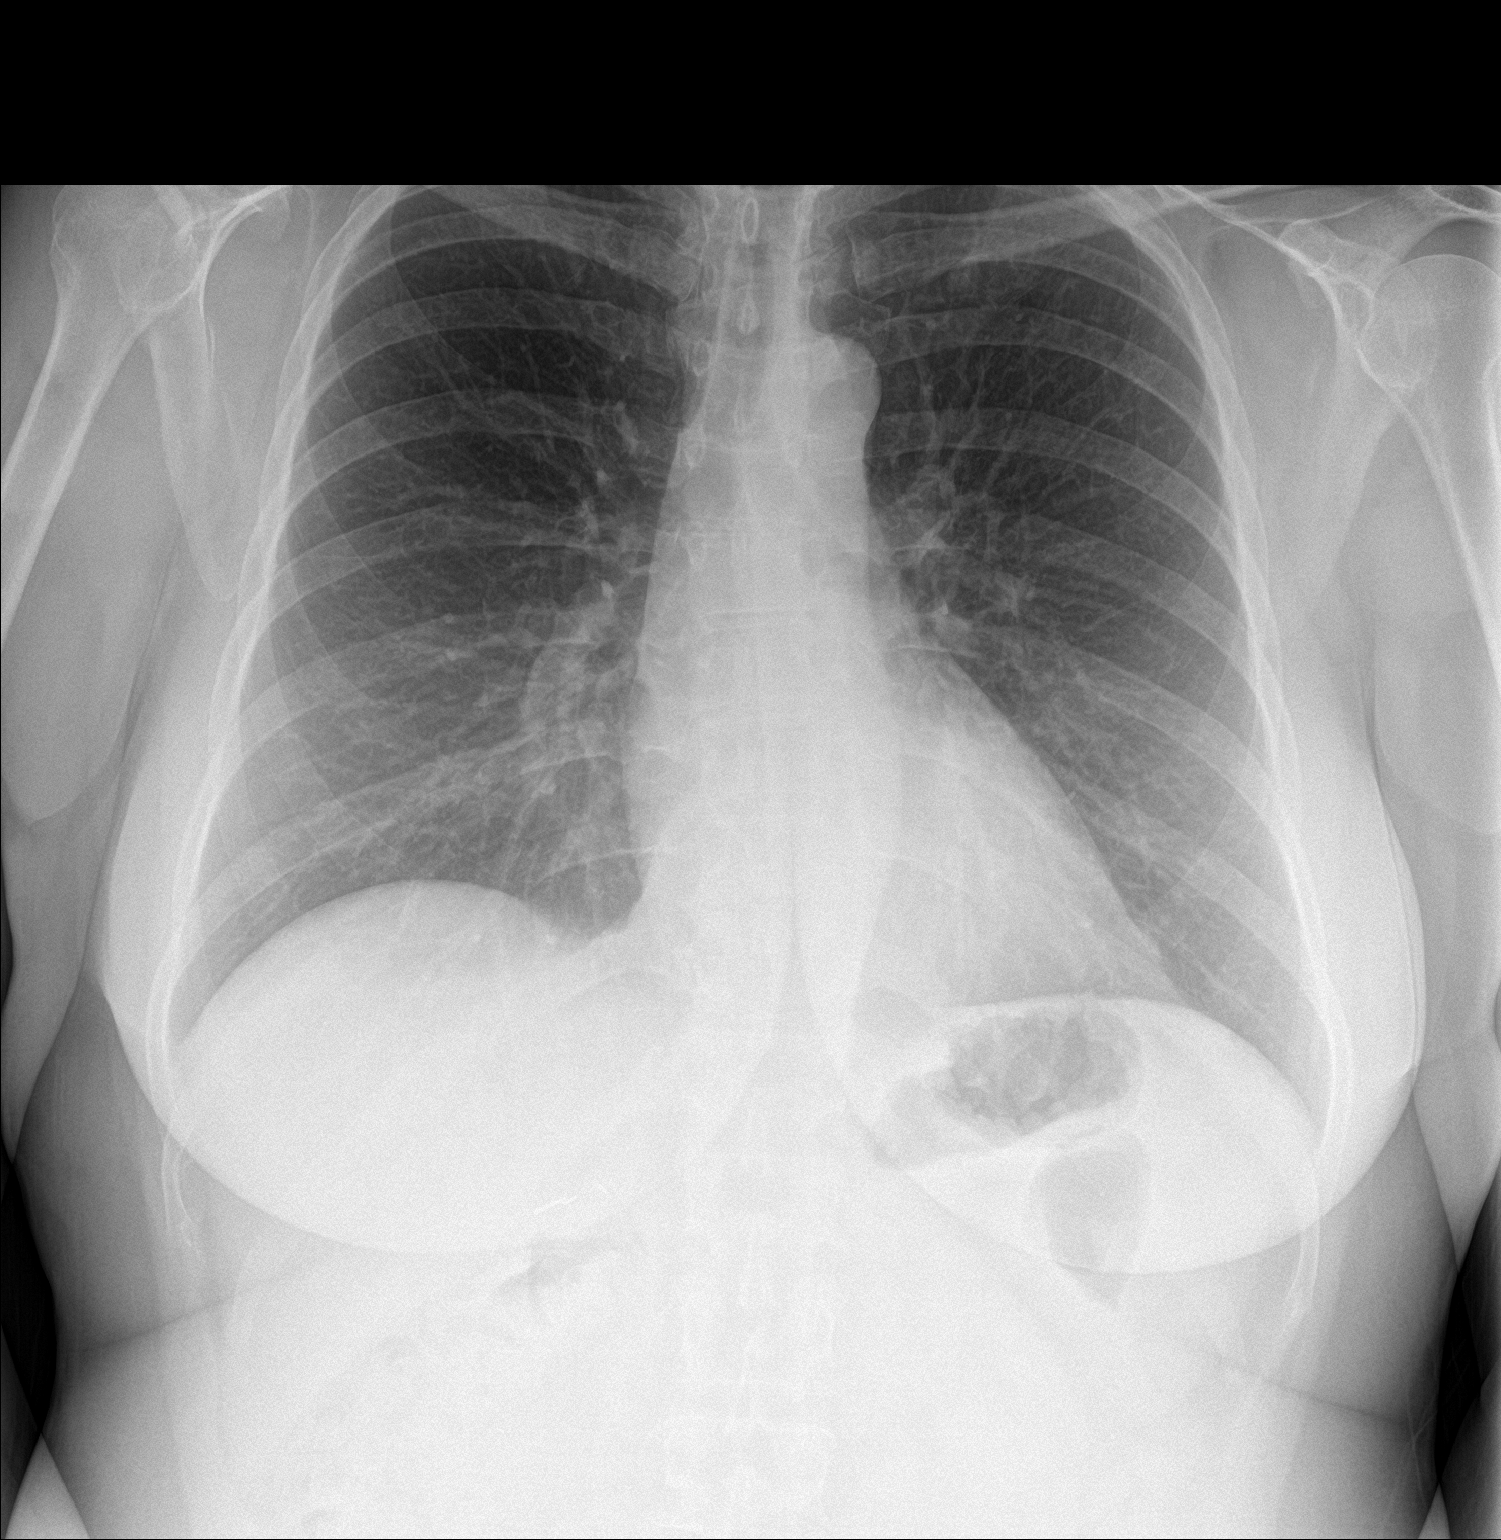

[chest lat]
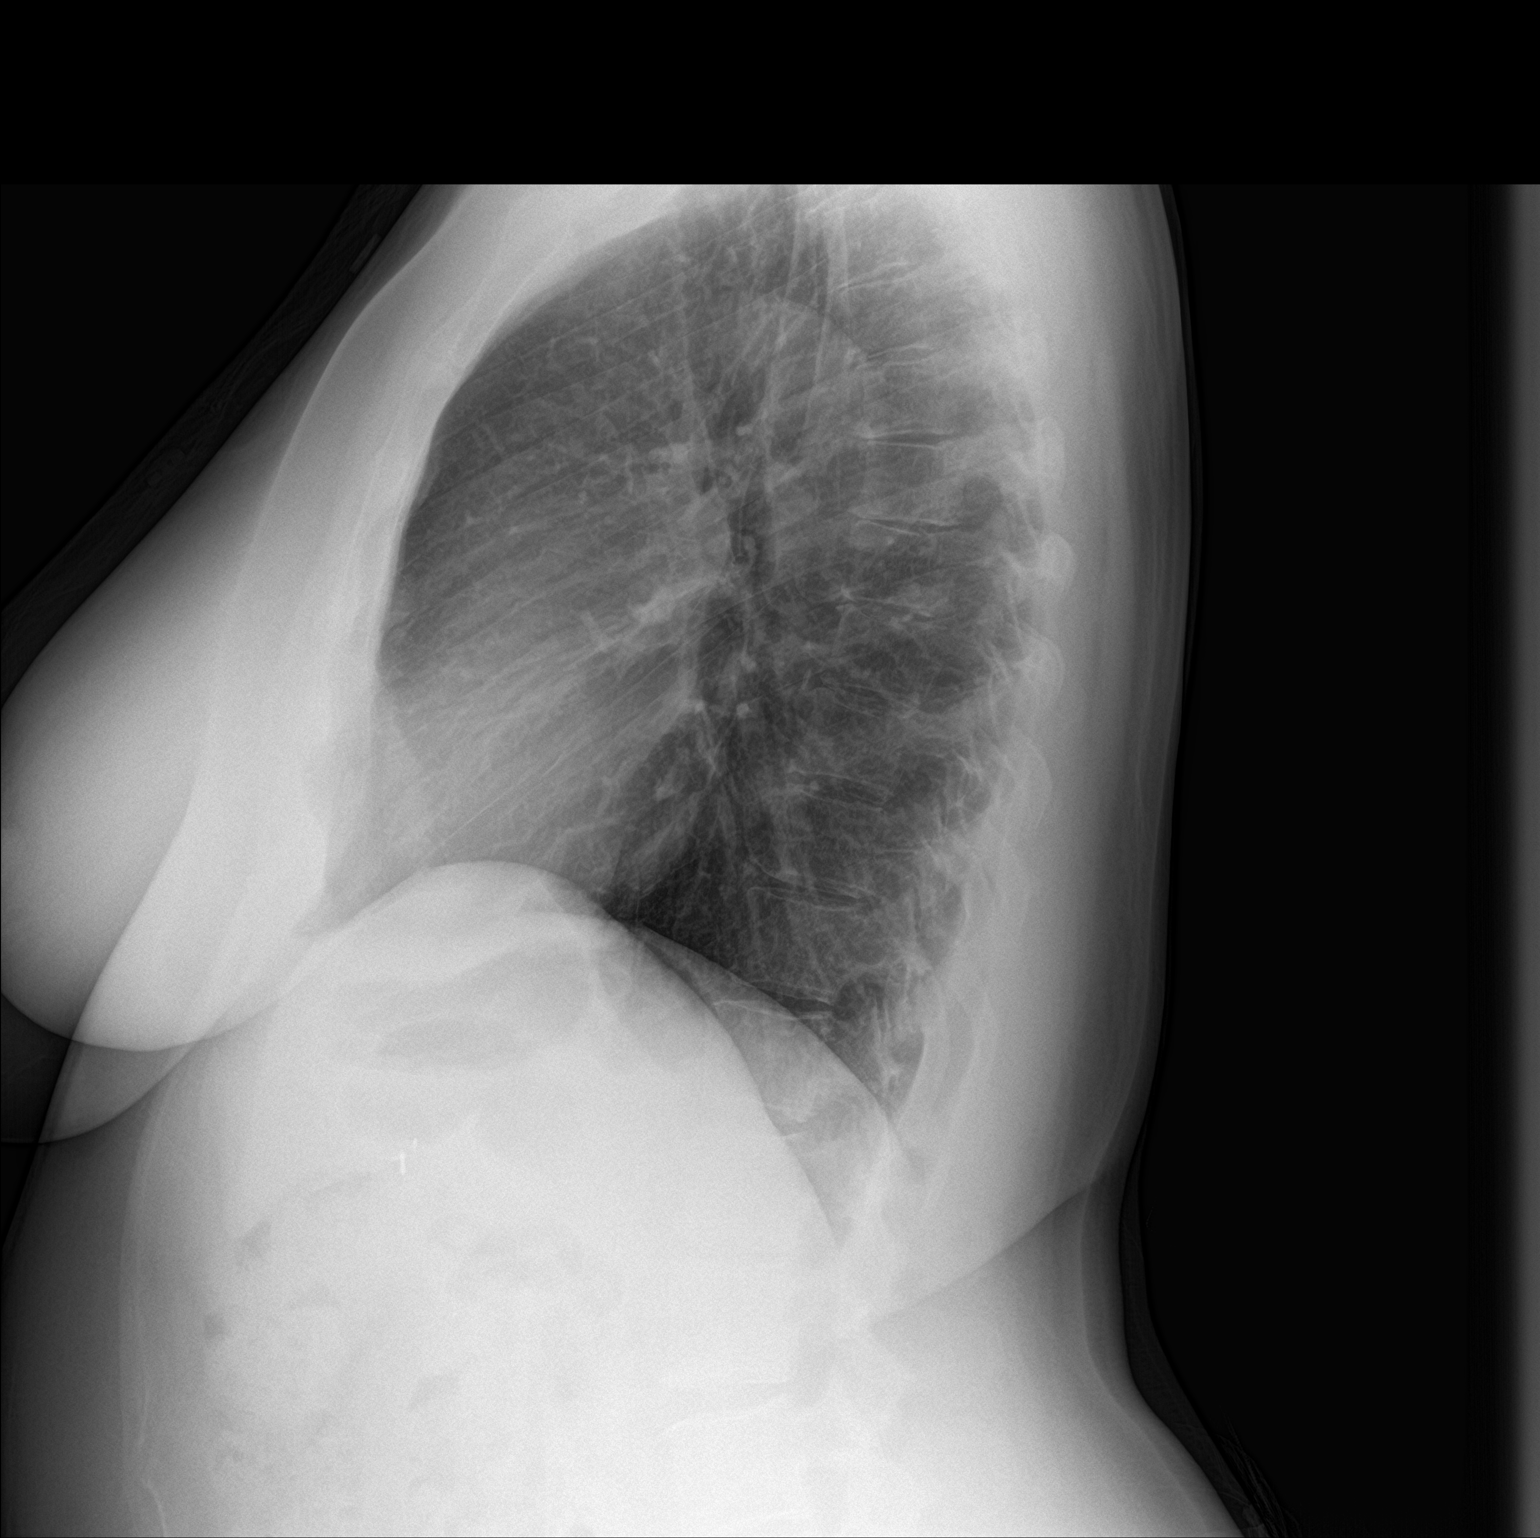

[2 of 2 positions shown; findings below may reference images not displayed]

FINDINGS: Cardiomediastinal silhouette is within normal limits in size and
configuration. Lungs are clear. Lung volumes are normal. No evidence
of pneumonia. No pleural effusion. No pneumothorax seen. Osseous
structures about the chest are unremarkable.
IMPRESSION: No active cardiopulmonary disease. No evidence of pneumonia or
pulmonary edema.

## 2020-10-24 ENCOUNTER — Other Ambulatory Visit: Payer: Self-pay | Admitting: Obstetrics & Gynecology

## 2020-10-30 ENCOUNTER — Encounter: Payer: Self-pay | Admitting: Cardiovascular Disease

## 2020-10-30 ENCOUNTER — Other Ambulatory Visit: Payer: Self-pay

## 2020-10-30 ENCOUNTER — Ambulatory Visit: Payer: BC Managed Care – PPO | Admitting: Cardiovascular Disease

## 2020-10-30 VITALS — BP 118/76 | HR 64 | Ht 65.0 in | Wt 176.0 lb

## 2020-10-30 DIAGNOSIS — R002 Palpitations: Secondary | ICD-10-CM | POA: Diagnosis not present

## 2020-10-30 DIAGNOSIS — R0789 Other chest pain: Secondary | ICD-10-CM | POA: Diagnosis not present

## 2020-10-30 DIAGNOSIS — E782 Mixed hyperlipidemia: Secondary | ICD-10-CM

## 2020-10-30 DIAGNOSIS — I10 Essential (primary) hypertension: Secondary | ICD-10-CM | POA: Diagnosis not present

## 2020-10-30 NOTE — Assessment & Plan Note (Signed)
History of essential hypertension a blood pressure measured today at 118/76.  She is on amlodipine.

## 2020-10-30 NOTE — Assessment & Plan Note (Signed)
History of hyperlipidemia on statin therapy until recently when she discontinued it because of arthritic symptoms which resolved.  Her most recent lipid profile performed 05/07/2020 revealed a total cholesterol 160, LDL of 68 and HDL of 63.  She was on NEXLIZETand Crestor.  Given her soft plaque in her coronary arteries I am going to put her on Repatha and recheck a lipid profile in 2 to 3 months.

## 2020-10-30 NOTE — Progress Notes (Signed)
10/30/2020 Rebecca Pollard   1960/11/08  [id number]  Primary Physician Rebecca Belt, DO Primary Cardiologist: Lorretta Harp MD Rebecca Pollard, [place]  HPI:  Rebecca Pollard is a 60 y.o.  mildly overweight married Caucasian female mother of 3 children who does not work and was referred by Dr. Raoul Pitch for cardiovascular evaluation because of chest pain.I last saw her in the office  05/07/2020. She does have a history of treated hypertension hyperlipidemia. There is no family history of heart disease patient never had a heart attack or stroke. She did have a GXT approxi-10 years ago for evaluation of chest pain which was negative. She does have polycythemia of unknown type and is currently being evaluated by hematology. She had new onset chest pain on 06/22/2019 with 2 episodes were fairly brief. She had recurrent episode the following day that awakened her from sleep rating to her left shoulder and on the 30th as well. She said no subsequent episodes.  She had a coronary CTA performed 08/14/2019 that showed a coronary calcium score of 0 with soft plaque in the mid RCA and FFR of 0.93 suggesting this was not significant. In addition, she had a 2-week Zio patch that did not reveal any arrhythmias. I did begin her on atorvastatin 40 mg a day because of an LDL of 183 seen on lipid profile performed 06/26/2019 with subsequent lipid profile performed 10/25/2019 revealing a decline in her LDL down to 100 although she does complain of symptoms compatible statin intolerance with arthralgias. I think she would benefit from being on a PCSK9 although we will attempt to start her on rosuvastatin to see if she can tolerate this prior to initiating Repatha.  I referred her to our Pharm.D.'s to begin her on Nexletol.    Her most recent lipid profile performed 05/07/2020 revealed total cholesterol 160, LDL of 68 and HDL 63.  She did stop her medications because of arthritic symptoms which resolved.  She is  active and denies chest pain or shortness of breath.  I think she would be a good candidate to finally initiate Repatha.  Current Meds  Medication Sig  . amLODipine (NORVASC) 5 MG tablet Take 1 tablet (5 mg total) by mouth daily.  . Ascorbic Acid (VITAMIN C) 500 MG CAPS Take 2 tablets by mouth 2 (two) times daily. Pt takes 1 tablet  . aspirin 81 MG chewable tablet Chew 81 mg by mouth daily. Pt takes 1-2 tablets daily  . Bempedoic Acid-Ezetimibe (NEXLIZET) 180-10 MG TABS Take by mouth.  . co-enzyme Q-10 30 MG capsule Take 30 mg by mouth daily. Take 1 pill daily  . COLLAGEN PO Take by mouth.  . estradiol (ESTRACE) 0.1 MG/GM vaginal cream Place 1 Applicatorful vaginally 2 (two) times a week.  . montelukast (SINGULAIR) 10 MG tablet TAKE 1 TABLET BY MOUTH EVERYDAY AT BEDTIME  . Multiple Vitamins-Minerals (MULTIVITAMIN WITH MINERALS) tablet Take 1 tablet by mouth daily.  . polycarbophil (FIBERCON) 625 MG tablet   . vitamin B-12 (CYANOCOBALAMIN) 100 MCG tablet Take 100 mcg by mouth daily.  Marland Kitchen VITAMIN D PO Take by mouth.     Allergies  Allergen Reactions  . Albuterol Sulfate Other (See Comments)  . Losartan     Decreased kidney fx  . Esomeprazole Palpitations  . Magnesium-Containing Compounds Palpitations  . Potassium-Containing Compounds Palpitations    Social History   Socioeconomic History  . Marital status: Married    Spouse name: Rebecca Pollard  . Number of children:  3  . Years of education: Not on file  . Highest education level: Not on file  Occupational History  . Occupation: Water quality scientist  Tobacco Use  . Smoking status: Never Smoker  . Smokeless tobacco: Never Used  Vaping Use  . Vaping Use: Never used  Substance and Sexual Activity  . Alcohol use: Not Currently    Comment: rare  . Drug use: No  . Sexual activity: Yes    Partners: Male    Birth control/protection: Other-see comments    Comment: 1st intercourse- 68, patners- 64, married- 48 yrs   Other Topics Concern  .  Not on file  Social History Narrative   Married to [place]. 2 children, Rebecca Pollard and Rebecca Pollard.    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    Drinks caffeine, herbal remedies, daily vitamin use.    Wears her seatbelt, smoke detector in the home, no firearms in the home.    Feels safe in her relationships.    Exercises 2x a week.    Social Determinants of Health   Financial Resource Strain: Not on file  Food Insecurity: Not on file  Transportation Needs: Not on file  Physical Activity: Not on file  Stress: Not on file  Social Connections: Not on file  Intimate Partner Violence: Not on file     Review of Systems: General: negative for chills, fever, night sweats or weight changes.  Cardiovascular: negative for chest pain, dyspnea on exertion, edema, orthopnea, palpitations, paroxysmal nocturnal dyspnea or shortness of breath Dermatological: negative for rash Respiratory: negative for cough or wheezing Urologic: negative for hematuria Abdominal: negative for nausea, vomiting, diarrhea, bright red blood per rectum, melena, or hematemesis Neurologic: negative for visual changes, syncope, or dizziness All other systems reviewed and are otherwise negative except as noted above.    Blood pressure 118/76, pulse 64, height 5\' 5"  (1.651 m), weight 176 lb (79.8 kg), last menstrual period 04/16/2016.  General appearance: alert and no distress Neck: no adenopathy, no carotid bruit, no JVD, supple, symmetrical, trachea midline and thyroid not enlarged, symmetric, no tenderness/mass/nodules Lungs: clear to auscultation bilaterally Heart: regular rate and rhythm, S1, S2 normal, no murmur, click, rub or gallop Extremities: extremities normal, atraumatic, no cyanosis or edema Pulses: 2+ and symmetric Skin: Skin color, texture, turgor normal. No rashes or lesions Neurologic: Alert and oriented X 3, normal strength and tone. Normal symmetric reflexes. Normal coordination and gait  EKG sinus rhythm at  64 with poor R wave progression and low limb voltage.  I personally reviewed this EKG.  ASSESSMENT AND PLAN:   Essential hypertension History of essential hypertension a blood pressure measured today at 118/76.  She is on amlodipine.  Hyperlipidemia History of hyperlipidemia on statin therapy until recently when she discontinued it because of arthritic symptoms which resolved.  Her most recent lipid profile performed 05/07/2020 revealed a total cholesterol 160, LDL of 68 and HDL of 63.  She was on NEXLIZETand Crestor.  Given her soft plaque in her coronary arteries I am going to put her on Repatha and recheck a lipid profile in 2 to 3 months.  Atypical chest pain History of atypical chest pain in the past with a coronary calcium score of 0 and CTA performed 08/14/2019 revealing soft plaque in the mid RCA with an FFR of 0.93.  She no longer has chest pain.  Palpitations History of palpitations in the past with Zio patch that showed no significant arrhythmias.  She has had no further palpitations.  Lorretta Harp MD FACP,FACC,FAHA, [facility] 10/30/2020 9:11 AM

## 2020-10-30 NOTE — Patient Instructions (Signed)
Medication Instructions:  Dr. Gwenlyn Found recommends Repatha 140mg /mL or Praluent 150mg /mL (PCSK9). This is an injectable cholesterol medication self-administered once every 14 days. This medication will likely need prior approval with your insurance company, which we will work on. If the medication is not approved initially, we may need to do an appeal with your insurance.   Administer medication in area of fatty tissue such as abdomen, outer thigh, back of upper arm - and rotate site with each injection Store medication in refrigerator until ready to administer - allow to sit at room temp for 30 mins - 1 hour prior to injection Dispose of medication in a SHARPS container - your pharmacy should be able to direct you on this and proper disposal   If you need a co-pay card for Repatha: [email or web address] >> paying for Repatha or red box that says "[place]" in top right If you need a co-pay card for Praluent: WedMap.it >> starting & paying for Praluent  *If you need a refill on your cardiac medications before your next appointment, please call your pharmacy*   Lab Work: FASTING lab work in 2-3 months -- lipid panel, liver function panel  If you have labs (blood work) drawn today and your tests are completely normal, you will receive your results only by: Marland Kitchen MyChart Message (if you have MyChart) OR . A paper copy in the mail If you have any lab test that is abnormal or we need to change your treatment, we will call you to review the results.   Testing/Procedures: NONE   Follow-Up: At [facility], you and your health needs are our priority.  As part of our continuing mission to provide you with exceptional heart care, we have created designated Provider Care Teams.  These Care Teams include your primary Cardiologist (physician) and Advanced Practice Providers (APPs -  Physician Assistants and Nurse Practitioners) who all work together to provide you with the care you need, when you need  it.  We recommend signing up for the patient portal called "MyChart".  Sign up information is provided on this After Visit Summary.  MyChart is used to connect with patients for Virtual Visits (Telemedicine).  Patients are able to view lab/test results, encounter notes, upcoming appointments, etc.  Non-urgent messages can be sent to your provider as well.   To learn more about what you can do with MyChart, go to [email or web address].    Your next appointment:   12 month(s)  The format for your next appointment:   In Person  Provider:   You may see Dr. Gwenlyn Found or one of the following Advanced Practice Providers on your designated Care Team:    [place], PA-C  Coletta Memos, FNP    Other Instructions

## 2020-10-30 NOTE — Assessment & Plan Note (Signed)
History of atypical chest pain in the past with a coronary calcium score of 0 and CTA performed 08/14/2019 revealing soft plaque in the mid RCA with an FFR of 0.93.  She no longer has chest pain.

## 2020-10-30 NOTE — Assessment & Plan Note (Signed)
History of palpitations in the past with Zio patch that showed no significant arrhythmias.  She has had no further palpitations.

## 2020-10-31 ENCOUNTER — Telehealth: Payer: Self-pay

## 2020-10-31 DIAGNOSIS — E782 Mixed hyperlipidemia: Secondary | ICD-10-CM

## 2020-10-31 MED ORDER — PRALUENT 150 MG/ML ~~LOC~~ SOAJ: 150.0000 mg | SUBCUTANEOUS | 11 refills | Status: DC

## 2020-10-31 NOTE — Telephone Encounter (Signed)
Lmom pt to start praluent 150mg  and instructed pt to complete fasting labs post 4th dose and to call back if unaffordable

## 2020-11-01 ENCOUNTER — Telehealth: Payer: Self-pay | Admitting: Cardiovascular Disease

## 2020-11-01 ENCOUNTER — Telehealth: Payer: Self-pay | Admitting: *Deleted

## 2020-11-01 DIAGNOSIS — D751 Secondary polycythemia: Secondary | ICD-10-CM

## 2020-11-01 NOTE — Telephone Encounter (Signed)
Received call from pt with complaints of fatigue.  Pt states during last visit with MD she was told she is borderline anemic but educated to not start iron supplements at this time due to hx of erythrocytosis.  Pt requesting labs and MD f.u to review iron studies and decide if pt needs to begin iron supplements vs phlebotomies for erythrocytosis.  Apts scheduled and pt verbalized understanding of date and time.

## 2020-11-01 NOTE — Telephone Encounter (Signed)
Will route to [facility] to advise.   Thanks!

## 2020-11-01 NOTE — Telephone Encounter (Signed)
Pt c/o medication issue:  1. Name of Medication: Alirocumab (PRALUENT) 150 MG/ML SOAJ  2. How are you currently taking this medication (dosage and times per day)? Patient has not started yet   3. Are you having a reaction (difficulty breathing--STAT)? no  4. What is your medication issue? Patient wanted to know if it was normal to start on the higher dose of the medication. She read that there is a 75 mg injection and just wanted to make sure she will be on the right dose

## 2020-11-01 NOTE — Telephone Encounter (Signed)
We almost always use the higher dose of Praluent (150 mg).  Only use the 75 mg for patients who start with LDL between 70-100.  Your LDL started in the 150 range, so you are on the correct dose

## 2020-11-01 NOTE — Telephone Encounter (Signed)
Called patient, advised of message from PharmD.  Patient verbalized understanding.   She is still waiting for the pharmacy for the medication- and as soon as she gets this she will start.

## 2020-11-02 ENCOUNTER — Other Ambulatory Visit: Payer: Self-pay | Admitting: Pulmonary Disease

## 2020-11-04 ENCOUNTER — Inpatient Hospital Stay: Payer: 59 | Attending: Hematology and Oncology

## 2020-11-04 ENCOUNTER — Other Ambulatory Visit: Payer: Self-pay

## 2020-11-04 DIAGNOSIS — D751 Secondary polycythemia: Secondary | ICD-10-CM | POA: Diagnosis not present

## 2020-11-04 LAB — CBC WITH DIFFERENTIAL (CANCER CENTER ONLY)
Abs Immature Granulocytes: 0.02 10*3/uL (ref 0.00–0.07)
Basophils Absolute: 0.1 10*3/uL (ref 0.0–0.1)
Basophils Relative: 1 %
Eosinophils Absolute: 0.1 10*3/uL (ref 0.0–0.5)
Eosinophils Relative: 2 %
HCT: 42 % (ref 36.0–46.0)
Hemoglobin: 13.5 g/dL (ref 12.0–15.0)
Immature Granulocytes: 0 %
Lymphocytes Relative: 24 %
Lymphs Abs: 1.5 10*3/uL (ref 0.7–4.0)
MCH: 30.3 pg (ref 26.0–34.0)
MCHC: 32.1 g/dL (ref 30.0–36.0)
MCV: 94.4 fL (ref 80.0–100.0)
Monocytes Absolute: 0.6 10*3/uL (ref 0.1–1.0)
Monocytes Relative: 10 %
Neutro Abs: 4 10*3/uL (ref 1.7–7.7)
Neutrophils Relative %: 63 %
Platelet Count: 325 10*3/uL (ref 150–400)
RBC: 4.45 MIL/uL (ref 3.87–5.11)
RDW: 12.8 % (ref 11.5–15.5)
WBC Count: 6.3 10*3/uL (ref 4.0–10.5)
nRBC: 0 % (ref 0.0–0.2)

## 2020-11-04 LAB — CMP (CANCER CENTER ONLY)
ALT: 15 U/L (ref 0–44)
AST: 21 U/L (ref 15–41)
Albumin: 3.8 g/dL (ref 3.5–5.0)
Alkaline Phosphatase: 90 U/L (ref 38–126)
Anion gap: 12 (ref 5–15)
BUN: 18 mg/dL (ref 6–20)
CO2: 21 mmol/L — ABNORMAL LOW (ref 22–32)
Calcium: 9.7 mg/dL (ref 8.9–10.3)
Chloride: 110 mmol/L (ref 98–111)
Creatinine: 1.12 mg/dL — ABNORMAL HIGH (ref 0.44–1.00)
GFR, Estimated: 57 mL/min — ABNORMAL LOW (ref >60)
Glucose, Bld: 102 mg/dL — ABNORMAL HIGH (ref 70–99)
Potassium: 4.3 mmol/L (ref 3.5–5.1)
Sodium: 143 mmol/L (ref 135–145)
Total Bilirubin: 0.3 mg/dL (ref 0.3–1.2)
Total Protein: 7.4 g/dL (ref 6.5–8.1)

## 2020-11-04 LAB — IRON AND TIBC
Iron: 45 ug/dL (ref 41–142)
Saturation Ratios: 12 % — ABNORMAL LOW (ref 21–57)
TIBC: 381 ug/dL (ref 236–444)
UIBC: 336 ug/dL (ref 120–384)

## 2020-11-04 LAB — FERRITIN: Ferritin: 9 ng/mL — ABNORMAL LOW (ref 11–307)

## 2020-11-04 LAB — CEA (IN HOUSE-CHCC): CEA (CHCC-In House): 2.34 ng/mL (ref 0.00–5.00)

## 2020-11-04 NOTE — Progress Notes (Signed)
HEMATOLOGY-ONCOLOGY MYCHART VIDEO VISIT PROGRESS NOTE  I connected with Rebecca Pollard on 11/05/2020 at 10:45 AM EDT by MyChart video conference and verified that I am speaking with the correct person using two identifiers.  I discussed the limitations, risks, security and privacy concerns of performing an evaluation and management service by MyChart and the availability of in person appointments.  I also discussed with the patient that there may be a patient responsible charge related to this service. The patient expressed understanding and agreed to proceed.  Patient's Location: Home Physician Location: Clinic  CHIEF COMPLIANT: Follow-up of secondary erythrocytosis  INTERVAL HISTORY: Rebecca Pollard is a 60 y.o. female with above-mentioned history of secondary erythrocytosis. Labs on 11/04/20 showed Hg 13.5, HCT 42.0, iron saturation 12%, ferritin 9. She presents over MyChart today to review her labs.  She reports no new problems or concerns.  The lab work that she had suggest that she has mild iron deficiency.  She would like to start oral iron therapy.   Observations/Objective:  There were no vitals filed for this visit. There is no height or weight on file to calculate BMI.  I have reviewed the data as listed CMP Latest Ref Rng & Units 11/04/2020 05/07/2020 02/06/2020  Glucose 70 - 99 mg/dL 102(H) - -  BUN 6 - 20 mg/dL 18 - -  Creatinine 0.44 - 1.00 mg/dL 1.12(H) - -  Sodium 135 - 145 mmol/L 143 - -  Potassium 3.5 - 5.1 mmol/L 4.3 - -  Chloride 98 - 111 mmol/L 110 - -  CO2 22 - 32 mmol/L 21(L) - -  Calcium 8.9 - 10.3 mg/dL 9.7 - -  Total Protein 6.5 - 8.1 g/dL 7.4 7.2 7.4  Total Bilirubin 0.3 - 1.2 mg/dL 0.3 0.5 0.3  Alkaline Phos 38 - 126 U/L 90 94 137(H)  AST 15 - 41 U/L 21 21 18   ALT 0 - 44 U/L 15 16 16     Lab Results  Component Value Date   WBC 6.3 11/04/2020   HGB 13.5 11/04/2020   HCT 42.0 11/04/2020   MCV 94.4 11/04/2020   PLT 325 11/04/2020   NEUTROABS 4.0 11/04/2020       Assessment Plan:  Erythrocytosis Mild erythrocytosis: 02/28/2019: Hemoglobin 15.6, hematocrit 46.8 rest of the CBC normal MPN panel: No JAK2 mutation detected (MPL and CAL R Neg)   Diagnosis: Secondary erythrocytosis.  Possible secondary to obstructive sleep apnea.   Lab review: 08/27/2020 hemoglobin is 12.9 (previously was 15.7) 11/06/2019: Hemoglobin 13.5, ferritin 9   Sleep apnea: CPAP Iron deficiency: She will take oral iron therapy. Hypercholesterolemia: On Crestor   Positive ANA with symptoms of rash and fatigue: Seen by rheumatology  Return to clinic in 1 year for labs and follow-up with a MyChart virtual visit.    I discussed the assessment and treatment plan with the patient. The patient was provided an opportunity to ask questions and all were answered. The patient agreed with the plan and demonstrated an understanding of the instructions. The patient was advised to call back or seek an in-person evaluation if the symptoms worsen or if the condition fails to improve as anticipated.   Total time spent: 20 minutes including face-to-face MyChart video visit time and time spent for planning, charting and coordination of care  Rulon Eisenmenger, MD 11/05/2020   I, Cloyde Reams Dorshimer, am acting as scribe for Nicholas Lose, MD.  I have reviewed the above documentation for accuracy and completeness, and I agree with the above.

## 2020-11-05 ENCOUNTER — Telehealth (HOSPITAL_BASED_OUTPATIENT_CLINIC_OR_DEPARTMENT_OTHER): Payer: 59 | Admitting: Hematology and Oncology

## 2020-11-05 DIAGNOSIS — G4733 Obstructive sleep apnea (adult) (pediatric): Secondary | ICD-10-CM

## 2020-11-05 DIAGNOSIS — D751 Secondary polycythemia: Secondary | ICD-10-CM

## 2020-11-05 NOTE — Assessment & Plan Note (Signed)
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  Lab review: 08/27/2020 hemoglobin is 12.9 (previously was 15.7) 11/06/2019:   Sleep apnea: She is not using any CPAP machine at this time. Hypercholesterolemia: On Crestor  Positive ANA with symptoms of rash and fatigue: I sent a referral to rheumatology for evaluation.  Return to clinic in 1 year for labs and follow-up.

## 2020-11-26 ENCOUNTER — Other Ambulatory Visit: Payer: Self-pay | Admitting: Obstetrics & Gynecology

## 2020-11-27 ENCOUNTER — Telehealth: Payer: Self-pay | Admitting: *Deleted

## 2020-11-27 NOTE — Telephone Encounter (Signed)
Received call from pt with complaint of several large bruises without major injury or trauma.  Pt states hx of polycythemia and in the past bruising increased when her labs were out of normal range. Pt concerned and requesting lab and MD f/u for further evaluation and tx.  Apts scheduled and pt verbalized understanding of date and time.

## 2020-11-27 NOTE — Telephone Encounter (Signed)
Received call from pt stating after some thought and considering, she would like to f/u with PCP regarding extensive bruising.  Pt states she will call our office for a f/u if PCP believes it is r/t  polycythemia.  Apts canceled per pt request.

## 2020-12-02 ENCOUNTER — Other Ambulatory Visit: Payer: 59

## 2020-12-03 ENCOUNTER — Ambulatory Visit: Payer: 59 | Admitting: Hematology and Oncology

## 2020-12-23 ENCOUNTER — Telehealth: Payer: Self-pay | Admitting: Cardiovascular Disease

## 2020-12-23 MED ORDER — PRALUENT 150 MG/ML ~~LOC~~ SOAJ: 150.0000 mg | SUBCUTANEOUS | 3 refills | Status: DC

## 2020-12-23 NOTE — Telephone Encounter (Signed)
Will forward to pharm md to help with refill.

## 2020-12-23 NOTE — Telephone Encounter (Signed)
Pt c/o medication issue:  1. Name of Medication:  praluent '150mg'$   2. How are you currently taking this medication (dosage and times per day)? Once every two weeks   3. Are you having a reaction (difficulty breathing--STAT)? No   4. What is your medication issue? pt is going away on vacation for an extended amount of time, is needing two boxes instead of two in order for it to last while she is away, would like to know if this is something that can be done... please advise.

## 2020-12-24 NOTE — Telephone Encounter (Signed)
3 month supply of Praluent sent to pharmacy

## 2021-02-17 NOTE — Progress Notes (Signed)
Patient Care Team: Percell Belt, DO as PCP - General (Family Medicine) Michel Bickers, MD as Consulting Physician (Infectious Diseases) Nicholas Lose, MD as Consulting Physician (Hematology and Oncology) Mansouraty, Telford Nab., MD as Consulting Physician (Gastroenterology) Chesley Mires, MD as Consulting Physician (Pulmonary Disease)  DIAGNOSIS:    ICD-10-CM   1. Erythrocytosis  D75.1 CBC with Differential ([facility] Only)    Ferritin    Iron and TIBC      CHIEF COMPLIANT: Follow-up of secondary erythrocytosis  INTERVAL HISTORY: Rebecca Pollard is a 60 y.o. with above-mentioned history of secondary erythrocytosis. She presents to the clinic today for follow-up.  She has been doing quite well without any new problems or concerns.  She has not started her CPAP machine   ALLERGIES:  is allergic to albuterol sulfate, losartan, esomeprazole, magnesium-containing compounds, and potassium-containing compounds.  MEDICATIONS:  Current Outpatient Medications  Medication Sig Dispense Refill   Alirocumab (PRALUENT) 150 MG/ML SOAJ Inject 150 mg into the skin every 14 (fourteen) days. 6 mL 3   amLODipine (NORVASC) 5 MG tablet Take 1 tablet (5 mg total) by mouth daily. 90 tablet 1   Ascorbic Acid (VITAMIN C) 500 MG CAPS Take 2 tablets by mouth 2 (two) times daily. Pt takes 1 tablet     aspirin 81 MG chewable tablet Chew 81 mg by mouth daily. Pt takes 1-2 tablets daily     Bempedoic Acid-Ezetimibe (NEXLIZET) 180-10 MG TABS Take by mouth.     co-enzyme Q-10 30 MG capsule Take 30 mg by mouth daily. Take 1 pill daily     COLLAGEN PO Take by mouth.     estradiol (ESTRACE) 0.1 MG/GM vaginal cream Place 1 Applicatorful vaginally 2 (two) times a week. 42.5 g 4   montelukast (SINGULAIR) 10 MG tablet TAKE 1 TABLET BY MOUTH EVERYDAY AT BEDTIME 90 tablet 0   Multiple Vitamins-Minerals (MULTIVITAMIN WITH MINERALS) tablet Take 1 tablet by mouth daily.     polycarbophil (FIBERCON) 625 MG tablet       rosuvastatin (CRESTOR) 10 MG tablet Take 1 tablet (10 mg total) by mouth daily. (Patient not taking: Reported on 10/30/2020) 90 tablet 3   vitamin B-12 (CYANOCOBALAMIN) 100 MCG tablet Take 100 mcg by mouth daily.     VITAMIN D PO Take by mouth.     No current facility-administered medications for this visit.    PHYSICAL EXAMINATION: ECOG PERFORMANCE STATUS: 1 - Symptomatic but completely ambulatory  Vitals:   02/18/21 1501  BP: (!) 132/91  Pulse: 85  Resp: 18  Temp: 97.6 F (36.4 C)  SpO2: 98%   Filed Weights   02/18/21 1501  Weight: 177 lb 1.6 oz (80.3 kg)    LABORATORY DATA:  I have reviewed the data as listed CMP Latest Ref Rng & Units 02/18/2021 11/04/2020 05/07/2020  Glucose 70 - 99 mg/dL 93 102(H) -  BUN 6 - 20 mg/dL 11 18 -  Creatinine 0.44 - 1.00 mg/dL 0.97 1.12(H) -  Sodium 135 - 145 mmol/L 143 143 -  Potassium 3.5 - 5.1 mmol/L 4.5 4.3 -  Chloride 98 - 111 mmol/L 108 110 -  CO2 22 - 32 mmol/L 23 21(L) -  Calcium 8.9 - 10.3 mg/dL 9.9 9.7 -  Total Protein 6.5 - 8.1 g/dL 8.1 7.4 7.2  Total Bilirubin 0.3 - 1.2 mg/dL 0.7 0.3 0.5  Alkaline Phos 38 - 126 U/L 125 90 94  AST 15 - 41 U/L 20 21 21   ALT 0 - 44 U/L  15 15 16     Lab Results  Component Value Date   WBC 7.8 02/18/2021   HGB 15.5 (H) 02/18/2021   HCT 47.2 (H) 02/18/2021   MCV 92.7 02/18/2021   PLT 294 02/18/2021   NEUTROABS 5.0 02/18/2021    ASSESSMENT & PLAN:  Erythrocytosis Mild erythrocytosis: 02/28/2019: Hemoglobin 15.6, hematocrit 46.8 rest of the CBC normal MPN panel: No JAK2 mutation detected (MPL and CAL R Neg)   Diagnosis: Secondary erythrocytosis.  Possible secondary to obstructive sleep apnea.   Lab review: 08/27/2020 hemoglobin is 12.9 (previously was 15.7) 11/06/2019: Hemoglobin 13.5, ferritin 9 12/03/2020: [facility]: Hemoglobin 13.9, hematocrit 42.7, CMP: Normal 02/18/2021: Hemoglobin 15.5, hematocrit 47.2   sleep apnea: CPAP Iron deficiency: She will take oral iron  therapy. Hypercholesterolemia: On Crestor   Positive ANA with symptoms of rash and fatigue: Seen by rheumatology I discussed with her that the current hemoglobin is higher than before and therefore I am concerned that she may have untreated obstructive sleep apnea. I recommended 1 year follow-up to review the labs.    Orders Placed This Encounter  Procedures   CBC with Differential ([place] Only)    Standing Status:   Future    Standing Expiration Date:   02/18/2022   Ferritin    Standing Status:   Future    Standing Expiration Date:   02/18/2022   Iron and TIBC    Standing Status:   Future    Standing Expiration Date:   02/18/2022    The patient has a good understanding of the overall plan. she agrees with it. she will call with any problems that may develop before the next visit here.  Total time spent: 20 mins including face to face time and time spent for planning, charting and coordination of care  Rulon Eisenmenger, MD, MPH 02/18/2021  I, Thana Ates, am acting as scribe for Dr. Nicholas Lose.  I have reviewed the above documentation for accuracy and completeness, and I agree with the above.

## 2021-02-18 ENCOUNTER — Inpatient Hospital Stay: Payer: 59 | Attending: Hematology and Oncology | Admitting: Hematology and Oncology

## 2021-02-18 ENCOUNTER — Inpatient Hospital Stay: Payer: 59

## 2021-02-18 ENCOUNTER — Other Ambulatory Visit: Payer: Self-pay

## 2021-02-18 DIAGNOSIS — D751 Secondary polycythemia: Secondary | ICD-10-CM

## 2021-02-18 DIAGNOSIS — G4730 Sleep apnea, unspecified: Secondary | ICD-10-CM | POA: Insufficient documentation

## 2021-02-18 DIAGNOSIS — E7800 Pure hypercholesterolemia, unspecified: Secondary | ICD-10-CM | POA: Diagnosis not present

## 2021-02-18 LAB — CBC WITH DIFFERENTIAL (CANCER CENTER ONLY)
Abs Immature Granulocytes: 0.02 10*3/uL (ref 0.00–0.07)
Basophils Absolute: 0.1 10*3/uL (ref 0.0–0.1)
Basophils Relative: 1 %
Eosinophils Absolute: 0.1 10*3/uL (ref 0.0–0.5)
Eosinophils Relative: 1 %
HCT: 47.2 % — ABNORMAL HIGH (ref 36.0–46.0)
Hemoglobin: 15.5 g/dL — ABNORMAL HIGH (ref 12.0–15.0)
Immature Granulocytes: 0 %
Lymphocytes Relative: 26 %
Lymphs Abs: 2.1 10*3/uL (ref 0.7–4.0)
MCH: 30.5 pg (ref 26.0–34.0)
MCHC: 32.8 g/dL (ref 30.0–36.0)
MCV: 92.7 fL (ref 80.0–100.0)
Monocytes Absolute: 0.6 10*3/uL (ref 0.1–1.0)
Monocytes Relative: 7 %
Neutro Abs: 5 10*3/uL (ref 1.7–7.7)
Neutrophils Relative %: 65 %
Platelet Count: 294 10*3/uL (ref 150–400)
RBC: 5.09 MIL/uL (ref 3.87–5.11)
RDW: 14.4 % (ref 11.5–15.5)
WBC Count: 7.8 10*3/uL (ref 4.0–10.5)
nRBC: 0 % (ref 0.0–0.2)

## 2021-02-18 LAB — CMP (CANCER CENTER ONLY)
ALT: 15 U/L (ref 0–44)
AST: 20 U/L (ref 15–41)
Albumin: 3.8 g/dL (ref 3.5–5.0)
Alkaline Phosphatase: 125 U/L (ref 38–126)
Anion gap: 12 (ref 5–15)
BUN: 11 mg/dL (ref 6–20)
CO2: 23 mmol/L (ref 22–32)
Calcium: 9.9 mg/dL (ref 8.9–10.3)
Chloride: 108 mmol/L (ref 98–111)
Creatinine: 0.97 mg/dL (ref 0.44–1.00)
GFR, Estimated: >60 mL/min (ref >60)
Glucose, Bld: 93 mg/dL (ref 70–99)
Potassium: 4.5 mmol/L (ref 3.5–5.1)
Sodium: 143 mmol/L (ref 135–145)
Total Bilirubin: 0.7 mg/dL (ref 0.3–1.2)
Total Protein: 8.1 g/dL (ref 6.5–8.1)

## 2021-02-18 LAB — IRON AND TIBC
Iron: 45 ug/dL (ref 41–142)
Saturation Ratios: 12 % — ABNORMAL LOW (ref 21–57)
TIBC: 361 ug/dL (ref 236–444)
UIBC: 317 ug/dL (ref 120–384)

## 2021-02-18 LAB — FERRITIN: Ferritin: 62 ng/mL (ref 11–307)

## 2021-02-18 NOTE — Assessment & Plan Note (Signed)
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  Lab review: 08/27/2020 hemoglobin is 12.9 (previously was 15.7) 11/06/2019: Hemoglobin 13.5, ferritin 9 12/03/2020: [facility]: Hemoglobin 13.9, hematocrit 42.7, CMP: Normal  Sleep apnea:CPAP Iron deficiency: She will take oral iron therapy. Hypercholesterolemia:On Crestor  Positive ANA with symptoms of rash and fatigue: Seen by rheumatology I discussed with her that her erythrocytosis has resolved at this time. She can be watched and monitored by her primary care physician. Return to clinic on an as-needed basis.

## 2021-03-07 ENCOUNTER — Telehealth: Payer: Self-pay | Admitting: Cardiovascular Disease

## 2021-03-07 NOTE — Telephone Encounter (Signed)
Pt c/o medication issue:  1. Name of Medication:  Alirocumab (PRALUENT) 150 MG/ML SOAJ rosuvastatin (CRESTOR) 10 MG tablet   2. How are you currently taking this medication (dosage and times per day)? Patient not taking either   3. Are you having a reaction (difficulty breathing--STAT)?   4. What is your medication issue? Patient wanted to report that the symptoms she was having (arthritis, tingling in scalp, fatigue, general body aches, short term memory loss )  have gone away since she stopped taking the medications.   She just wanted to talk to Dr. Kennon Holter Nurse for more advice

## 2021-03-07 NOTE — Telephone Encounter (Signed)
Left message to call back  

## 2021-03-11 NOTE — Telephone Encounter (Signed)
Spoke with the patient. She stated that she was having a variety of symptoms including muscle pain and memory loss. She attributes this to the Praluent. Since she has stopped taking it, she feels like the symptoms has resolved. She is no longer taking any cholesterol medication.  She has Nexlitol and the rosuvastatin at home. She is wondering if she should take one of those or anything at all.  She had a lipid panel done in August. She was taking the Praluent then.

## 2021-03-11 NOTE — Telephone Encounter (Signed)
Pt returning call to speak with Dr. Rachel Bo nurse... please advise

## 2021-03-12 ENCOUNTER — Telehealth: Payer: Self-pay | Admitting: Cardiovascular Disease

## 2021-03-12 NOTE — Telephone Encounter (Signed)
Left message to call back  

## 2021-03-12 NOTE — Telephone Encounter (Signed)
Rebecca Harp, MD  Ricci Barker, RN 2 hours ago (2:44 PM)   Refer back to Pharm D to address lipid management      Spoke with pt's regarding Dr. Kennon Holter recommendations. Will send message to scheduling to set up office visit with pharmD. Pt verbalizes understanding.

## 2021-03-12 NOTE — Telephone Encounter (Signed)
Patient is returning call.  °

## 2021-03-12 NOTE — Telephone Encounter (Signed)
Patient called and said that it was important to talk with a triage nurse. Please call back

## 2021-03-12 NOTE — Telephone Encounter (Signed)
See additional telephone encounter.

## 2021-03-25 NOTE — Telephone Encounter (Signed)
Pt c/o medication issue:  1. Name of Medication:  amLODipine (NORVASC) 5 MG tablet  2. How are you currently taking this medication (dosage and times per day)?  As prescribed   3. Are you having a reaction (difficulty breathing--STAT)?  See below  4. What is your medication issue?   Patient followed up and scheduled 04/10/21 appointment with pharmD. However, she also wanted to discuss Amlodipine. She states she read about the side effects and realized this medication causes a lot of the symptoms she was experiencing about 2-3 weeks ago such as rash and blistering on body, tingling sensation across scalp. She states she has not taken the medication this morning.

## 2021-03-25 NOTE — Telephone Encounter (Signed)
Forwarded to Pharm D to assist patient.

## 2021-03-26 NOTE — Telephone Encounter (Signed)
Patient has been on amlodipine for almost 2 years.  Unknown if having allergic reaction. Recommend discontinuing at this time to see if symptoms resolve.  Recommend follow up with PCP in case symptoms are unrelated to medication.

## 2021-03-26 NOTE — Telephone Encounter (Signed)
Left message to call back   Patient has visit with [facility] on 11/17 - at which time this could also be discussed

## 2021-03-26 NOTE — Telephone Encounter (Signed)
Patient was returning call 

## 2021-03-27 NOTE — Telephone Encounter (Signed)
Spoke with pt, aware of the recommendations. She reports it was the statin that was causing her problems not the amlodipine. She has stopped the statins and she will follow up with the pharmacist on 04/10/21.

## 2021-04-10 ENCOUNTER — Encounter: Payer: Self-pay | Admitting: Pharmacist

## 2021-04-10 ENCOUNTER — Other Ambulatory Visit: Payer: Self-pay

## 2021-04-10 ENCOUNTER — Ambulatory Visit: Payer: 59 | Admitting: Pharmacist

## 2021-04-10 DIAGNOSIS — T466X5A Adverse effect of antihyperlipidemic and antiarteriosclerotic drugs, initial encounter: Secondary | ICD-10-CM | POA: Diagnosis not present

## 2021-04-10 DIAGNOSIS — E782 Mixed hyperlipidemia: Secondary | ICD-10-CM | POA: Diagnosis not present

## 2021-04-10 DIAGNOSIS — M791 Myalgia, unspecified site: Secondary | ICD-10-CM | POA: Insufficient documentation

## 2021-04-10 NOTE — Patient Instructions (Addendum)
It was nice meeting you today  We would like your LDL (bad cholesterol) to be less than 70  We will attempt a trial of Nexletol for 30 days.  If you are tolerant of it, we can add Zetia or change the prescription to Nexlizet which is a combination of the two  I will complete the prior authorization for you and contact you when it is complete  I also activated a copay card for you  We will recheck your cholesterol in 2-3 months  Please call with any questions or concerns  Karren Cobble, PharmD, BCACP, [place], [place], [place], [place], [address] Phone: [phone], Fax: [phone]

## 2021-04-10 NOTE — Progress Notes (Signed)
Patient ID: Rebecca Pollard                 DOB: 07-Feb-1961                    MRN: [id number]     HPI: Rebecca Pollard is a 60 y.o. female patient referred to lipid clinic by Dr Gwenlyn Found. PMH is significant for HTN and OSA.  Has had multiple adverse reactions to cholesterol lowering medications.  Patient has been trialed on atorvastatin, rosuvastatin, Praluent, and Nexlizet.  Typicall had been taking one or more of these medications at the same time and had adverse reactions such as muscle and joint pain.  Also reports sensitivity to antihypertensive medications.  Has been willing to tolerate muscle and joint pains until recently when she discontinued.  LDL cholesterol then increased from 68 on 05/07/20 to 161 on 03/27/21.  When she saw her new lipid levels she restarted her rosuvastatin and estimates she has been taking it for about 5 days however is already feeling pain in her hands.    Current Medications: n/a  Intolerances: rosuvastatin. Atoravstatin, Nexletol, Praluent  Risk Factors: Family history  LDL goal: <70  Family History: Father: carotid artery blockages  Labs: LDL 161, TC 246, HDL 65, Trigs 180 (03/27/21 not on any medications)  Past Medical History:  Diagnosis Date   Asthma    Chronic bronchitis (HCC)    Chronic fatigue    CKD (chronic kidney disease) stage 3, GFR 30-59 ml/min (HCC) 01/26/2019   Fibromyalgia    GERD (gastroesophageal reflux disease)    Glaucoma    Hypertension 2019   IBS (irritable bowel syndrome)    OSA (obstructive sleep apnea) 05/30/2019   Osteopenia 09/2016   T score -1.3 FRAX 5.3%/0.3%   Palpitations    echo and stress test normal 2015 per pt   Secondary polycythemia 2020   Urinary incontinence     Current Outpatient Medications on File Prior to Visit  Medication Sig Dispense Refill   Alirocumab (PRALUENT) 150 MG/ML SOAJ Inject 150 mg into the skin every 14 (fourteen) days. 6 mL 3   amLODipine (NORVASC) 5 MG tablet Take 1 tablet (5 mg total) by  mouth daily. 90 tablet 1   Ascorbic Acid (VITAMIN C) 500 MG CAPS Take 2 tablets by mouth 2 (two) times daily. Pt takes 1 tablet     aspirin 81 MG chewable tablet Chew 81 mg by mouth daily. Pt takes 1-2 tablets daily     Bempedoic Acid-Ezetimibe (NEXLIZET) 180-10 MG TABS Take by mouth.     co-enzyme Q-10 30 MG capsule Take 30 mg by mouth daily. Take 1 pill daily     COLLAGEN PO Take by mouth.     estradiol (ESTRACE) 0.1 MG/GM vaginal cream Place 1 Applicatorful vaginally 2 (two) times a week. 42.5 g 4   montelukast (SINGULAIR) 10 MG tablet TAKE 1 TABLET BY MOUTH EVERYDAY AT BEDTIME 90 tablet 0   Multiple Vitamins-Minerals (MULTIVITAMIN WITH MINERALS) tablet Take 1 tablet by mouth daily.     polycarbophil (FIBERCON) 625 MG tablet      rosuvastatin (CRESTOR) 10 MG tablet Take 1 tablet (10 mg total) by mouth daily. (Patient not taking: Reported on 10/30/2020) 90 tablet 3   vitamin B-12 (CYANOCOBALAMIN) 100 MCG tablet Take 100 mcg by mouth daily.     VITAMIN D PO Take by mouth.     No current facility-administered medications on file prior to visit.    Allergies  Allergen Reactions   Albuterol Sulfate Other (See Comments)   Losartan     Decreased kidney fx   Esomeprazole Palpitations   Magnesium-Containing Compounds Palpitations   Potassium-Containing Compounds Palpitations    Assessment/Plan:  1. Hyperlipidemia - Patient most recent LDL 168 which is above goal on <70 however patient not on any lipid lowering medications when lab work was drawn.    Patient has multiple adverse effects and intolerances to medications however since she has been on multiple agents at once, difficult to determine which medications she can tolerate and which she can not.  Has not tried Nexletol, Zetia, or Repatha.    Plan: Start Nexletol once daily for 30 days.  If patient able to tolerate, can consider addition of Zetia  or combination Nexlizet.  Instructed patient to d/c rosuvastatin at this time to see if  there are any adverse effects to Nexletol.  Will complete PA for patient.  Printed copay card.  Will contact patient in 1 month.  Start Nexletol 180mg  daily D/C crestor at this time  Karren Cobble, PharmD, [place], [place], [place], [place], [place], [address] Phone: [phone], Fax: [phone]

## 2021-04-11 ENCOUNTER — Telehealth: Payer: Self-pay | Admitting: Pharmacist

## 2021-04-11 DIAGNOSIS — E782 Mixed hyperlipidemia: Secondary | ICD-10-CM

## 2021-04-11 MED ORDER — NEXLETOL 180 MG PO TABS
1.0000 | ORAL_TABLET | Freq: Every day | ORAL | 1 refills | Status: DC
Start: 2021-04-11 — End: 2021-10-15

## 2021-04-11 NOTE — Telephone Encounter (Signed)
[facility] [place]: [id number]  Outcome Additional Information Required Your PA has been resolved, no additional PA is required. For further inquiries please contact the number on the back of the member prescription card. (Message [phone])  Nexletol Rx sent to pharmacy

## 2021-05-02 ENCOUNTER — Telehealth: Payer: Self-pay | Admitting: Cardiovascular Disease

## 2021-05-02 NOTE — Telephone Encounter (Signed)
These do not sound like side effects to Nexletol.  Due to chest pain and palpitations, recommend appt with cardiologist or PCP for assessment

## 2021-05-02 NOTE — Telephone Encounter (Signed)
Pt states the only medication change is the Nexletol, "it does not matter when I take it, I always have a reaction at night. My heart races, it's not immediate it wakes me up at night or when I wake up in the morning. The other day it actually went into the day, I was shocked because it's just at night. The pain only happened when my heart is racing." Last dose was yesterday afternoon about 1pm.  This discomfort/palpations has been going on for about 2 weeks. "This week was sharp chest pains, they just seen to be getting worst."  Will get message over to provider to review.

## 2021-05-02 NOTE — Telephone Encounter (Signed)
Called pt. She states "my symptoms may be related to menopause." PT advised to reach out to her PCP or make an appointment with [place].She states she is not sure what to do because they are leaving out soon and she doesn't know if she needs to be seen sooner. She is going to talk to her husband and PCP and call us back.

## 2021-05-02 NOTE — Telephone Encounter (Signed)
Pt c/o medication issue:  1. Name of Medication:  Bempedoic Acid (NEXLETOL) 180 MG TABS  2. How are you currently taking this medication (dosage and times per day)? As prescribed   3. Are you having a reaction (difficulty breathing--STAT)? Yes  4. What is your medication issue? Heart is pounding at night waking her up. Has also had it in the morning time, seems later in the day she is not getting it as much. This has been going on for the past 3 weeks progressively getting worse. She also reports chest pain on the left side that comes and goes with the palpitations.    Pt c/o of Chest Pain: STAT if CP now or developed within 24 hours  1. Are you having CP right now? No, occurred at 3-4 AM this morning   2. Are you experiencing any other symptoms (ex. SOB, nausea, vomiting, sweating)? Palpitations   3. How long have you been experiencing CP? About 2-3 weeks ago progressively getting worse with palpitations   4. Is your CP continuous or coming and going? Coming and going   5. Have you taken Nitroglycerin? No  ?

## 2021-05-04 ENCOUNTER — Other Ambulatory Visit: Payer: Self-pay | Admitting: Pulmonary Disease

## 2021-05-05 ENCOUNTER — Telehealth: Payer: Self-pay

## 2021-05-05 NOTE — Telephone Encounter (Signed)
Pt has not been seen since 01/2020. ATC patient to schedule an appt for refill. No VM available.

## 2021-05-05 NOTE — Telephone Encounter (Signed)
ATC patient to schedule an appt for refill on singulair. Patient has not been seen since 01/2020. No VM available.

## 2021-06-12 ENCOUNTER — Telehealth: Payer: Self-pay | Admitting: Pulmonary Disease

## 2021-06-12 NOTE — Telephone Encounter (Signed)
Called and spoke with pt letting her know that her upcoming appt is to try to see if she has OSA. Stated to her that PCP sent the referral. Pt wanted to know if this appt was a preventative appt and I again stated to her that this was an appt to rule out OSA. Pt wanted to cancel appt at this time so appt was cancelled for pt. Nothing further needed.

## 2021-06-16 ENCOUNTER — Ambulatory Visit: Payer: 59 | Admitting: Pulmonary Disease

## 2021-07-24 ENCOUNTER — Telehealth: Payer: Self-pay | Admitting: *Deleted

## 2021-07-24 NOTE — Telephone Encounter (Signed)
Received call from pt with complaint of increase in bilateral lower extremity busing x several days.  Pt requesting lab and MD f/u.  RN sent high priority message to scheduling to arrange appts.  ?

## 2021-07-25 ENCOUNTER — Telehealth: Payer: Self-pay | Admitting: Hematology and Oncology

## 2021-07-25 NOTE — Telephone Encounter (Signed)
Sch per 3/2 inbasket, left pt message ?

## 2021-07-29 ENCOUNTER — Other Ambulatory Visit: Payer: Self-pay

## 2021-07-29 ENCOUNTER — Other Ambulatory Visit: Payer: 59

## 2021-07-29 ENCOUNTER — Inpatient Hospital Stay: Payer: 59 | Attending: Hematology and Oncology

## 2021-07-29 DIAGNOSIS — E7800 Pure hypercholesterolemia, unspecified: Secondary | ICD-10-CM | POA: Diagnosis not present

## 2021-07-29 DIAGNOSIS — R21 Rash and other nonspecific skin eruption: Secondary | ICD-10-CM | POA: Insufficient documentation

## 2021-07-29 DIAGNOSIS — G4730 Sleep apnea, unspecified: Secondary | ICD-10-CM | POA: Insufficient documentation

## 2021-07-29 DIAGNOSIS — D751 Secondary polycythemia: Secondary | ICD-10-CM | POA: Diagnosis not present

## 2021-07-29 LAB — CBC WITH DIFFERENTIAL (CANCER CENTER ONLY)
Abs Immature Granulocytes: 0.01 10*3/uL (ref 0.00–0.07)
Basophils Absolute: 0.1 10*3/uL (ref 0.0–0.1)
Basophils Relative: 1 %
Eosinophils Absolute: 0.1 10*3/uL (ref 0.0–0.5)
Eosinophils Relative: 2 %
HCT: 47.9 % — ABNORMAL HIGH (ref 36.0–46.0)
Hemoglobin: 15.7 g/dL — ABNORMAL HIGH (ref 12.0–15.0)
Immature Granulocytes: 0 %
Lymphocytes Relative: 22 %
Lymphs Abs: 1.2 10*3/uL (ref 0.7–4.0)
MCH: 31.7 pg (ref 26.0–34.0)
MCHC: 32.8 g/dL (ref 30.0–36.0)
MCV: 96.6 fL (ref 80.0–100.0)
Monocytes Absolute: 0.5 10*3/uL (ref 0.1–1.0)
Monocytes Relative: 8 %
Neutro Abs: 3.9 10*3/uL (ref 1.7–7.7)
Neutrophils Relative %: 67 %
Platelet Count: 299 10*3/uL (ref 150–400)
RBC: 4.96 MIL/uL (ref 3.87–5.11)
RDW: 12.7 % (ref 11.5–15.5)
WBC Count: 5.7 10*3/uL (ref 4.0–10.5)
nRBC: 0 % (ref 0.0–0.2)

## 2021-07-29 LAB — IRON AND IRON BINDING CAPACITY (CC-WL,HP ONLY)
Iron: 91 ug/dL (ref 28–170)
Saturation Ratios: 22 % (ref 10.4–31.8)
TIBC: 407 ug/dL (ref 250–450)
UIBC: 316 ug/dL (ref 148–442)

## 2021-07-29 LAB — FERRITIN: Ferritin: 23 ng/mL (ref 11–307)

## 2021-07-30 NOTE — Progress Notes (Signed)
? ?Patient Care Team: ?Lazoff, Waldemar Dickens, DO as PCP - General (Family Medicine) ?Michel Bickers, MD as Consulting Physician (Infectious Diseases) ?Nicholas Lose, MD as Consulting Physician (Hematology and Oncology) ?Mansouraty, Telford Nab., MD as Consulting Physician (Gastroenterology) ?Chesley Mires, MD as Consulting Physician (Pulmonary Disease) ? ?DIAGNOSIS:  ?  ICD-10-CM   ?1. Erythrocytosis  D75.1   ?  ? ? ?CHIEF COMPLIANT: Follow-up of secondary erythrocytosis ? ?INTERVAL HISTORY: Rebecca Pollard is a 61 y.o. with above-mentioned history of secondary erythrocytosis. She presents to the clinic today for follow-up.  She came in urgently because she was having episodes of severe sudden onset of pain that starts in 1 temple and then goes to the other side.  Last for a brief moment.  Subsequently she has had some mild discomfort in the head periodically. ? ?ALLERGIES:  is allergic to albuterol sulfate, atorvastatin, losartan, rosuvastatin, esomeprazole, magnesium-containing compounds, and potassium-containing compounds. ? ?MEDICATIONS:  ?Current Outpatient Medications  ?Medication Sig Dispense Refill  ? amLODipine (NORVASC) 5 MG tablet Take 1 tablet (5 mg total) by mouth daily. 90 tablet 1  ? Ascorbic Acid (VITAMIN C) 500 MG CAPS Take 2 tablets by mouth 2 (two) times daily. Pt takes 1 tablet    ? aspirin 81 MG chewable tablet Chew 81 mg by mouth daily. Pt takes 1-2 tablets daily    ? Bempedoic Acid (NEXLETOL) 180 MG TABS Take 1 tablet by mouth daily. 90 tablet 1  ? co-enzyme Q-10 30 MG capsule Take 30 mg by mouth daily. Take 1 pill daily    ? COLLAGEN PO Take by mouth.    ? montelukast (SINGULAIR) 10 MG tablet TAKE 1 TABLET AT BEDTIME 90 tablet 1  ? Multiple Vitamins-Minerals (MULTIVITAMIN WITH MINERALS) tablet Take 1 tablet by mouth daily.    ? vitamin B-12 (CYANOCOBALAMIN) 100 MCG tablet Take 100 mcg by mouth daily.    ? VITAMIN D PO Take by mouth.    ? ?No current facility-administered medications for this visit.   ? ? ?PHYSICAL EXAMINATION: ?ECOG PERFORMANCE STATUS: 1 - Symptomatic but completely ambulatory ? ?Vitals:  ? 07/31/21 1517  ?BP: (!) 150/88  ?Pulse: 78  ?Resp: 18  ?Temp: (!) 97.2 ?F (36.2 ?C)  ?SpO2: 99%  ? ?Filed Weights  ? 07/31/21 1517  ?Weight: 181 lb 6.4 oz (82.3 kg)  ? ? ?LABORATORY DATA:  ?I have reviewed the data as listed ?CMP Latest Ref Rng & Units 02/18/2021 11/04/2020 05/07/2020  ?Glucose 70 - 99 mg/dL 93 102(H) -  ?BUN 6 - 20 mg/dL 11 18 -  ?Creatinine 0.44 - 1.00 mg/dL 0.97 1.12(H) -  ?Sodium 135 - 145 mmol/L 143 143 -  ?Potassium 3.5 - 5.1 mmol/L 4.5 4.3 -  ?Chloride 98 - 111 mmol/L 108 110 -  ?CO2 22 - 32 mmol/L 23 21(L) -  ?Calcium 8.9 - 10.3 mg/dL 9.9 9.7 -  ?Total Protein 6.5 - 8.1 g/dL 8.1 7.4 7.2  ?Total Bilirubin 0.3 - 1.2 mg/dL 0.7 0.3 0.5  ?Alkaline Phos 38 - 126 U/L 125 90 94  ?AST 15 - 41 U/L '20 21 21  '$ ?ALT 0 - 44 U/L '15 15 16  '$ ? ? ?Lab Results  ?Component Value Date  ? WBC 5.7 07/29/2021  ? HGB 15.7 (H) 07/29/2021  ? HCT 47.9 (H) 07/29/2021  ? MCV 96.6 07/29/2021  ? PLT 299 07/29/2021  ? NEUTROABS 3.9 07/29/2021  ? ? ?ASSESSMENT & PLAN:  ?Erythrocytosis ?Mild erythrocytosis: ?02/28/2019: Hemoglobin 15.6, hematocrit 46.8 rest of the  CBC normal ?MPN panel: No JAK2 mutation detected (MPL and CAL R Neg) ?  ?Diagnosis: Secondary erythrocytosis.  Possible secondary to obstructive sleep apnea. ?  ?Lab review: ?08/27/2020 hemoglobin is 12.9 (previously was 15.7) ?11/06/2019: Hemoglobin 13.5, ferritin 9 ?12/03/2020: [facility]: Hemoglobin 13.9, hematocrit 42.7, CMP: Normal ?02/18/2021: Hemoglobin 15.5, hematocrit 47.2  ?07/29/2021: Hemoglobin 15.7, hematocrit 47.9 ?  ?sleep apnea: Patient is still not wearing a CPAP.  She needs to do an in-house sleep study. ? ?Iron deficiency: Iron levels appear to be in good shape.  She is no longer on oral iron therapy ?Hypercholesterolemia: On Crestor ? ?severe pressure in the head: She had 2 or 3 episodes of this.  I suspect it is related to barometric changes.   I instructed her to check her blood pressure and her environment next time this event happens. ?If the symptoms persist we can get an MRI of the brain to look for evidence of stroke. ? ?I also discussed with her about donating a unit of blood at [facility]. ? ?Positive ANA with symptoms of rash and fatigue: Rheumatology felt it was not related to lupus ?  ?I recommended 1 year follow-up to review the labs. ? ? ? ?No orders of the defined types were placed in this encounter. ? ?The patient has a good understanding of the overall plan. she agrees with it. she will call with any problems that may develop before the next visit here. ? ?Total time spent: 20 mins including face to face time and time spent for planning, charting and coordination of care ? ?Rulon Eisenmenger, MD, MPH ?07/31/2021 ? ?I, Thana Ates, am acting as scribe for Dr. Nicholas Lose. ? ?I have reviewed the above documentation for accuracy and completeness, and I agree with the above. ? ? ? ? ? ? ?

## 2021-07-31 ENCOUNTER — Inpatient Hospital Stay (HOSPITAL_BASED_OUTPATIENT_CLINIC_OR_DEPARTMENT_OTHER): Payer: 59 | Admitting: Hematology and Oncology

## 2021-07-31 ENCOUNTER — Other Ambulatory Visit: Payer: Self-pay

## 2021-07-31 DIAGNOSIS — D751 Secondary polycythemia: Secondary | ICD-10-CM | POA: Diagnosis not present

## 2021-07-31 NOTE — Assessment & Plan Note (Signed)
Mild erythrocytosis: ?02/28/2019: Hemoglobin 15.6, hematocrit 46.8 rest of the CBC normal ?MPN panel: No JAK2 mutation detected (MPL and CAL R?Neg) ?? ?Diagnosis: Secondary erythrocytosis. ?Possible secondary to obstructive sleep apnea. ?? ?Lab review: ?08/27/2020 hemoglobin is 12.9 (previously was 15.7) ?11/06/2019:?Hemoglobin 13.5, ferritin 9 ?12/03/2020: [facility]: Hemoglobin 13.9, hematocrit 42.7, CMP: Normal ?02/18/2021: Hemoglobin 15.5, hematocrit 47.2  ?? ?sleep apnea:?CPAP ?Iron deficiency: She will take oral iron therapy. ?Hypercholesterolemia:?On Crestor ?? ?Positive ANA with symptoms of rash and fatigue:?Seen by rheumatology ?I discussed with her that the current hemoglobin is higher than before and therefore I am concerned that she may have untreated obstructive sleep apnea. ?I recommended 1 year follow-up to review the labs. ?

## 2021-08-13 ENCOUNTER — Telehealth: Payer: Self-pay | Admitting: Hematology and Oncology

## 2021-08-13 NOTE — Telephone Encounter (Signed)
Canceled appointment per providers scheduled. Did not rescheduled appointment because patient just seen provider and has an appointment for September. Per that los, it states to schedule a yearly follow up. This April appointment was scheduled before that, So it was no longer needed. Left message with patient to call back if she has any questions.   ?

## 2021-08-28 ENCOUNTER — Other Ambulatory Visit: Payer: 59

## 2021-09-04 ENCOUNTER — Ambulatory Visit: Payer: 59 | Admitting: Hematology and Oncology

## 2021-09-20 ENCOUNTER — Encounter (INDEPENDENT_AMBULATORY_CARE_PROVIDER_SITE_OTHER): Payer: Self-pay

## 2021-10-01 ENCOUNTER — Ambulatory Visit (INDEPENDENT_AMBULATORY_CARE_PROVIDER_SITE_OTHER): Payer: 59 | Admitting: Diagnostic Neuroimaging

## 2021-10-01 ENCOUNTER — Ambulatory Visit: Payer: 59 | Admitting: Cardiovascular Disease

## 2021-10-01 ENCOUNTER — Encounter: Payer: Self-pay | Admitting: Diagnostic Neuroimaging

## 2021-10-01 VITALS — BP 142/94 | HR 62 | Ht 65.0 in | Wt 185.0 lb

## 2021-10-01 DIAGNOSIS — D329 Benign neoplasm of meninges, unspecified: Secondary | ICD-10-CM | POA: Diagnosis not present

## 2021-10-01 DIAGNOSIS — G4719 Other hypersomnia: Secondary | ICD-10-CM

## 2021-10-01 DIAGNOSIS — G43109 Migraine with aura, not intractable, without status migrainosus: Secondary | ICD-10-CM | POA: Diagnosis not present

## 2021-10-01 NOTE — Patient Instructions (Signed)
?  HEADACHE / PAIN / VISION CHANGES ?- suspect migraine phenomenon; over the counter meds for now ? ?MENINGIOMA ?- likely incidental finding; repeat MRI brain w/wo in 6 months ? ?FATIGUE / BRAIN FOG ?- check sleep study ?

## 2021-10-01 NOTE — Progress Notes (Signed)
? ?[facility] ? ?PATIENT: Rebecca Pollard ?DOB: [birth year] ? ?REFERRING CLINICIAN: Lazoff, Shawn P, DO ?HISTORY FROM: patient  ?REASON FOR VISIT: new consult  ? ? ?HISTORICAL ? ?CHIEF COMPLAINT:  ?Chief Complaint  ?Patient presents with  ? New Patient (Initial Visit)  ?  Rm 6 with husband Eddie Dibbles. Reports she is here discuss worsening head pain that fluctuates in severity. MRI completed on 09/13/21 and would like to discuss findings. She also reports some brain fog over the last year.  ? ? ?HISTORY OF PRESENT ILLNESS:  ? ?61 year old female here for evaluation of headaches, brain fog, fatigue, meningioma. ? ?5 weeks ago patient had onset of sharp intense pain on the right side of her head.  This moved to the left side of her head.  Symptoms initially lasted severe intensity for a few minutes.  She has had lingering symptoms lasting hours after that.  Over time symptoms have continued with milder dull aching sensation throughout her head.  Sometimes has nausea.  Had one episode of seeing kaleidoscope visual phenomenon for a few minutes.  Some sensitivity to light.  No sensitivity sound.  No prior history of migraine.  No other recent triggering or aggravating factors. ? ?Also having several years of brain fog, daytime fatigue, interrupted sleep.  She was supposed to have a sleep study a few years ago but did not follow through with this.  No definite snoring or apnea witnessed. ? ? ? ?REVIEW OF SYSTEMS: Full 14 system review of systems performed and negative with exception of: as per HPI. ? ?ALLERGIES: ?Allergies  ?Allergen Reactions  ? Albuterol Sulfate Other (See Comments)  ? Atorvastatin   ?  myalgia  ? Losartan   ?  Decreased kidney fx  ? Rosuvastatin   ?  myalgia  ? Esomeprazole Palpitations  ? Magnesium-Containing Compounds Palpitations  ? Potassium-Containing Compounds Palpitations  ? ? ?HOME MEDICATIONS: ?Outpatient Medications Prior to Visit  ?Medication Sig Dispense Refill  ? albuterol  (VENTOLIN HFA) 108 (90 Base) MCG/ACT inhaler TAKE 2 PUFFS BY MOUTH EVERY 6 HOURS AS NEEDED FOR WHEEZE OR SHORTNESS OF BREATH    ? Ascorbic Acid (VITAMIN C) 500 MG CAPS Take 2 tablets by mouth 2 (two) times daily. Pt takes 1 tablet    ? aspirin 81 MG chewable tablet Chew 81 mg by mouth daily. Pt takes 1-2 tablets daily    ? co-enzyme Q-10 30 MG capsule Take 30 mg by mouth daily. Take 1 pill daily    ? COLLAGEN PO Take by mouth.    ? Multiple Vitamins-Minerals (MULTIVITAMIN WITH MINERALS) tablet Take 1 tablet by mouth daily.    ? vitamin B-12 (CYANOCOBALAMIN) 100 MCG tablet Take 100 mcg by mouth daily.    ? VITAMIN D PO Take by mouth.    ? Bempedoic Acid (NEXLETOL) 180 MG TABS Take 1 tablet by mouth daily. (Patient not taking: Reported on 10/01/2021) 90 tablet 1  ? amLODipine (NORVASC) 5 MG tablet Take 1 tablet (5 mg total) by mouth daily. 90 tablet 1  ? montelukast (SINGULAIR) 10 MG tablet TAKE 1 TABLET AT BEDTIME 90 tablet 1  ? ?No facility-administered medications prior to visit.  ? ? ?PAST MEDICAL HISTORY: ?Past Medical History:  ?Diagnosis Date  ? Asthma   ? Chronic bronchitis ([place])   ? Chronic fatigue   ? CKD (chronic kidney disease) stage 3, GFR 30-59 ml/min ([place]) 01/26/2019  ? Fibromyalgia   ? GERD (gastroesophageal reflux disease)   ? Glaucoma   ?  Hypertension 2019  ? IBS (irritable bowel syndrome)   ? OSA (obstructive sleep apnea) 05/30/2019  ? Osteopenia 09/2016  ? T score -1.3 FRAX 5.3%/0.3%  ? Palpitations   ? echo and stress test normal 2015 per pt  ? Secondary polycythemia 2020  ? Urinary incontinence   ? ? ?PAST SURGICAL HISTORY: ?Past Surgical History:  ?Procedure Laterality Date  ? CHOLECYSTECTOMY  1989  ? ERCP    ? TRANSTHORACIC ECHOCARDIOGRAM  11/2013  ? EF 50-55%, normal LV function, normal wall motion, triavial MR and mild tricuspid regurg.  ? ? ?FAMILY HISTORY: ?Family History  ?Adopted: Yes  ?Problem Relation Age of Onset  ? Hearing loss Mother   ? Memory loss Mother   ? Arthritis Father   ? Hearing  loss Father   ? Stroke Father   ? Leukemia Maternal Grandmother   ? Breast cancer Paternal Grandmother   ? Colon cancer Neg Hx   ? Esophageal cancer Neg Hx   ? Rectal cancer Neg Hx   ? Stomach cancer Neg Hx   ? Colon polyps Neg Hx   ? ? ?SOCIAL HISTORY: ?Social History  ? ?Socioeconomic History  ? Marital status: Married  ?  Spouse name: Eddie Dibbles  ? Number of children: 3  ? Years of education: Not on file  ? Highest education level: Not on file  ?Occupational History  ? Occupation: Water quality scientist  ?Tobacco Use  ? Smoking status: Never  ? Smokeless tobacco: Never  ?Vaping Use  ? Vaping Use: Never used  ?Substance and Sexual Activity  ? Alcohol use: Not Currently  ?  Comment: rare  ? Drug use: No  ? Sexual activity: Yes  ?  Partners: Male  ?  Birth control/protection: Other-see comments  ?  Comment: 1st intercourse- 87, patners- 16, married- 27 yrs   ?Other Topics Concern  ? Not on file  ?Social History Narrative  ? Married to [place]. 2 children, Alice and [place].   ? 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   ? Drinks caffeine( 3 times per week), herbal remedies, daily vitamin use.   ? Wears her seatbelt, smoke detector in the home, no firearms in the home.   ? Feels safe in her relationships.   ? Exercises 2x a week.   ? Right handed   ? ?Social Determinants of Health  ? ?Financial Resource Strain: Not on file  ?Food Insecurity: Not on file  ?Transportation Needs: Not on file  ?Physical Activity: Not on file  ?Stress: Not on file  ?Social Connections: Not on file  ?Intimate Partner Violence: Not on file  ? ? ? ?PHYSICAL EXAM ? ?GENERAL EXAM/CONSTITUTIONAL: ?Vitals:  ?Vitals:  ? 10/01/21 0935  ?BP: (!) 142/94  ?Pulse: 62  ?SpO2: 95%  ?Weight: 185 lb (83.9 kg)  ?Height: '5\' 5"'$  (1.651 m)  ? ?Body mass index is 30.79 kg/m?. ?Wt Readings from Last 3 Encounters:  ?10/01/21 185 lb (83.9 kg)  ?07/31/21 181 lb 6.4 oz (82.3 kg)  ?02/18/21 177 lb 1.6 oz (80.3 kg)  ? ?Patient is in no distress; well developed, nourished and  groomed; neck is supple ? ?CARDIOVASCULAR: ?Examination of carotid arteries is normal; no carotid bruits ?Regular rate and rhythm, no murmurs ?Examination of peripheral vascular system by observation and palpation is normal ? ?EYES: ?Ophthalmoscopic exam of optic discs and posterior segments is normal; no papilledema or hemorrhages ?No results found. ? ?MUSCULOSKELETAL: ?Gait, strength, tone, movements noted in Neurologic exam below ? ?NEUROLOGIC: ?MENTAL STATUS:  ?   ?  View : No data to display.  ?  ?  ?  ? ?awake, alert, oriented to person, place and time ?recent and remote memory intact ?normal attention and concentration ?language fluent, comprehension intact, naming intact ?fund of knowledge appropriate ? ?CRANIAL NERVE:  ?2nd - no papilledema on fundoscopic exam ?2nd, 3rd, 4th, 6th - pupils equal and reactive to light, visual fields full to confrontation, extraocular muscles intact, no nystagmus ?5th - facial sensation symmetric ?7th - facial strength symmetric ?8th - hearing intact ?9th - palate elevates symmetrically, uvula midline ?11th - shoulder shrug symmetric ?12th - tongue protrusion midline ? ?MOTOR:  ?normal bulk and tone, full strength in the BUE, BLE ? ?SENSORY:  ?normal and symmetric to light touch, temperature, vibration ? ?COORDINATION:  ?finger-nose-finger, fine finger movements normal ? ?REFLEXES:  ?deep tendon reflexes present and symmetric ? ?GAIT/STATION:  ?narrow based gait ? ? ? ? ?DIAGNOSTIC DATA (LABS, IMAGING, TESTING) ?- I reviewed patient records, labs, notes, testing and imaging myself where available. ? ?Lab Results  ?Component Value Date  ? WBC 5.7 07/29/2021  ? HGB 15.7 (H) 07/29/2021  ? HCT 47.9 (H) 07/29/2021  ? MCV 96.6 07/29/2021  ? PLT 299 07/29/2021  ? ?   ?Component Value Date/Time  ? NA 143 02/18/2021 1451  ? K 4.5 02/18/2021 1451  ? CL 108 02/18/2021 1451  ? CO2 23 02/18/2021 1451  ? GLUCOSE 93 02/18/2021 1451  ? BUN 11 02/18/2021 1451  ? CREATININE 0.97 02/18/2021 1451   ? CREATININE 0.92 07/27/2016 1556  ? CALCIUM 9.9 02/18/2021 1451  ? PROT 8.1 02/18/2021 1451  ? PROT 7.2 05/07/2020 1014  ? ALBUMIN 3.8 02/18/2021 1451  ? ALBUMIN 4.5 05/07/2020 1014  ? AST 20 02/18/2021 14

## 2021-10-02 ENCOUNTER — Telehealth: Payer: Self-pay | Admitting: Diagnostic Neuroimaging

## 2021-10-02 NOTE — Telephone Encounter (Signed)
I called and schedule MRI with the patient and she said that she had an MRI of her brain done about a week ago and thought that we were able to see it. She thinks it was through [facility] but could not remember for sure. She would like to know if she needs this MRI and if we can see the MRI she just had. She said if this MRI brain w/wo contrast is different she is ok with keeping appointment. Please advise ?

## 2021-10-02 NOTE — Telephone Encounter (Signed)
I called the patient back and left a voice mail that it was my mistake I didn't see this MRI was ordered to be done in November 2023. I let her know and said she can call me back any time before then to get scheduled NPR with her insurance and I cancelled the MRI I had scheduled for next week. ?

## 2021-10-08 ENCOUNTER — Other Ambulatory Visit: Payer: 59

## 2021-10-08 ENCOUNTER — Encounter: Payer: Self-pay | Admitting: Cardiovascular Disease

## 2021-10-15 ENCOUNTER — Ambulatory Visit (INDEPENDENT_AMBULATORY_CARE_PROVIDER_SITE_OTHER): Payer: 59 | Admitting: Cardiovascular Disease

## 2021-10-15 ENCOUNTER — Encounter: Payer: Self-pay | Admitting: Cardiovascular Disease

## 2021-10-15 VITALS — BP 130/82 | HR 77 | Ht 65.0 in | Wt 183.0 lb

## 2021-10-15 DIAGNOSIS — E782 Mixed hyperlipidemia: Secondary | ICD-10-CM | POA: Diagnosis not present

## 2021-10-15 DIAGNOSIS — I2510 Atherosclerotic heart disease of native coronary artery without angina pectoris: Secondary | ICD-10-CM

## 2021-10-15 DIAGNOSIS — I10 Essential (primary) hypertension: Secondary | ICD-10-CM

## 2021-10-15 NOTE — Assessment & Plan Note (Addendum)
History of hyperlipidemia potentially intolerant to statin therapy.  Her most recent lipid profile performed 6 months ago revealed total cholesterol 246, LDL of 161 and HDL of 65.  We will recheck a lipid liver profile

## 2021-10-15 NOTE — Assessment & Plan Note (Signed)
History of essential hypertension blood pressure measured today at 130/82.  She is not on any antihypertensive medications.

## 2021-10-15 NOTE — Assessment & Plan Note (Signed)
History of CAD by coronary CTA performed 08/14/2019 that showed a coronary calcium score 0 with soft plaque in the mid RCA.  The FFR was 0.93 suggesting that this was not physiologically significant.  She denies chest pain.

## 2021-10-15 NOTE — Patient Instructions (Signed)
Medication Instructions:  Your physician recommends that you continue on your current medications as directed. Please refer to the Current Medication list given to you today.  *If you need a refill on your cardiac medications before your next appointment, please call your pharmacy*   Lab Work: Your physician recommends that you return for lab work in: the next week or 2 for FASTING lipid/liver profile  If you have labs (blood work) drawn today and your tests are completely normal, you will receive your results only by: MyChart Message (if you have MyChart) OR A paper copy in the mail If you have any lab test that is abnormal or we need to change your treatment, we will call you to review the results.   Follow-Up: At [facility], you and your health needs are our priority.  As part of our continuing mission to provide you with exceptional heart care, we have created designated Provider Care Teams.  These Care Teams include your primary Cardiologist (physician) and Advanced Practice Providers (APPs -  Physician Assistants and Nurse Practitioners) who all work together to provide you with the care you need, when you need it.  We recommend signing up for the patient portal called "MyChart".  Sign up information is provided on this After Visit Summary.  MyChart is used to connect with patients for Virtual Visits (Telemedicine).  Patients are able to view lab/test results, encounter notes, upcoming appointments, etc.  Non-urgent messages can be sent to your provider as well.   To learn more about what you can do with MyChart, go to [email or web address].    Your next appointment:   12 month(s)  The format for your next appointment:   In Person  Provider:   Jonathan Berry, MD  

## 2021-10-15 NOTE — Progress Notes (Signed)
10/15/2021 Rebecca Pollard   Jan 08, 1961  [id number]  Primary Physician Percell Belt, DO Primary Cardiologist: Lorretta Harp MD Lupe Carney, [place]  HPI:  Rebecca Pollard is a 61 y.o.  mildly overweight married Caucasian female mother of 3 children who does not work and was referred by Dr. Raoul Pitch for cardiovascular evaluation because of chest pain.    I last saw her in the office 10/30/2020.  She does have a history of treated hypertension hyperlipidemia.  There is no family history of heart disease patient never had a heart attack or stroke.  She did have a GXT approxi-10 years ago for evaluation of chest pain which was negative.  She does have polycythemia of unknown type and is currently being evaluated by hematology.  She had new onset chest pain on 06/22/2019 with 2 episodes were fairly brief.  She had recurrent episode the following day that awakened her from sleep rating to her left shoulder and on the 30th as well.  She said no subsequent episodes.   She had a coronary CTA performed 08/14/2019 that showed a coronary calcium score of 0 with soft plaque in the mid RCA and FFR of 0.93 suggesting this was not significant.  In addition, she had a 2-week Zio patch that did not reveal any arrhythmias.  I did begin her on atorvastatin 40 mg a day because of an LDL of 183 seen on lipid profile performed 06/26/2019 with subsequent lipid profile performed 10/25/2019 revealing a decline in her LDL down to 100 although she does complain of symptoms compatible statin intolerance with arthralgias.  I think she would benefit from being on a PCSK9 although we will attempt to start her on rosuvastatin to see if she can tolerate this prior to initiating Repatha.   I referred her to our Pharm.D.'s to begin her on Nexletol.    Her most recent lipid profile performed 05/07/2020 revealed total cholesterol 160, LDL of 68 and HDL 63.  She did stop her medications because of arthritic symptoms which resolved.  She is  active and denies chest pain or shortness of breath.  I think she would be a good candidate to finally initiate Repatha.   Current Meds  Medication Sig   albuterol (VENTOLIN HFA) 108 (90 Base) MCG/ACT inhaler TAKE 2 PUFFS BY MOUTH EVERY 6 HOURS AS NEEDED FOR WHEEZE OR SHORTNESS OF BREATH   Ascorbic Acid (VITAMIN C) 500 MG CAPS Take 2 tablets by mouth 2 (two) times daily. Pt takes 1 tablet   aspirin 81 MG chewable tablet Chew 81 mg by mouth daily. Pt takes 1-2 tablets daily   co-enzyme Q-10 30 MG capsule Take 30 mg by mouth daily. Take 1 pill daily   Multiple Vitamins-Minerals (MULTIVITAMIN WITH MINERALS) tablet Take 1 tablet by mouth daily.   VITAMIN D PO Take by mouth.     Allergies  Allergen Reactions   Albuterol Sulfate Other (See Comments)   Atorvastatin     myalgia   Losartan     Decreased kidney fx   Rosuvastatin     myalgia   Esomeprazole Palpitations   Magnesium-Containing Compounds Palpitations   Potassium-Containing Compounds Palpitations    Social History   Socioeconomic History   Marital status: Married    Spouse name: Eddie Dibbles   Number of children: 3   Years of education: Not on file   Highest education level: Not on file  Occupational History   Occupation: Water quality scientist  Tobacco Use  Smoking status: Never   Smokeless tobacco: Never  Vaping Use   Vaping Use: Never used  Substance and Sexual Activity   Alcohol use: Not Currently    Comment: rare   Drug use: No   Sexual activity: Yes    Partners: Male    Birth control/protection: Other-see comments    Comment: 1st intercourse- 17, patners- 47, married- 31 yrs   Other Topics Concern   Not on file  Social History Narrative   Married to [place]. 2 children, Alice and [place].    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    Drinks caffeine( 3 times per week), herbal remedies, daily vitamin use.    Wears her seatbelt, smoke detector in the home, no firearms in the home.    Feels safe in her  relationships.    Exercises 2x a week.    Right handed    Social Determinants of Health   Financial Resource Strain: Not on file  Food Insecurity: Not on file  Transportation Needs: Not on file  Physical Activity: Not on file  Stress: Not on file  Social Connections: Not on file  Intimate Partner Violence: Not on file     Review of Systems: General: negative for chills, fever, night sweats or weight changes.  Cardiovascular: negative for chest pain, dyspnea on exertion, edema, orthopnea, palpitations, paroxysmal nocturnal dyspnea or shortness of breath Dermatological: negative for rash Respiratory: negative for cough or wheezing Urologic: negative for hematuria Abdominal: negative for nausea, vomiting, diarrhea, bright red blood per rectum, melena, or hematemesis Neurologic: negative for visual changes, syncope, or dizziness All other systems reviewed and are otherwise negative except as noted above.    Blood pressure 130/82, pulse 77, height '5\' 5"'$  (1.651 m), weight 183 lb (83 kg), last menstrual period 04/16/2016, SpO2 97 %.  General appearance: alert and no distress Neck: no adenopathy, no carotid bruit, no JVD, supple, symmetrical, trachea midline, and thyroid not enlarged, symmetric, no tenderness/mass/nodules Lungs: clear to auscultation bilaterally Heart: regular rate and rhythm, S1, S2 normal, no murmur, click, rub or gallop Extremities: extremities normal, atraumatic, no cyanosis or edema Pulses: 2+ and symmetric Skin: Skin color, texture, turgor normal. No rashes or lesions Neurologic: Grossly normal  EKG sinus rhythm at 77 without ST or T wave changes.  Personally reviewed this EKG.  ASSESSMENT AND PLAN:   Essential hypertension History of essential hypertension blood pressure measured today at 130/82.  She is not on any antihypertensive medications.  Hyperlipidemia History of hyperlipidemia potentially intolerant to statin therapy.  Her most recent lipid  profile performed 6 months ago revealed total cholesterol 246, LDL of 161 and HDL of 65.  We will recheck a lipid liver profile  Coronary artery disease History of CAD by coronary CTA performed 08/14/2019 that showed a coronary calcium score 0 with soft plaque in the mid RCA.  The FFR was 0.93 suggesting that this was not physiologically significant.  She denies chest pain.     Lorretta Harp MD FACP,FACC,FAHA, [facility] 10/15/2021 2:53 PM

## 2021-10-17 LAB — LIPID PANEL
Chol/HDL Ratio: 4.7 ratio — ABNORMAL HIGH (ref 0.0–4.4)
Cholesterol, Total: 299 mg/dL — ABNORMAL HIGH (ref 100–199)
HDL: 63 mg/dL (ref >39)
LDL Chol Calc (NIH): 205 mg/dL — ABNORMAL HIGH (ref 0–99)
Triglycerides: 165 mg/dL — ABNORMAL HIGH (ref 0–149)
VLDL Cholesterol Cal: 31 mg/dL (ref 5–40)

## 2021-10-17 LAB — HEPATIC FUNCTION PANEL
ALT: 15 IU/L (ref 0–32)
AST: 19 IU/L (ref 0–40)
Albumin: 4.6 g/dL (ref 3.8–4.9)
Alkaline Phosphatase: 122 IU/L — ABNORMAL HIGH (ref 44–121)
Bilirubin Total: 0.6 mg/dL (ref 0.0–1.2)
Bilirubin, Direct: 0.14 mg/dL (ref 0.00–0.40)
Total Protein: 7.5 g/dL (ref 6.0–8.5)

## 2021-10-18 ENCOUNTER — Other Ambulatory Visit: Payer: Self-pay | Admitting: Cardiovascular Disease

## 2021-10-21 ENCOUNTER — Ambulatory Visit: Payer: 59 | Admitting: Cardiovascular Disease

## 2021-10-21 ENCOUNTER — Encounter: Payer: Self-pay | Admitting: Cardiovascular Disease

## 2021-10-21 ENCOUNTER — Telehealth: Payer: Self-pay | Admitting: Cardiovascular Disease

## 2021-10-21 DIAGNOSIS — I2510 Atherosclerotic heart disease of native coronary artery without angina pectoris: Secondary | ICD-10-CM

## 2021-10-21 DIAGNOSIS — E782 Mixed hyperlipidemia: Secondary | ICD-10-CM

## 2021-10-21 NOTE — Telephone Encounter (Signed)
Pt c/o medication issue:  1. Name of Medication: NEXLIZET 180-10 MG TABS Praluent   2. How are you currently taking this medication (dosage and times per day)? Not taking it  3. Are you having a reaction (difficulty breathing--STAT)? No  4. What is your medication issue?  Pt is requesting call back in regards to medication. Was told she would receive instructions for medications one she received labs. Requesting call back to discuss next steps in medication after final results.

## 2021-10-21 NOTE — Telephone Encounter (Signed)
Pt c/o medication issue:  1. Name of Medication: NEXLIZET 180-10 MG TABS  2. How are you currently taking this medication (dosage and times per day)? TAKE 1 TABELT BY MOUTH ONCE A DAY  3. Are you having a reaction (difficulty breathing--STAT)? No  4. What is your medication issue? Pt is wanting to know if doctor would be willing to switch her from this medication to [facility]. Please advise

## 2021-10-21 NOTE — Telephone Encounter (Signed)
Patient wants to know if she can switch from nexlizet to leqvio based on her familial history of hyperlipidemia. Please advise.

## 2021-10-21 NOTE — Telephone Encounter (Signed)
-  Informed pt that once Dr. Gwenlyn Found review results his nurse will call with recommendations. Pt verbalized understanding

## 2021-10-21 NOTE — Telephone Encounter (Signed)
Mihai - we can run her insurance and see if she qualifies for Leqvio or an antibody PCSK9i.  If you want to switch her and she qualifies, we could do the PA for you and you could follow-up on her lipids if you like - unless you want me to do a formal consult or genetic testing?  -[place]

## 2021-10-21 NOTE — Telephone Encounter (Signed)
I think she qualifies for Leqvio (her preference) or Repatha (Dr. 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 Maybe best is to refer to lipid clinic.

## 2021-10-21 NOTE — Telephone Encounter (Signed)
Thanks, [place]. She is actually a Quay Burow patient (I am covering his in-basket). Not sure his preference. Ask yourself : [id number]? I am sure he would be happy with whatever you would recommend.

## 2021-10-22 NOTE — Telephone Encounter (Signed)
Agree that amlodipine is unlikely to cause palpitations.

## 2021-10-22 NOTE — Telephone Encounter (Signed)
JB wanted Repatha. I am OK with prescribing either Leqvio or Repatha for her. See note from Dr. Debara Pickett re: checking eligibility for Chloride.

## 2021-10-22 NOTE — Telephone Encounter (Signed)
MyChart message sent to patient re: PCSK9i or Rebecca Pollard

## 2021-10-23 NOTE — Telephone Encounter (Signed)
Sent message to  [facility] pharmacist ( lipid Clinic)  who saw patient in 03/2021

## 2021-10-24 MED ORDER — PRALUENT 75 MG/ML ~~LOC~~ SOAJ: 1.0000 mL | SUBCUTANEOUS | 1 refills | Status: DC

## 2021-10-24 NOTE — Addendum Note (Signed)
Addended by: Rollen Sox on: 10/24/2021 05:05 PM   Modules accepted: Orders

## 2021-11-03 ENCOUNTER — Ambulatory Visit: Payer: 59 | Admitting: Diagnostic Neuroimaging

## 2021-11-27 ENCOUNTER — Ambulatory Visit (INDEPENDENT_AMBULATORY_CARE_PROVIDER_SITE_OTHER): Payer: 59 | Admitting: Neurology

## 2021-11-27 ENCOUNTER — Encounter: Payer: Self-pay | Admitting: Neurology

## 2021-11-27 VITALS — BP 132/94 | HR 60 | Ht 65.0 in | Wt 178.2 lb

## 2021-11-27 DIAGNOSIS — E663 Overweight: Secondary | ICD-10-CM

## 2021-11-27 DIAGNOSIS — D751 Secondary polycythemia: Secondary | ICD-10-CM

## 2021-11-27 DIAGNOSIS — G4733 Obstructive sleep apnea (adult) (pediatric): Secondary | ICD-10-CM | POA: Diagnosis not present

## 2021-11-27 NOTE — Progress Notes (Signed)
Subjective:    Patient ID: Rebecca Pollard is a 61 y.o. female.    Star Age, MD, PhD [facility] [address] [place], [place] [address]  Dear Bonnita Levan,   I saw your patient, Rebecca Pollard, upon your kind request in my sleep clinic.  Her initial consultation of her sleep disorder, in particular, concern for underlying obstructive sleep apnea, and re-evaluation of her prior diagnosis of obstructive sleep apnea.  The patient is unaccompanied today.  As you know, Ms. Behan is a 61 year old right-handed woman with an underlying medical history of hypertension, irritable bowel syndrome, osteopenia, meningioma, palpitations, secondary polycythemia, asthma, fibromyalgia, reflux disease, and borderline obesity, who reports excessive daytime somnolence, and a prior diagnosis of chronic fatigue.  She was recently evaluated for concern for migraines.  I reviewed your office note from 10/01/2021.  Her Epworth sleepiness score is 3 out of 24, fatigue severity score is 15 out of 63.   Of note, she had a home sleep test 05/23/2019 which showed a total AHI of 10.5/h, O2 nadir 86%.  Study was interpreted by Dr. Halford Chessman.  Of note, patient has seen pulmonology for sleep apnea in 2020 and 2021 and has had follow-up appointments scheduled with pulmonology.  She may have tried CPAP briefly, she recalls having had a machine at home which she tried for a couple of weeks, not sure if she still has the machine.  She has not pursued any other treatment options for sleep apnea through them.  I saw this information after we had completed our visit since she mentioned ongoing issues with concern for bronchitis and shortness of breath, I did do a little deeper in her chart after she left.  She does report a history of using bronchitis medicine in the past which helped her.  She is not sure if she wants to be treated for her sleep apnea, she understands that she had a home sleep test less than  3 years ago.  She is not sure if she snores, her husband snores and his snoring is sometimes bothersome to her.  She goes to bed around 10 and rise time is between 6 and 8.  She does not have night to night nocturia or recurrent morning or nocturnal headaches.  Weight has been more or less stable, weight documented on her home sleep test was 178.  She does not drink caffeine daily, alcohol rarely, no smoking.  Her Past Medical History Is Significant For: Past Medical History:  Diagnosis Date   Asthma    Chronic bronchitis (HCC)    Chronic fatigue    CKD (chronic kidney disease) stage 3, GFR 30-59 ml/min (HCC) 01/26/2019   Fibromyalgia    GERD (gastroesophageal reflux disease)    Glaucoma    Hypertension 2019   IBS (irritable bowel syndrome)    OSA (obstructive sleep apnea) 05/30/2019   Osteopenia 09/2016   T score -1.3 FRAX 5.3%/0.3%   Palpitations    echo and stress test normal 2015 per pt   Secondary polycythemia 2020   Urinary incontinence     Her Past Surgical History Is Significant For: Past Surgical History:  Procedure Laterality Date   CHOLECYSTECTOMY  1989   ERCP     TRANSTHORACIC ECHOCARDIOGRAM  11/2013   EF 50-55%, normal LV function, normal wall motion, triavial MR and mild tricuspid regurg.    Her Family History Is Significant For: Family History  Adopted: Yes  Problem Relation Age of Onset  Hearing loss Mother    Memory loss Mother    Arthritis Father    Hearing loss Father    Stroke Father    Leukemia Maternal Grandmother    Breast cancer Paternal Grandmother    Colon cancer Neg Hx    Esophageal cancer Neg Hx    Rectal cancer Neg Hx    Stomach cancer Neg Hx    Colon polyps Neg Hx    Sleep apnea Neg Hx     Her Social History Is Significant For: Social History   Socioeconomic History   Marital status: Married    Spouse name: Rebecca Pollard   Number of children: 3   Years of education: Not on file   Highest education level: Not on file  Occupational History    Occupation: Water quality scientist  Tobacco Use   Smoking status: Never   Smokeless tobacco: Never  Vaping Use   Vaping Use: Never used  Substance and Sexual Activity   Alcohol use: Not Currently    Comment: rare   Drug use: No   Sexual activity: Yes    Partners: Male    Birth control/protection: Other-see comments    Comment: 1st intercourse- 31, patners- 6, married- 27 yrs   Other Topics Concern   Not on file  Social History Narrative   Married to [place]. 2 children, Rebecca Pollard and Rebecca Pollard.    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    Drinks caffeine( 3 times per week), herbal remedies, daily vitamin use.    Wears her seatbelt, smoke detector in the home, no firearms in the home.    Feels safe in her relationships.    Exercises 2x a week.    Right handed    Social Determinants of Health   Financial Resource Strain: Not on file  Food Insecurity: Not on file  Transportation Needs: Not on file  Physical Activity: Not on file  Stress: Not on file  Social Connections: Not on file    Her Allergies Are:  Allergies  Allergen Reactions   Albuterol Sulfate Other (See Comments)   Atorvastatin     myalgia   Losartan     Decreased kidney fx   Rosuvastatin     myalgia   Esomeprazole Palpitations   Magnesium-Containing Compounds Palpitations   Potassium-Containing Compounds Palpitations  :   Her Current Medications Are:  Outpatient Encounter Medications as of 11/27/2021  Medication Sig   albuterol (VENTOLIN HFA) 108 (90 Base) MCG/ACT inhaler Inhale into the lungs as needed.   Alirocumab (PRALUENT) 75 MG/ML SOAJ Inject 1 mL into the skin every 14 (fourteen) days.   amLODipine (NORVASC) 2.5 MG tablet Take 1 tablet by mouth daily.   Ascorbic Acid (VITAMIN C) 500 MG CAPS Take 2 tablets by mouth 2 (two) times daily. Pt takes 1 tablet   aspirin 81 MG chewable tablet Chew 81 mg by mouth daily. Pt takes 1-2 tablets daily   CALCIUM PO Take by mouth.   co-enzyme Q-10 30 MG capsule  Take 30 mg by mouth daily. Take 1 pill daily   FOLIC ACID PO Take by mouth.   montelukast (SINGULAIR) 5 MG chewable tablet 2.5 mg See admin instructions.   Multiple Vitamins-Minerals (MULTIVITAMIN WITH MINERALS) tablet Take 1 tablet by mouth daily.   NEXLIZET 180-10 MG TABS TAKE 1 TABELT BY MOUTH ONCE A DAY   VITAMIN D PO Take by mouth.   vitamin E (NATURAL VITAMIN E) 180 MG (400 UNITS) capsule    No facility-administered encounter medications on file  as of 11/27/2021.  :   Review of Systems:  Out of a complete 14 point review of systems, all are reviewed and negative with the exception of these symptoms as listed below:   Review of Systems  Neurological:        Pt here Sleep Consult   Pt states  little headaches ,fatigue,hypertension . Pt denies snoring,sleep study.CPAP     ESS:3 FSS:15    Objective:  Neurological Exam  Physical Exam Physical Examination:   Vitals:   11/27/21 0918  BP: (!) 132/94  Pulse: 60    General Examination: The patient is a very pleasant 61 y.o. female in no acute distress. She appears well-developed and well-nourished and well groomed.   HEENT: Normocephalic, atraumatic, pupils are equal, round and reactive to light, extraocular tracking is good without limitation to gaze excursion or nystagmus noted. Hearing is grossly intact. Face is symmetric with normal facial animation. Speech is clear with no dysarthria noted. There is no hypophonia. There is no lip, neck/head, jaw or voice tremor. Neck is supple with full range of passive and active motion. There are no carotid bruits on auscultation. Oropharynx exam reveals: mild mouth dryness, adequate dental hygiene and mild airway crowding.  Neck circumference of 13-3/8 inches.  Minimal overbite.  Chest: Clear to auscultation without wheezing, rhonchi or crackles noted.  Heart: S1+S2+0, regular and normal without murmurs, rubs or gallops noted.   Abdomen: Soft, non-tender and  non-distended.  Extremities: There is no pitting edema in the distal lower extremities bilaterally.   Skin: Warm and dry without trophic changes noted.   Musculoskeletal: exam reveals no obvious joint deformities.   Neurologically:  Mental status: The patient is awake, alert and oriented in all 4 spheres. Her immediate and remote memory, attention, language skills and fund of knowledge are appropriate. There is no evidence of aphasia, agnosia, apraxia or anomia. Speech is clear with normal prosody and enunciation. Thought process is linear. Mood is normal and affect is normal.  Cranial nerves II - XII are as described above under HEENT exam.  Motor exam: Normal bulk, strength and tone is noted. There is no obvious tremor.  Fine motor skills and coordination: grossly intact.  Cerebellar testing: No dysmetria or intention tremor. There is no truncal or gait ataxia.  Sensory exam: intact to light touch in the upper and lower extremities.  Gait, station and balance: She stands easily. No veering to one side is noted. No leaning to one side is noted. Posture is age-appropriate and stance is narrow based. Gait shows normal stride length and normal pace. No problems turning are noted.   Assessment and Plan:  In summary, Monita Swier is a very pleasant 61 y.o.-year old female  with an underlying medical history of hypertension, irritable bowel syndrome, osteopenia, meningioma, palpitations, secondary polycythemia, asthma, fibromyalgia, reflux disease, and borderline obesity, who was diagnosed with mild obstructive sleep apnea via home sleep test in 2020.  She is not sure if she still has a CPAP machine from before.  She recalls CPAP for a couple of weeks.  She is advised that a new machine will likely not be covered by her insurance within 5 years of issuing the first 1.  She had work-up and treatment recommendations through pulmonology and is advised to follow-up with her pulmonology provider.  I did not  have all the information at the time of her visit but did advise her in her after visit summary to follow-up with her lung specialist.  In particular, since she has had ongoing concerns with symptoms of bronchitis and shortness of breath, she may benefit from following up with them and consider treatment for sleep apnea even though it was mild.  Of note, her weight has been stable.  No indication for a second sleep specialist at this time since she has established with a pulmonologist and sleep specialist through pulmonology.  Thank you very much for allowing me to participate in the care of this nice patient. If I can be of any further assistance to you please do not hesitate to me.    Sincerely,   Star Age, MD, PhD

## 2021-11-27 NOTE — Patient Instructions (Addendum)
It was nice to meet you today.  After we completed our visit, I was able to review your chart in more detail. You have seen pulmonology for sleep apnea and as discussed you had a home sleep test in 2020.  Since you have ongoing concerns about bronchitis and shortness of breath, I recommend follow-up with pulmonology, which is your lung specialist, you had seen Dr. Volanda Napoleon for sleep apnea and also were scheduled to see Dr. Halford Chessman.  Since you have had sleep testing before through pulmonology, I recommend you follow-up with them at this point.

## 2021-12-26 ENCOUNTER — Telehealth: Payer: Self-pay | Admitting: Cardiovascular Disease

## 2021-12-26 ENCOUNTER — Other Ambulatory Visit: Payer: Self-pay | Admitting: Cardiovascular Disease

## 2021-12-26 DIAGNOSIS — E782 Mixed hyperlipidemia: Secondary | ICD-10-CM

## 2021-12-26 DIAGNOSIS — I2510 Atherosclerotic heart disease of native coronary artery without angina pectoris: Secondary | ICD-10-CM

## 2021-12-26 NOTE — Telephone Encounter (Signed)
*  STAT* If patient is at the pharmacy, call can be transferred to refill team.   1. Which medications need to be refilled? (please list name of each medication and dose if known) Alirocumab (PRALUENT) 75 MG/ML SOAJ  2. Which pharmacy/location (including street and city if local pharmacy) is medication to be sent to? [id number] [place], [place] - [address]  3. Do they need a 30 day or 90 day supply? [address]

## 2021-12-26 NOTE — Telephone Encounter (Signed)
Called patient- she states that she is currently taking Praluent, she states the side effects aren't anything she can't handle, however she would like to discuss if there are any other medication option for her. She does have an issue with aches and pains in her hands, but she states she sees medications on TV (she did not advise the names of these medications) but would like to know if anything new has come out for her to try for this. She wanted to discuss with Dr.Berry at her upcoming visit, however he seen her in May 2023- recommended one year follow up. She would like for me to send a message to [facility] as well as the Pharmacy team to advise if there was anything else she could take. Thanks!

## 2021-12-26 NOTE — Telephone Encounter (Signed)
Pt c/o medication issue:  1. Name of Medication:    2. How are you currently taking this medication (dosage and times per day)?    3. Are you having a reaction (difficulty breathing--STAT)?    4. What is your medication issue? Patient would like to discuss new medication that she might can take. Please advise

## 2021-12-29 ENCOUNTER — Other Ambulatory Visit: Payer: Self-pay

## 2021-12-29 DIAGNOSIS — E782 Mixed hyperlipidemia: Secondary | ICD-10-CM

## 2021-12-29 NOTE — Telephone Encounter (Signed)
Referral to [facility] placed.  Will notify scheduling- please schedule with PHARMD.  Thanks!

## 2021-12-31 ENCOUNTER — Telehealth: Payer: Self-pay | Admitting: Cardiovascular Disease

## 2021-12-31 ENCOUNTER — Encounter (INDEPENDENT_AMBULATORY_CARE_PROVIDER_SITE_OTHER): Payer: Self-pay

## 2021-12-31 NOTE — Telephone Encounter (Signed)
Pt called wanting to know the status of her praulent refill.  I informed her that Dr. Gwenlyn Found suggested she discuss this with a pharmacist to see if there was anything else she can take and she made an appt 01/21/22 with pharm D

## 2022-01-01 NOTE — Telephone Encounter (Signed)
*  STAT* If patient is at the pharmacy, call can be transferred to refill team.   1. Which medications need to be refilled? (please list name of each medication and dose if known) Alirocumab (PRALUENT) 75 MG/ML SOAJ  2. Which pharmacy/location (including street and city if local pharmacy) is medication to be sent to? [id number] [place], [place] - [address]  3. Do they need a 30 day or 90 day supply?  30 day supply  Patient following up, again requesting refill for Prauluent. Please assist as able.

## 2022-01-02 NOTE — Telephone Encounter (Signed)
Patient called stating the pharmacy has not received this prescription refill as yet.  Patient stated she has only one shot left and she will be going away for 2 weeks leaving next week.

## 2022-01-21 ENCOUNTER — Ambulatory Visit: Payer: 59

## 2022-01-23 ENCOUNTER — Telehealth: Payer: Self-pay | Admitting: Cardiovascular Disease

## 2022-01-23 DIAGNOSIS — E782 Mixed hyperlipidemia: Secondary | ICD-10-CM

## 2022-01-23 DIAGNOSIS — I2510 Atherosclerotic heart disease of native coronary artery without angina pectoris: Secondary | ICD-10-CM

## 2022-01-23 MED ORDER — REPATHA SURECLICK 140 MG/ML ~~LOC~~ SOAJ: 140.0000 mg | SUBCUTANEOUS | 3 refills | Status: DC

## 2022-01-23 NOTE — Telephone Encounter (Signed)
PA submitted which says PA resolved. Rx sent just a few weeks ago, but will send a new one.

## 2022-01-23 NOTE — Telephone Encounter (Signed)
*  STAT* If patient is at the pharmacy, call can be transferred to refill team.   1. Which medications need to be refilled? (please list name of each medication and dose if known)   Evolocumab (REPATHA SURECLICK) 021 MG/ML SOAJ    2. Which pharmacy/location (including street and city if local pharmacy) is medication to be sent to? [id number] [place], [place] - [address]  3. Do they need a 30 day or 90 day supply?  30 day   Pt is out of medication and has been for a week now. Please advise

## 2022-02-02 ENCOUNTER — Encounter: Payer: Self-pay | Admitting: Pulmonary Disease

## 2022-02-02 ENCOUNTER — Ambulatory Visit (INDEPENDENT_AMBULATORY_CARE_PROVIDER_SITE_OTHER): Payer: 59

## 2022-02-02 ENCOUNTER — Ambulatory Visit (INDEPENDENT_AMBULATORY_CARE_PROVIDER_SITE_OTHER): Payer: 59 | Admitting: Pulmonary Disease

## 2022-02-02 VITALS — BP 110/70 | HR 80 | Ht 65.0 in | Wt 169.8 lb

## 2022-02-02 DIAGNOSIS — J410 Simple chronic bronchitis: Secondary | ICD-10-CM | POA: Diagnosis not present

## 2022-02-02 DIAGNOSIS — G4733 Obstructive sleep apnea (adult) (pediatric): Secondary | ICD-10-CM

## 2022-02-02 LAB — NITRIC OXIDE: FeNO level (ppb): 13

## 2022-02-02 MED ORDER — PULMICORT FLEXHALER 90 MCG/ACT IN AEPB: 2.0000 | INHALATION_SPRAY | Freq: Two times a day (BID) | RESPIRATORY_TRACT | 5 refills | Status: AC

## 2022-02-02 NOTE — Patient Instructions (Signed)
Chest xray today  Pulmicort two puffs in the morning and two puffs in the evening, and rinse your mouth after each use  Will arrange for pulmonary function test with follow up after with Dr. Halford Chessman or a nurse practitioner in 6 weeks

## 2022-02-02 NOTE — Progress Notes (Signed)
[facility]  Chief Complaint  Patient presents with   Follow-up    On going Congestion     Past Surgical History:  She  has a past surgical history that includes transthoracic echocardiogram (11/2013); ERCP; and Cholecystectomy (1989).  Past Medical History:  Renal cyst, Osteopenia, IBS, HTN, Glaucoma, GERD, Fibromyalgia, Chronic fatigue, Meningioma  Constitutional:  BP 110/70 (BP Location: Left Arm, Cuff Size: Normal)   Pulse 80   Ht '5\' 5"'$  (1.651 m)   Wt 169 lb 12.8 oz (77 kg)   LMP 04/16/2016 (Approximate)   SpO2 98%   BMI 28.26 kg/m   Brief Summary:  Rebecca Pollard is a 61 y.o. female with allergic rhinitis and asthma, and obstructive sleep apnea.      Subjective:   She thinks she had COVID in February 2020.  Since then she gets chest congestion.  She will sometimes bring up clear sputum.  She feels tight in her chest.  She has used singulair but this makes her feel to dry.  She uses albuterol intermittently, and this helps.  She doesn't feel like she has trouble breathing while asleep.  She isn't having sinus congestion or post nasal drip.  Denies skin rash.  Physical Exam:   Appearance - well kempt   ENMT - no sinus tenderness, no oral exudate, no LAN, Mallampati 3 airway, no stridor, scalloped tongue  Respiratory - equal breath sounds bilaterally, no wheezing or rales  CV - s1s2 regular rate and rhythm, no murmurs  Ext - no clubbing, no edema  Skin - no rashes  Psych - normal mood and affect   Pulmonary testing:  A1AT 01/31/19 >> 141 01/31/19 >> ANA positive 1:80 nuclear/speckled pattern; ds DNA, SCL 70, SSA/SSB, SM negative; ACE 32 PFT 08/02/19 >> FEV1 2.85 (101%), FEV1% 89, TLC 4.86 (92%), DLCO 109%, no BD FeNO 02/02/22 >> 13  Sleep Tests:  HST 05/23/19 >> AHI 10.5, SpO2 low 86%  Social History:  She  reports that she has never smoked. She has never used smokeless tobacco. She reports that she does not  currently use alcohol. She reports that she does not use drugs.  Family History:  Her family history includes Arthritis in her father; Breast cancer in her paternal grandmother; Hearing loss in her father and mother; Leukemia in her maternal grandmother; Memory loss in her mother; Stroke in her father. She was adopted.     Assessment/Plan:   Chest congestion likely from persistent asthma after having presumed COVID 19 infection in February 2020. - will have her try pulmicort flexhaler - singulair caused dryness - will arrange for chest xray and pulmonary function test - prn albuterol   Obstructive sleep apnea. - she opted against oral appliance previously - she doesn't feel like she is having the same issues with her sleep currently - defer additional assessment for now  Time Spent Involved in Patient Care on Day of Examination:  37 minutes  Follow up:   Patient Instructions  Chest xray today  Pulmicort two puffs in the morning and two puffs in the evening, and rinse your mouth after each use  Will arrange for pulmonary function test with follow up after with Dr. Halford Chessman or a nurse practitioner in 6 weeks  Medication List:   Allergies as of 02/02/2022       Reactions   Albuterol Sulfate Other (See Comments)   Atorvastatin    myalgia   Losartan    Decreased kidney fx  Rosuvastatin    myalgia   Esomeprazole Palpitations   Magnesium-containing Compounds Palpitations   Potassium-containing Compounds Palpitations        Medication List        Accurate as of February 02, 2022  9:53 AM. If you have any questions, ask your nurse or doctor.          albuterol 108 (90 Base) MCG/ACT inhaler Commonly known as: VENTOLIN HFA Inhale into the lungs as needed.   amLODipine 2.5 MG tablet Commonly known as: NORVASC Take 1 tablet by mouth daily.   aspirin 81 MG chewable tablet Chew 81 mg by mouth daily. Pt takes 1-2 tablets daily   CALCIUM PO Take by mouth.    co-enzyme Q-10 30 MG capsule Take 30 mg by mouth daily. Take 1 pill daily   FOLIC ACID PO Take by mouth.   montelukast 5 MG chewable tablet Commonly known as: SINGULAIR 2.5 mg See admin instructions.   multivitamin with minerals tablet Take 1 tablet by mouth daily.   Natural Vitamin E 180 MG (400 UNITS) capsule Generic drug: vitamin E   Nexlizet 180-10 MG Tabs Generic drug: Bempedoic Acid-Ezetimibe TAKE 1 TABELT BY MOUTH ONCE A DAY   Pulmicort Flexhaler 90 MCG/ACT inhaler Generic drug: Budesonide Inhale 2 puffs into the lungs 2 (two) times daily. Started by: Chesley Mires, MD   Repatha SureClick 287 MG/ML Soaj Generic drug: Evolocumab Inject 140 mg into the skin every 14 (fourteen) days.   Vitamin C 500 MG Caps Take 2 tablets by mouth 2 (two) times daily. Pt takes 1 tablet   VITAMIN D PO Take by mouth.        Signature:  Chesley Mires, MD [place] Pager - [phone] 02/02/2022, 9:53 AM

## 2022-02-03 ENCOUNTER — Ambulatory Visit: Payer: 59

## 2022-02-17 ENCOUNTER — Inpatient Hospital Stay: Payer: 59 | Admitting: Hematology and Oncology

## 2022-02-17 ENCOUNTER — Inpatient Hospital Stay: Payer: 59

## 2022-03-18 ENCOUNTER — Ambulatory Visit: Payer: 59 | Admitting: Pulmonary Disease

## 2022-03-31 ENCOUNTER — Telehealth: Payer: Self-pay | Admitting: Cardiovascular Disease

## 2022-03-31 NOTE — Telephone Encounter (Signed)
Patient stated she broke out in itchy hives with first repatha  injection, and the 2 injections afterward produced itchy hives but less each time. She stated this didn't happen while on Praluent. She has not taken antihistamine with repatha. Her next injection is this Saturday. Please advise.

## 2022-03-31 NOTE — Telephone Encounter (Signed)
Pt c/o medication issue:  1. Name of Medication:   Evolocumab (REPATHA SURECLICK) 010 MG/ML SOAJ    2. How are you currently taking this medication (dosage and times per day)? Inject 140 mg into the skin every 14 (fourteen) days. - Subcutaneous   3. Are you having a reaction (difficulty breathing--STAT)? Yes, hives, no difficulty breathing   4. What is your medication issue? Pt states has hives with this medications and wants to know if Dr. Gwenlyn Found wanted her stay on this medication or switch to something else.

## 2022-04-06 NOTE — Telephone Encounter (Signed)
Patient reported 10 HIVES that are uncomfortable. She is okay with trialing an antihistamine to see if it helps to improve HIVES. She will call back if HIVES do not resolve or wrosen and consider transitioning to Praluent at that time.

## 2022-04-07 ENCOUNTER — Other Ambulatory Visit: Payer: 59

## 2022-04-15 ENCOUNTER — Encounter: Payer: Self-pay | Admitting: Primary Care

## 2022-04-15 ENCOUNTER — Ambulatory Visit (INDEPENDENT_AMBULATORY_CARE_PROVIDER_SITE_OTHER): Payer: 59 | Admitting: Pulmonary Disease

## 2022-04-15 ENCOUNTER — Ambulatory Visit (INDEPENDENT_AMBULATORY_CARE_PROVIDER_SITE_OTHER): Payer: 59 | Admitting: Primary Care

## 2022-04-15 VITALS — BP 122/80 | HR 70 | Temp 98.5°F | Ht 65.0 in | Wt 165.0 lb

## 2022-04-15 DIAGNOSIS — G4733 Obstructive sleep apnea (adult) (pediatric): Secondary | ICD-10-CM | POA: Diagnosis not present

## 2022-04-15 DIAGNOSIS — J410 Simple chronic bronchitis: Secondary | ICD-10-CM | POA: Diagnosis not present

## 2022-04-15 DIAGNOSIS — J4489 Other specified chronic obstructive pulmonary disease: Secondary | ICD-10-CM

## 2022-04-15 LAB — PULMONARY FUNCTION TEST
DL/VA % pred: 111 %
DL/VA: 4.67 ml/min/mmHg/L
DLCO cor % pred: 100 %
DLCO cor: 21.59 ml/min/mmHg
DLCO unc % pred: 100 %
DLCO unc: 21.59 ml/min/mmHg
FEF 25-75 Post: 4.23 L/sec
FEF 25-75 Pre: 3.46 L/sec
FEF2575-%Change-Post: 22 %
FEF2575-%Pred-Post: 167 %
FEF2575-%Pred-Pre: 137 %
FEV1-%Change-Post: 2 %
FEV1-%Pred-Post: 101 %
FEV1-%Pred-Pre: 98 %
FEV1-Post: 2.79 L
FEV1-Pre: 2.72 L
FEV1FVC-%Change-Post: 2 %
FEV1FVC-%Pred-Pre: 110 %
FEV6-%Change-Post: 0 %
FEV6-%Pred-Post: 91 %
FEV6-%Pred-Pre: 91 %
FEV6-Post: 3.13 L
FEV6-Pre: 3.13 L
FEV6FVC-%Pred-Post: 103 %
FEV6FVC-%Pred-Pre: 103 %
FVC-%Change-Post: 0 %
FVC-%Pred-Post: 88 %
FVC-%Pred-Pre: 88 %
FVC-Post: 3.13 L
FVC-Pre: 3.13 L
Post FEV1/FVC ratio: 89 %
Post FEV6/FVC ratio: 100 %
Pre FEV1/FVC ratio: 87 %
Pre FEV6/FVC Ratio: 100 %
RV % pred: 83 %
RV: 1.68 L
TLC % pred: 90 %
TLC: 4.76 L

## 2022-04-15 MED ORDER — IPRATROPIUM BROMIDE 0.03 % NA SOLN: 2.0000 | Freq: Two times a day (BID) | NASAL | 3 refills | Status: DC | PRN

## 2022-04-15 NOTE — Patient Instructions (Addendum)
Breathing test today was normal Your symptoms are consistent with asthmatic bronchitis Clinical improvement with steroid inhaler  Recommendations - Continue Pulmicort 2 puffs twice daily (you can TRIAL off inhaler when cough is well controlled, if symptoms flare up resume daily use ) - Start Atrovent nasal spray twice daily as needed for allergies/cough  - Take regular mucinex '600mg'$  twice daily as needed to loosen congestion (start with over the counter, if you want medication sent to pharmacy let us know)  Follow-up: - 6 months with Dr. Halford Chessman (at [place] location)

## 2022-04-15 NOTE — Assessment & Plan Note (Signed)
-   HST in 2020 showed mild OSA, AHI 10.5/hr with SpO2 low 86%  - Opted against oral appliance - She is not symptomatic at this time, deferring further assessment

## 2022-04-15 NOTE — Progress Notes (Signed)
$'@Patient'c$  ID: Rebecca Maduro, female    DOB: 09/10/1960, 61 y.o.   MRN: [id number]  Chief Complaint  Patient presents with   Follow-up    Referring provider: Percell Belt, DO  HPI: 61 year old female, never smoked. PMH significant for HTN, OSA, LPR, chronic bronchitis.   Previous [facility] pulmonary encounter: 02/02/22- Dr. Halford Chessman  She thinks she had COVID in February 2020.  Since then she gets chest congestion.  She will sometimes bring up clear sputum.  She feels tight in her chest.  She has used singulair but this makes her feel to dry.  She uses albuterol intermittently, and this helps.  She doesn't feel like she has trouble breathing while asleep.  She isn't having sinus congestion or post nasal drip.  Denies skin rash.  04/15/2022 -Interim hx  Patient presents today to review pulmonary function testing. She was seen by Dr. Halford Chessman in September for chronic bronchitis. Chest congestion felt to be d/t asthma after having presumed covid 19 infection in February 2020. She was given trial Pulmicort flexhaler during her last OV and reports benefit from use. She has chronic congestion, she has a cough with thick clear mucus. She will experience wheezing when acutely ill.    Pulmonary function testing 08/02/19 PFTs >> FEV1 2.85 (101%), FEV1% 89, TLC 4.86 (92%), DLCO 109%, no BD  02/02/22 FeNO  >> 13  04/15/2022 PFT >> FVC 3.13 (88%), FEV1 2.79 (101%), ratio 89, TLC 90%, DLCO 21.59 (100%)  Imaging: 02/02/22 CXR >> No active cardiopulmonary disease. Coarsened interstitial markings, with no confluent airspace disease.  Sleep testing: HST 05/23/19 >> AHI 10.5, SpO2 low 86%     Allergies  Allergen Reactions   Albuterol Sulfate Other (See Comments)   Atorvastatin     myalgia   Losartan     Decreased kidney fx   Rosuvastatin     myalgia   Esomeprazole Palpitations   Magnesium-Containing Compounds Palpitations   Potassium-Containing Compounds Palpitations    Immunization History   Administered Date(s) Administered   Influenza,inj,Quad PF,6+ Mos 05/31/2015, 06/21/2018, 01/17/2019   Influenza-Unspecified 03/28/2013   Moderna Sars-Covid-2 Vaccination 08/10/2019   Tdap 03/13/2011    Past Medical History:  Diagnosis Date   Asthma    Chronic bronchitis (HCC)    Chronic fatigue    CKD (chronic kidney disease) stage 3, GFR 30-59 ml/min (HCC) 01/26/2019   Fibromyalgia    GERD (gastroesophageal reflux disease)    Glaucoma    Hypertension 2019   IBS (irritable bowel syndrome)    OSA (obstructive sleep apnea) 05/30/2019   Osteopenia 09/2016   T score -1.3 FRAX 5.3%/0.3%   Palpitations    echo and stress test normal 2015 per pt   Secondary polycythemia 2020   Urinary incontinence     Tobacco History: Social History   Tobacco Use  Smoking Status Never  Smokeless Tobacco Never   Counseling given: Not Answered   Outpatient Medications Prior to Visit  Medication Sig Dispense Refill   albuterol (VENTOLIN HFA) 108 (90 Base) MCG/ACT inhaler Inhale into the lungs as needed.     amLODipine (NORVASC) 2.5 MG tablet Take 1 tablet by mouth daily.     Ascorbic Acid (VITAMIN C) 500 MG CAPS Take 2 tablets by mouth 2 (two) times daily. Pt takes 1 tablet     aspirin 81 MG chewable tablet Chew 81 mg by mouth daily. Pt takes 1-2 tablets daily     Budesonide (PULMICORT FLEXHALER) 90 MCG/ACT inhaler Inhale 2 puffs into the  lungs 2 (two) times daily. 1 each 5   CALCIUM PO Take by mouth.     co-enzyme Q-10 30 MG capsule Take 30 mg by mouth daily. Take 1 pill daily     Evolocumab (REPATHA SURECLICK) 962 MG/ML SOAJ Inject 140 mg into the skin every 14 (fourteen) days. 6 mL 3   FOLIC ACID PO Take by mouth.     Multiple Vitamins-Minerals (MULTIVITAMIN WITH MINERALS) tablet Take 1 tablet by mouth daily.     NEXLIZET 180-10 MG TABS TAKE 1 TABELT BY MOUTH ONCE A DAY 90 tablet 3   VITAMIN D PO Take by mouth.     vitamin E (NATURAL VITAMIN E) 180 MG (400 UNITS) capsule      montelukast  (SINGULAIR) 5 MG chewable tablet 2.5 mg See admin instructions.     No facility-administered medications prior to visit.    Review of Systems  Review of Systems  Constitutional: Negative.   HENT:  Positive for congestion and postnasal drip.   Respiratory:  Positive for cough. Negative for chest tightness, shortness of breath and wheezing.     Physical Exam  BP 122/80 (BP Location: Right Arm, Patient Position: Sitting, Cuff Size: Normal)   Pulse 70   Temp 98.5 F (36.9 C) (Oral)   Ht '5\' 5"'$  (1.651 m)   Wt 165 lb (74.8 kg)   LMP 04/16/2016 (Approximate)   SpO2 99%   BMI 27.46 kg/m  Physical Exam Constitutional:      Appearance: Normal appearance.  HENT:     Head: Normocephalic and atraumatic.     Mouth/Throat:     Mouth: Mucous membranes are moist.     Pharynx: Oropharynx is clear.  Cardiovascular:     Rate and Rhythm: Normal rate and regular rhythm.  Pulmonary:     Effort: Pulmonary effort is normal.     Breath sounds: Normal breath sounds. No wheezing, rhonchi or rales.  Musculoskeletal:        General: Normal range of motion.     Cervical back: Normal range of motion and neck supple.  Skin:    General: Skin is warm and dry.  Neurological:     General: No focal deficit present.     Mental Status: She is alert and oriented to person, place, and time. Mental status is at baseline.  Psychiatric:        Mood and Affect: Mood normal.        Behavior: Behavior normal.        Thought Content: Thought content normal.        Judgment: Judgment normal.      Lab Results:  CBC    Component Value Date/Time   WBC 5.7 07/29/2021 0922   WBC 6.3 06/26/2019 1014   RBC 4.96 07/29/2021 0922   HGB 15.7 (H) 07/29/2021 0922   HCT 47.9 (H) 07/29/2021 0922   PLT 299 07/29/2021 0922   MCV 96.6 07/29/2021 0922   MCH 31.7 07/29/2021 0922   MCHC 32.8 07/29/2021 0922   RDW 12.7 07/29/2021 0922   LYMPHSABS 1.2 07/29/2021 0922   MONOABS 0.5 07/29/2021 0922   EOSABS 0.1  07/29/2021 0922   BASOSABS 0.1 07/29/2021 0922    BMET    Component Value Date/Time   NA 143 02/18/2021 1451   K 4.5 02/18/2021 1451   CL 108 02/18/2021 1451   CO2 23 02/18/2021 1451   GLUCOSE 93 02/18/2021 1451   BUN 11 02/18/2021 1451   CREATININE 0.97 02/18/2021 1451  CREATININE 0.92 07/27/2016 1556   CALCIUM 9.9 02/18/2021 1451   GFRNONAA >60 02/18/2021 1451   GFRNONAA 71 07/27/2016 1556   GFRAA >60 02/28/2019 1010   GFRAA 82 07/27/2016 1556    BNP No results found for: "BNP"  ProBNP No results found for: "PROBNP"  Imaging: No results found.   Assessment & Plan:   Asthmatic bronchitis , chronic - Seen in September 2023 for chronic congestion. Symptoms felt to be consistent with asthma after having covid infection in 2020. She reports clinical improvement with additional ICS inhaler.She still has cough with thick clear mucus. PFTs today and FENO at previous visit were normal. CXR without acute cardiopulmonary process. She did have some coarsened interstitial markings without airspace disease. Diffusion capacity on pulmonary function test was normal. At this time would not recommend getting CT chest imaging unless symptoms worsen. Recommend continuing Pulmicort 69mg two puffs twice daily, she can decrease to PRN dose if symptoms are well controlled. Advised she start mucinex '600mg'$  twice daily as needed to loosen congestion and start Atrovent nasal spray twice daily for rhinitis. FU in 6 months or sooner if needed.   OSA (obstructive sleep apnea) - HST in 2020 showed mild OSA, AHI 10.5/hr with SpO2 low 86%  - Opted against oral appliance - She is not symptomatic at this time, deferring further assessment    EMartyn Ehrich NP 04/15/2022

## 2022-04-15 NOTE — Assessment & Plan Note (Addendum)
-   Seen in September 2023 for chronic congestion. Symptoms felt to be consistent with asthma after having covid infection in 2020. She reports clinical improvement with additional ICS inhaler.She still has cough with thick clear mucus. PFTs today and FENO at previous visit were normal. CXR without acute cardiopulmonary process. She did have some coarsened interstitial markings without airspace disease. Diffusion capacity on pulmonary function test was normal. At this time would not recommend getting CT chest imaging unless symptoms worsen. Recommend continuing Pulmicort 79mg two puffs twice daily, she can decrease to PRN dose if symptoms are well controlled. Advised she start mucinex '600mg'$  twice daily as needed to loosen congestion and start Atrovent nasal spray twice daily for rhinitis. FU in 6 months or sooner if needed.

## 2022-04-15 NOTE — Progress Notes (Signed)
PFT done today. 

## 2022-04-16 NOTE — Progress Notes (Signed)
Reviewed and agree with assessment/plan.   Khyren Hing, MD [facility] 04/16/2022, 8:15 PM Pager:  [phone]  

## 2022-04-20 ENCOUNTER — Telehealth: Payer: Self-pay | Admitting: Pharmacist Clinician (PhC)/ Clinical Pharmacy Specialist

## 2022-04-20 DIAGNOSIS — E782 Mixed hyperlipidemia: Secondary | ICD-10-CM

## 2022-04-20 NOTE — Telephone Encounter (Signed)
My Chart message sent

## 2022-04-21 ENCOUNTER — Other Ambulatory Visit: Payer: Self-pay | Admitting: Pharmacist Clinician (PhC)/ Clinical Pharmacy Specialist

## 2022-04-21 DIAGNOSIS — E782 Mixed hyperlipidemia: Secondary | ICD-10-CM

## 2022-04-21 NOTE — Addendum Note (Signed)
Addended by: Rockne Menghini on: 04/21/2022 10:03 AM   Modules accepted: Orders

## 2022-05-09 LAB — LIPID PANEL
Chol/HDL Ratio: 1.6 ratio (ref 0.0–4.4)
Cholesterol, Total: 115 mg/dL (ref 100–199)
HDL: 74 mg/dL
LDL Chol Calc (NIH): 25 mg/dL (ref 0–99)
Triglycerides: 79 mg/dL (ref 0–149)
VLDL Cholesterol Cal: 16 mg/dL (ref 5–40)

## 2022-05-11 MED ORDER — ROSUVASTATIN CALCIUM 10 MG PO TABS: 10.0000 mg | ORAL_TABLET | Freq: Every day | ORAL | 3 refills | Status: DC

## 2022-05-11 NOTE — Addendum Note (Signed)
Addended by: Rockne Menghini on: 05/11/2022 12:27 PM   Modules accepted: Orders

## 2022-06-16 ENCOUNTER — Other Ambulatory Visit (HOSPITAL_COMMUNITY): Payer: Self-pay

## 2022-06-19 ENCOUNTER — Other Ambulatory Visit (HOSPITAL_COMMUNITY): Payer: Self-pay

## 2022-06-22 ENCOUNTER — Telehealth: Payer: Self-pay

## 2022-06-22 NOTE — Telephone Encounter (Signed)
Pharmacy Patient Advocate Encounter  Prior Authorization for Repatha '140mg'$ /ml has been approved/requires no authorization   KEY# [id number] Effective dates: 1.1.24 through 12.31.24

## 2022-07-15 ENCOUNTER — Other Ambulatory Visit: Payer: Self-pay | Admitting: Cardiovascular Disease

## 2022-07-22 ENCOUNTER — Telehealth: Payer: Self-pay | Admitting: Cardiovascular Disease

## 2022-07-22 NOTE — Telephone Encounter (Signed)
Pt called in stating she states she has a few blood clots on her legs and would like Dr. Gwenlyn Found to prescribe her a blood thinner before she goes on a airplane to [place] in a few weeks. Please advise

## 2022-07-22 NOTE — Telephone Encounter (Signed)
Called pt to gather more information. Pt states "when I get bruises I know it's a blood clot. And I have a bruise so I know that is what is going on. I researched and I read with my condition I should be on blood thinners a few weeks before traveling. I just want to know if that is true, and if he will prescribe the blood thinner or if he does not recommend them." "I have one on the left leg and a few on the right leg. I am okay to fly or should I not travel? Should I just take it for the trip there and the trip back? I'm just not sure."

## 2022-07-23 ENCOUNTER — Telehealth: Payer: Self-pay | Admitting: *Deleted

## 2022-07-23 DIAGNOSIS — D751 Secondary polycythemia: Secondary | ICD-10-CM

## 2022-07-23 NOTE — Telephone Encounter (Signed)
Patient is calling to see if Dr. Gwenlyn Found or nurse discussed her ability to fly or not. Also about the blood thinner
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
## 2022-07-23 NOTE — Telephone Encounter (Signed)
Received call from pt stating she would like to travel and fly but is concerned with hx of Erythrocytosis. Per MD pt needing labs and MD f/u to further discuss.  Appts scheduled, pt educated and verbalized understanding.

## 2022-07-23 NOTE — Telephone Encounter (Signed)
Called pt no answer. Left detailed message on machine.

## 2022-07-27 ENCOUNTER — Telehealth: Payer: Self-pay | Admitting: Hematology and Oncology

## 2022-07-27 NOTE — Telephone Encounter (Signed)
Scheduled appointment per provider on call. Left voicemail.

## 2022-07-28 ENCOUNTER — Telehealth: Payer: Self-pay

## 2022-07-28 ENCOUNTER — Other Ambulatory Visit: Payer: Self-pay

## 2022-07-28 DIAGNOSIS — D751 Secondary polycythemia: Secondary | ICD-10-CM

## 2022-07-28 NOTE — Progress Notes (Incomplete)
HEMATOLOGY-ONCOLOGY TELEPHONE VISIT PROGRESS NOTE  I connected with our patient on 07/28/22 at 12:30 PM EST by telephone and verified that I am speaking with the correct person using two identifiers.  I discussed the limitations, risks, security and privacy concerns of performing an evaluation and management service by telephone and the availability of in person appointments.  I also discussed with the patient that there may be a patient responsible charge related to this service. The patient expressed understanding and agreed to proceed.   History of Present Illness:   Oncology History   No history exists.    REVIEW OF SYSTEMS:   Constitutional: Denies fevers, chills or abnormal weight loss All other systems were reviewed with the patient and are negative. Observations/Objective:     Assessment Plan:  No problem-specific Assessment & Plan notes found for this encounter.    I discussed the assessment and treatment plan with the patient. The patient was provided an opportunity to ask questions and all were answered. The patient agreed with the plan and demonstrated an understanding of the instructions. The patient was advised to call back or seek an in-person evaluation if the symptoms worsen or if the condition fails to improve as anticipated.   I provided *** minutes of non-face-to-face time during this encounter.  This includes time for charting and coordination of care   [place], CMA  I Anahis Furgeson, Andra Matsuo am acting as a Education administrator for Dr.Vinay [employer]  ***

## 2022-07-28 NOTE — Telephone Encounter (Signed)
Rebecca Pollard called verbalizing concern for flying and would like clotting labs. Per MD, order placed for aPTT and PT/INR. Pt made aware and she verbalized thanks and understanding.

## 2022-07-29 ENCOUNTER — Inpatient Hospital Stay: Payer: 59 | Attending: Hematology and Oncology

## 2022-07-29 DIAGNOSIS — G4730 Sleep apnea, unspecified: Secondary | ICD-10-CM | POA: Insufficient documentation

## 2022-07-29 DIAGNOSIS — D751 Secondary polycythemia: Secondary | ICD-10-CM | POA: Insufficient documentation

## 2022-07-30 ENCOUNTER — Inpatient Hospital Stay: Payer: 59 | Admitting: Hematology and Oncology

## 2022-07-30 ENCOUNTER — Inpatient Hospital Stay: Payer: 59

## 2022-07-30 DIAGNOSIS — G4730 Sleep apnea, unspecified: Secondary | ICD-10-CM | POA: Diagnosis not present

## 2022-07-30 DIAGNOSIS — D751 Secondary polycythemia: Secondary | ICD-10-CM | POA: Diagnosis present

## 2022-07-30 LAB — CBC WITH DIFFERENTIAL (CANCER CENTER ONLY)
Abs Immature Granulocytes: 0.01 10*3/uL (ref 0.00–0.07)
Basophils Absolute: 0 10*3/uL (ref 0.0–0.1)
Basophils Relative: 1 %
Eosinophils Absolute: 0.1 10*3/uL (ref 0.0–0.5)
Eosinophils Relative: 1 %
HCT: 37.8 % (ref 36.0–46.0)
Hemoglobin: 12.2 g/dL (ref 12.0–15.0)
Immature Granulocytes: 0 %
Lymphocytes Relative: 21 %
Lymphs Abs: 1.3 10*3/uL (ref 0.7–4.0)
MCH: 30.4 pg (ref 26.0–34.0)
MCHC: 32.3 g/dL (ref 30.0–36.0)
MCV: 94.3 fL (ref 80.0–100.0)
Monocytes Absolute: 0.5 10*3/uL (ref 0.1–1.0)
Monocytes Relative: 9 %
Neutro Abs: 4.3 10*3/uL (ref 1.7–7.7)
Neutrophils Relative %: 68 %
Platelet Count: 287 10*3/uL (ref 150–400)
RBC: 4.01 MIL/uL (ref 3.87–5.11)
RDW: 13.9 % (ref 11.5–15.5)
WBC Count: 6.2 10*3/uL (ref 4.0–10.5)
nRBC: 0 % (ref 0.0–0.2)

## 2022-07-30 LAB — FERRITIN: Ferritin: 7 ng/mL — ABNORMAL LOW (ref 11–307)

## 2022-07-30 LAB — IRON AND IRON BINDING CAPACITY (CC-WL,HP ONLY)
Iron: 38 ug/dL (ref 28–170)
Saturation Ratios: 8 % — ABNORMAL LOW (ref 10.4–31.8)
TIBC: 491 ug/dL — ABNORMAL HIGH (ref 250–450)
UIBC: 453 ug/dL — ABNORMAL HIGH (ref 148–442)

## 2022-07-30 LAB — PROTIME-INR
INR: 1 (ref 0.8–1.2)
Prothrombin Time: 12.9 seconds (ref 11.4–15.2)

## 2022-07-30 LAB — APTT: aPTT: 33 seconds (ref 24–36)

## 2022-07-30 NOTE — Assessment & Plan Note (Signed)
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  Possible secondary to obstructive sleep apnea.   Lab review: 08/27/2020 hemoglobin is 12.9 (previously was 15.7) 11/06/2019: Hemoglobin 13.5, ferritin 9 12/03/2020: [facility]: Hemoglobin 13.9, hematocrit 42.7, CMP: Normal 02/18/2021: Hemoglobin 15.5, hematocrit 47.2  07/29/2021: Hemoglobin 15.7, hematocrit 47.9 07/02/2022: Hemoglobin 13.9, hematocrit 41.6  sleep apnea: Patient is still not wearing a CPAP.  She needs to do an in-house sleep study.  Current treatment: Periodic blood donations

## 2022-07-31 ENCOUNTER — Inpatient Hospital Stay (HOSPITAL_BASED_OUTPATIENT_CLINIC_OR_DEPARTMENT_OTHER): Payer: 59 | Admitting: Hematology and Oncology

## 2022-07-31 DIAGNOSIS — D582 Other hemoglobinopathies: Secondary | ICD-10-CM | POA: Diagnosis not present

## 2022-07-31 NOTE — Assessment & Plan Note (Signed)
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  Possible secondary to obstructive sleep apnea.   Lab review: 08/27/2020 hemoglobin is 12.9 (previously was 15.7) 11/06/2019: Hemoglobin 13.5, ferritin 9 12/03/2020: [facility]: Hemoglobin 13.9, hematocrit 42.7, CMP: Normal 02/18/2021: Hemoglobin 15.5, hematocrit 47.2  07/29/2021: Hemoglobin 15.7, hematocrit 47.9 07/30/2022: Hemoglobin 12.2, hematocrit 37.8, ferritin 7, iron saturation 8%, TIBC 491   sleep apnea: Patient is still not wearing a CPAP.  She needs to do an in-house sleep study.   Iron deficiency: Patient has evidence of iron deficiency but she does not need any intravenous iron therapy.  I encouraged her to take oral iron supplement. Hypercholesterolemia: On Crestor

## 2022-07-31 NOTE — Progress Notes (Signed)
This encounter was created in error - please disregard.

## 2022-07-31 NOTE — Progress Notes (Signed)
HEMATOLOGY-ONCOLOGY TELEPHONE VISIT PROGRESS NOTE  I connected with our patient on 07/31/22 at 11:45 AM EST by telephone and verified that I am speaking with the correct person using two identifiers.  I discussed the limitations, risks, security and privacy concerns of performing an evaluation and management service by telephone and the availability of in person appointments.  I also discussed with the patient that there may be a patient responsible charge related to this service. The patient expressed understanding and agreed to proceed.   History of Present Illness: Rebecca Pollard is a 62 y.o. with above-mentioned history of secondary erythrocytosis. She presents to the clinic today for a telephone follow-up to review labs. C?O bruising on legs    REVIEW OF SYSTEMS:   Constitutional: Denies fevers, chills or abnormal weight loss All other systems were reviewed with the patient and are negative. Observations/Objective:     Assessment Plan:  Elevated hemoglobin (HCC) Mild erythrocytosis: 02/28/2019: Hemoglobin 15.6, hematocrit 46.8 rest of the CBC normal MPN panel: No JAK2 mutation detected (MPL and CAL R Neg)   Diagnosis: Secondary erythrocytosis.  Possible secondary to obstructive sleep apnea.   Lab review: 08/27/2020 hemoglobin is 12.9 (previously was 15.7) 11/06/2019: Hemoglobin 13.5, ferritin 9 12/03/2020: [facility]: Hemoglobin 13.9, hematocrit 42.7, CMP: Normal 02/18/2021: Hemoglobin 15.5, hematocrit 47.2  07/29/2021: Hemoglobin 15.7, hematocrit 47.9 07/30/2022: Hemoglobin 12.2, hematocrit 37.8, ferritin 7, iron saturation 8%, TIBC 491   Patient had colonoscopy 2 years ago. Another one is to be scheduled soon.  Sleep apnea: Patient is still not wearing a CPAP.  She needs to do an in-house sleep study. Iron deficiency: Patient has evidence of iron deficiency but she does not need any intravenous iron therapy.  I encouraged her to take oral iron supplement. Hypercholesterolemia: On  Crestor Going to [facility] on airplane and she was worried about risk of blood clots (coags are normal) Bruising: probably from taking aspirin.  Recheck labs in 1 year and follow up.  I discussed the assessment and treatment plan with the patient. The patient was provided an opportunity to ask questions and all were answered. The patient agreed with the plan and demonstrated an understanding of the instructions. The patient was advised to call back or seek an in-person evaluation if the symptoms worsen or if the condition fails to improve as anticipated.   I provided 12 minutes of non-face-to-face time during this encounter.  This includes time for charting and coordination of care   Harriette Ohara, MD  I Gardiner Coins am acting as a scribe for Dr.Adelena Desantiago  I have reviewed the above documentation for accuracy and completeness, and I agree with the above.

## 2022-08-10 ENCOUNTER — Telehealth: Payer: Self-pay | Admitting: Cardiovascular Disease

## 2022-08-10 MED ORDER — NEXLIZET 180-10 MG PO TABS: 1.0000 | ORAL_TABLET | Freq: Every day | ORAL | 0 refills | Status: DC
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
## 2022-08-10 NOTE — Telephone Encounter (Signed)
Returned call to patient-patient reports concerns with cost of Nexlizet.  She states co-pay card is not working at her pharmacy.  She reports this is $270/month.  Advised will send to [facility]D lipid clinic to review.     Also patient reports she is due for follow up with Dr. Gwenlyn Found.  Will have scheduling team call to arrange appt.

## 2022-08-10 NOTE — Telephone Encounter (Signed)
Pt c/o medication issue:  1. Name of Medication: Bempedoic Acid-Ezetimibe (NEXLIZET) 180-10 MG TABS   2. How are you currently taking this medication (dosage and times per day)?   TAKE 1 TABELT BY MOUTH ONCE A DAY    3. Are you having a reaction (difficulty breathing--STAT)? no  4. What is your medication issue? Pt would like a callback regarding above medication because she stated its very expensive and she needs an alternative. She also feels like it may be makking her feel very achy. Please advise.

## 2022-08-13 ENCOUNTER — Telehealth: Payer: Self-pay | Admitting: Gastroenterology

## 2022-08-13 NOTE — Telephone Encounter (Signed)
Called pt and setup yearly f/u with Dr. Gwenlyn Found for 12/25/22 at 9:15 am. Patient was added to the wait list incase of a sooner appt coming available after her due date in May.

## 2022-08-13 NOTE — Telephone Encounter (Signed)
Patient called wanting to schedule her recall which is due in 2027. She stated her PCP and GYN provider are both recommending she have another one done this year. The patient also expressed concerns about having two polyps that were not removed on her last colonoscopy, and she does not know why. Please call the patient to discuss further and\or advise on scheduling.

## 2022-08-13 NOTE — Telephone Encounter (Signed)
Called patient back. Voicemail answered. Left her a message of my call.

## 2022-08-20 NOTE — Telephone Encounter (Signed)
Pt returned call. I reviewed 03/2019 procedure report with patient and informed her that she only had 2 polyps and they were both resected and retrieved. Patient states that her husband "distinctly remembers being told that she still had 2 polyps in her colon that were too small to remove at that time, and insurance wouldn't cover it".  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 Patient wants to be sure that she does not still have polyps in her colon, she is very adamant that she had more than 2 polyps. Patient stated that she wanted the polyps out if they are still in there. I told patient that I would send a message to you to confirm and we will be in touch.

## 2022-08-20 NOTE — Telephone Encounter (Signed)
It has been years since her colonoscopy.  And I have reviewed the colonoscopy report.  I see no where that I would have said she still had 2 polyps that were still present and not removed.  I never leave polyps that are known, behind without a plan of follow-up.  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  If her insurance will allow and she wants to have another colonoscopy for surveillance standpoint, I am okay with that.  I had also recommended at some point undergoing a repeat upper endoscopy for follow-up of ulcer disease too. Thanks. GM

## 2022-08-20 NOTE — Telephone Encounter (Signed)
Lm on vm for patient to return call to further discuss recall colonoscopy.

## 2022-08-20 NOTE — Telephone Encounter (Signed)
Called and informed patient of the information below. Pt verbalized understanding and had no concerns at the end of the call.

## 2022-09-02 ENCOUNTER — Ambulatory Visit: Payer: 59 | Admitting: Obstetrics & Gynecology

## 2022-09-29 ENCOUNTER — Other Ambulatory Visit: Payer: Self-pay | Admitting: Cardiovascular Disease

## 2022-09-29 DIAGNOSIS — I2510 Atherosclerotic heart disease of native coronary artery without angina pectoris: Secondary | ICD-10-CM

## 2022-09-29 DIAGNOSIS — E782 Mixed hyperlipidemia: Secondary | ICD-10-CM

## 2022-10-07 ENCOUNTER — Other Ambulatory Visit (HOSPITAL_COMMUNITY): Payer: Self-pay

## 2022-10-07 ENCOUNTER — Telehealth: Payer: Self-pay

## 2022-10-07 NOTE — Telephone Encounter (Signed)
Pharmacy Patient Advocate Encounter  Prior Authorization for REPATHA has been Approved     PA # [id number] Effective dates: 5.15.24 through 5.15.25

## 2022-10-14 ENCOUNTER — Ambulatory Visit (INDEPENDENT_AMBULATORY_CARE_PROVIDER_SITE_OTHER): Payer: 59 | Admitting: Obstetrics & Gynecology

## 2022-10-14 ENCOUNTER — Other Ambulatory Visit (HOSPITAL_COMMUNITY)
Admission: RE | Admit: 2022-10-14 | Discharge: 2022-10-14 | Disposition: A | Discharge status: Home / Self Care | Payer: 59 | Source: Ambulatory Visit | Attending: Obstetrics & Gynecology | Admitting: Obstetrics & Gynecology

## 2022-10-14 ENCOUNTER — Encounter: Payer: Self-pay | Admitting: Obstetrics & Gynecology

## 2022-10-14 VITALS — BP 110/70 | HR 83 | Ht 64.25 in | Wt 162.0 lb

## 2022-10-14 DIAGNOSIS — Z01419 Encounter for gynecological examination (general) (routine) without abnormal findings: Secondary | ICD-10-CM | POA: Diagnosis present

## 2022-10-14 DIAGNOSIS — M85852 Other specified disorders of bone density and structure, left thigh: Secondary | ICD-10-CM | POA: Diagnosis not present

## 2022-10-14 DIAGNOSIS — Z780 Asymptomatic menopausal state: Secondary | ICD-10-CM

## 2022-10-14 NOTE — Progress Notes (Signed)
Rebecca Pollard [birth year] [id number]   History:    62 y.o.  G3P3L3 Married.   RP:  Established patient presenting for annual gyn exam    HPI: Postmenopause, well on no HRT since stopped her Combipatch in 2020.  No PMB.  No pelvic pain.  Rarely sexually active. Pap Neg 08/2019.  Pap reflex today. Urine/BMs wnl. Breasts wnl.  Screening Mammo 03/2019 Negative, will schedule asap. Colono 03/2019.  BD 09/2019 Osteopenia -1.7, will schedule through her Fam MD.  BMI 27.59.  Health labs with Fam MD.  Treated for hyperlipidemia and cHTN. Polycythemia. Meningioma treated with laser.    Past medical history,surgical history, family history and social history were all reviewed and documented in the EPIC chart.  Gynecologic History Patient's last menstrual period was 04/16/2016 (approximate).  Obstetric History OB History  Gravida Para Term Preterm AB Living  3 3       3   SAB IAB Ectopic Multiple Live Births               # Outcome Date GA Lbr Len/2nd Weight Sex Delivery Anes PTL Lv  3 Para           2 Para           1 Para              ROS: A ROS was performed and pertinent positives and negatives are included in the history. GENERAL: No fevers or chills. HEENT: No change in vision, no earache, sore throat or sinus congestion. NECK: No pain or stiffness. CARDIOVASCULAR: No chest pain or pressure. No palpitations. PULMONARY: No shortness of breath, cough or wheeze. GASTROINTESTINAL: No abdominal pain, nausea, vomiting or diarrhea, melena or bright red blood per rectum. GENITOURINARY: No urinary frequency, urgency, hesitancy or dysuria. MUSCULOSKELETAL: No joint or muscle pain, no back pain, no recent trauma. DERMATOLOGIC: No rash, no itching, no lesions. ENDOCRINE: No polyuria, polydipsia, no heat or cold intolerance. No recent change in weight. HEMATOLOGICAL: No anemia or easy bruising or bleeding. NEUROLOGIC: No headache, seizures, numbness, tingling or weakness. PSYCHIATRIC: No depression, no  loss of interest in normal activity or change in sleep pattern.     Exam:   BP 110/70   Pulse 83   Ht 5' 4.25" (1.632 m)   Wt 162 lb (73.5 kg)   LMP 04/16/2016 (Approximate) Comment: not sexually active  SpO2 98%   BMI 27.59 kg/m   Body mass index is 27.59 kg/m.  General appearance : Well developed well nourished female. No acute distress HEENT: Eyes: no retinal hemorrhage or exudates,  Neck supple, trachea midline, no carotid bruits, no thyroidmegaly Lungs: Clear to auscultation, no rhonchi or wheezes, or rib retractions  Heart: Regular rate and rhythm, no murmurs or gallops Breast:Examined in sitting and supine position were symmetrical in appearance, no palpable masses or tenderness,  no skin retraction, no nipple inversion, no nipple discharge, no skin discoloration, no axillary or supraclavicular lymphadenopathy Abdomen: no palpable masses or tenderness, no rebound or guarding Extremities: no edema or skin discoloration or tenderness  Pelvic: Vulva: Normal             Vagina: No gross lesions or discharge  Cervix: No gross lesions or discharge.  Pap reflex done.  Uterus  AV, normal size, shape and consistency, non-tender and mobile  Adnexa  Without masses or tenderness  Anus: Normal   Assessment/Plan:  62 y.o. female for annual exam   1. Encounter for routine gynecological examination with Papanicolaou  smear of cervix Postmenopause, well on no HRT since stopped her Combipatch in 2020.  No PMB.  No pelvic pain.  Rarely sexually active. Pap Neg 08/2019.  Pap reflex today. Urine/BMs wnl. Breasts wnl.  Screening Mammo 03/2019 Negative, will schedule asap. Colono 03/2019.  BD 09/2019 Osteopenia -1.7, will schedule through her Fam MD.  BMI 27.59.  Health labs with Fam MD.  Treated for hyperlipidemia and cHTN. Polycythemia. Meningioma treated with laser.  - Cytology - PAP( [place])  2. Postmenopausal Postmenopause, well on no HRT since stopped her Combipatch in 2020.  No PMB.   No pelvic pain.  Rarely sexually active.   3. Osteopenia of neck of left femur BD 09/2019 Osteopenia -1.7, will schedule through her Fam MD.   Other orders - aspirin EC 81 MG tablet; Take 81 mg by mouth daily. Swallow whole.   Genia Del MD, 11:22 AM

## 2022-10-16 LAB — CYTOLOGY - PAP: Diagnosis: NEGATIVE

## 2022-11-04 ENCOUNTER — Other Ambulatory Visit: Payer: Self-pay | Admitting: Primary Care

## 2022-12-24 ENCOUNTER — Other Ambulatory Visit: Payer: Self-pay | Admitting: Cardiovascular Disease

## 2022-12-25 ENCOUNTER — Ambulatory Visit (INDEPENDENT_AMBULATORY_CARE_PROVIDER_SITE_OTHER): Payer: 59

## 2022-12-25 ENCOUNTER — Ambulatory Visit: Payer: 59 | Attending: Cardiovascular Disease | Admitting: Cardiovascular Disease

## 2022-12-25 ENCOUNTER — Encounter: Payer: Self-pay | Admitting: Cardiovascular Disease

## 2022-12-25 VITALS — BP 128/82 | HR 75 | Ht 64.4 in | Wt 167.0 lb

## 2022-12-25 DIAGNOSIS — I10 Essential (primary) hypertension: Secondary | ICD-10-CM | POA: Diagnosis not present

## 2022-12-25 DIAGNOSIS — R002 Palpitations: Secondary | ICD-10-CM

## 2022-12-25 DIAGNOSIS — I2510 Atherosclerotic heart disease of native coronary artery without angina pectoris: Secondary | ICD-10-CM

## 2022-12-25 DIAGNOSIS — E782 Mixed hyperlipidemia: Secondary | ICD-10-CM

## 2022-12-25 DIAGNOSIS — R0789 Other chest pain: Secondary | ICD-10-CM

## 2022-12-25 NOTE — Assessment & Plan Note (Signed)
History of hyperlipidemia on Repatha and neck close at.  She was taking rosuvastatin in the past but has stopped.  Her most recent lipid profile performed 05/08/2022 revealed total cholesterol 115, LDL of 25 and HDL 74.  She said that she was having some myalgias with her statin drug.  We will recheck a lipid liver profile today.

## 2022-12-25 NOTE — Assessment & Plan Note (Signed)
History of essential hypertension blood pressure measured at 128/82.  She is on amlodipine.

## 2022-12-25 NOTE — Progress Notes (Signed)
12/25/2022 Sinclair Ship   [birth year]  [id number]  Primary Physician Mattie Marlin, DO Primary Cardiologist: Runell Gess MD Nicholes Calamity, [place]  HPI:  Rebecca Pollard is a 62 y.o.   mildly overweight married Caucasian female mother of 3 children who does not work and was referred by Dr. Claiborne Billings for cardiovascular evaluation because of chest pain.    I last saw her in the office 10/16/2018.Marland Kitchen  She does have a history of treated hypertension hyperlipidemia.  There is no family history of heart disease patient never had a heart attack or stroke.  She did have a GXT approxi-10 years ago for evaluation of chest pain which was negative.  She does have polycythemia of unknown type and is currently being evaluated by hematology.  She had new onset chest pain on 06/22/2019 with 2 episodes were fairly brief.  She had recurrent episode the following day that awakened her from sleep rating to her left shoulder and on the 30th as well.  She said no subsequent episodes.   She had a coronary CTA performed 08/14/2019 that showed a coronary calcium score of 0 with soft plaque in the mid RCA and FFR of 0.93 suggesting this was not significant.  In addition, she had a 2-week Zio patch that did not reveal any arrhythmias.  I did begin her on atorvastatin 40 mg a day because of an LDL of 183 seen on lipid profile performed 06/26/2019 with subsequent lipid profile performed 10/25/2019 revealing a decline in her LDL down to 100 although she does complain of symptoms compatible statin intolerance with arthralgias.  I think she would benefit from being on a PCSK9 although we will attempt to start her on rosuvastatin to see if she can tolerate this prior to initiating Repatha.   I referred her to our Pharm.D.'s to begin her on Nexletol.    She also is on Repatha.  She has stopped her statin therapy.  Her most recent lipid profile performed 05/08/2022 revealed total cholesterol 115, LDL 25 and HDL 74.  She does get  occasional atypical/noncardiac chest pain.  She was seen in the ER at [facility] 12/10/2022 with tachypalpitations and chest pain.  Her workup was unrevealing.  Current Meds  Medication Sig   albuterol (VENTOLIN HFA) 108 (90 Base) MCG/ACT inhaler Inhale into the lungs as needed.   amLODipine (NORVASC) 2.5 MG tablet Take 1 tablet by mouth daily.   Ascorbic Acid (VITAMIN C) 500 MG CAPS Take 2 tablets by mouth 2 (two) times daily. Pt takes 1 tablet   aspirin EC 81 MG tablet Take 81 mg by mouth daily. Swallow whole.   Bempedoic Acid-Ezetimibe (NEXLIZET) 180-10 MG TABS TAKE 1 TABELT BY MOUTH ONCE A DAY   Budesonide (PULMICORT FLEXHALER) 90 MCG/ACT inhaler Inhale 2 puffs into the lungs 2 (two) times daily.   CALCIUM PO Take by mouth.   co-enzyme Q-10 30 MG capsule Take 30 mg by mouth daily. Take 1 pill daily   Evolocumab (REPATHA SURECLICK) 140 MG/ML SOAJ INJECT 140 MG INTO THE SKIN EVERY 14 (FOURTEEN) DAYS.   ferrous sulfate 324 MG TBEC Take 324 mg by mouth.   ipratropium (ATROVENT) 0.03 % nasal spray PLACE 2 SPRAYS INTO BOTH NOSTRILS 2 (TWO) TIMES DAILY AS NEEDED FOR RHINITIS (OR COUGH).   Multiple Vitamins-Minerals (MULTIVITAMIN WITH MINERALS) tablet Take 1 tablet by mouth daily.   rosuvastatin (CRESTOR) 10 MG tablet Take 1 tablet (10 mg total) by mouth daily.   VITAMIN  D PO Take by mouth.   vitamin E (NATURAL VITAMIN E) 180 MG (400 UNITS) capsule      Allergies  Allergen Reactions   Albuterol Sulfate Other (See Comments)   Losartan     Decreased kidney fx   Rosuvastatin     myalgia   Esomeprazole Palpitations   Magnesium-Containing Compounds Palpitations   Potassium-Containing Compounds Palpitations    Social History   Socioeconomic History   Marital status: Married    Spouse name: Renae Fickle   Number of children: 3   Years of education: Not on file   Highest education level: Not on file  Occupational History   Occupation: Dance movement psychotherapist  Tobacco Use   Smoking status:  Never   Smokeless tobacco: Never  Vaping Use   Vaping status: Never Used  Substance and Sexual Activity   Alcohol use: Not Currently   Drug use: No   Sexual activity: Not Currently    Partners: Male    Birth control/protection: Post-menopausal    Comment: 1st intercourse- 16, patners- 10  Other Topics Concern   Not on file  Social History Narrative   Married to [employer]. 2 children, Alice and Ophir.    Attended University. Event organiser.    Drinks caffeine( 3 times per week), herbal remedies, daily vitamin use.    Wears her seatbelt, smoke detector in the home, no firearms in the home.    Feels safe in her relationships.    Exercises 2x a week.    Right handed    Social Determinants of Health   Financial Resource Strain: Not on file  Food Insecurity: Low Risk  (09/11/2022)   [facility]   Food vital sign    Within the past 12 months, you worried that your food would run out before you got money to buy more: Never true    Within the past 12 months, the food you bought just didn't last and you didn't have money to get more. : Never true  Transportation Needs: No Transportation Needs (09/11/2022)   [facility]   Transportation    In the past 12 months, has lack of reliable transportation kept you from medical appointments, meetings, work or from getting things needed for daily living? : No  Physical Activity: Not on file  Stress: Not on file  Social Connections: Not on file  Intimate Partner Violence: Not on file     Review of Systems: General: negative for chills, fever, night sweats or weight changes.  Cardiovascular: negative for chest pain, dyspnea on exertion, edema, orthopnea, palpitations, paroxysmal nocturnal dyspnea or shortness of breath Dermatological: negative for rash Respiratory: negative for cough or wheezing Urologic: negative for hematuria Abdominal: negative for nausea, vomiting, diarrhea,  bright red blood per rectum, melena, or hematemesis Neurologic: negative for visual changes, syncope, or dizziness All other systems reviewed and are otherwise negative except as noted above.    Blood pressure 128/82, pulse 75, height 5' 4.4" (1.636 m), weight 167 lb (75.8 kg), last menstrual period 04/16/2016, SpO2 99%.  General appearance: alert and no distress Neck: no adenopathy, no carotid bruit, no JVD, supple, symmetrical, trachea midline, and thyroid not enlarged, symmetric, no tenderness/mass/nodules Lungs: clear to auscultation bilaterally Heart: Regular rate and rhythm without murmurs gallops rubs or clicks Extremities: extremities normal, atraumatic, no cyanosis or edema Pulses: 2+ and symmetric Skin: Skin color, texture, turgor normal. No rashes or lesions Neurologic: Grossly normal  EKG EKG Interpretation Date/Time:  Friday December 25 2022 08:56:05 EDT Ventricular Rate:  75 PR Interval:  140 QRS Duration:  90 QT Interval:  394 QTC Calculation: 439 R Axis:   -4  Text Interpretation: Normal sinus rhythm Normal ECG When compared with ECG of 20-Oct-2013 10:20, PREVIOUS ECG IS PRESENT Confirmed by Nanetta Batty [phone]) on 12/25/2022 9:24:00 AM    ASSESSMENT AND PLAN:   Essential hypertension History of essential hypertension blood pressure measured at 128/82.  She is on amlodipine.  Hyperlipidemia History of hyperlipidemia on Repatha and neck close at.  She was taking rosuvastatin in the past but has stopped.  Her most recent lipid profile performed 05/08/2022 revealed total cholesterol 115, LDL of 25 and HDL 74.  She said that she was having some myalgias with her statin drug.  We will recheck a lipid liver profile today.  Atypical chest pain History of atypical chest pain with a coronary calcium score of 0 performed 08/14/2019 with soft plaque in the mid RCA.  FFR suggested that this was not physiologically significant and 0.93.  She gets occasional atypical chest  pain.  Palpitations Patient gets daily palpitations usually in the morning.  She does not drink caffeine.  She recently was seen in the ER [facility] 12/10/2022 because of tachypalpitations in the middle of the day associate with chest pain.  Her enzymes were negative her EKG showed no acute changes and sinus rhythm.  I am going to get a 2-week Zio patch to further evaluate.     Runell Gess MD FACP,FACC,FAHA, [facility] 12/25/2022 9:37 AM

## 2022-12-25 NOTE — Assessment & Plan Note (Signed)
History of atypical chest pain with a coronary calcium score of 0 performed 08/14/2019 with soft plaque in the mid RCA.  FFR suggested that this was not physiologically significant and 0.93.  She gets occasional atypical chest pain.

## 2022-12-25 NOTE — Patient Instructions (Signed)
Medication Instructions:  Your physician recommends that you continue on your current medications as directed. Please refer to the Current Medication list given to you today.  *If you need a refill on your cardiac medications before your next appointment, please call your pharmacy*   Lab Work: Your physician recommends that you have labs drawn today: Lipid/liver panel  If you have labs (blood work) drawn today and your tests are completely normal, you will receive your results only by: MyChart Message (if you have MyChart) OR A paper copy in the mail If you have any lab test that is abnormal or we need to change your treatment, we will call you to review the results.   Testing/Procedures:  Christena Deem- Long Term Monitor Instructions  Your physician has requested you wear a ZIO patch monitor for 14 days.  This is a single patch monitor. [employer] supplies one patch monitor per enrollment. Additional stickers are not available. Please do not apply patch if you will be having a Nuclear Stress Test,  Echocardiogram, Cardiac CT, MRI, or Chest Xray during the period you would be wearing the  monitor. The patch cannot be worn during these tests. You cannot remove and re-apply the  ZIO XT patch monitor.  Your ZIO patch monitor will be mailed 3 day USPS to your address on file. It may take 3-5 days  to receive your monitor after you have been enrolled.  Once you have received your monitor, please review the enclosed instructions. Your monitor  has already been registered assigning a specific monitor serial # to you.  Billing and Patient Assistance Program Information  We have supplied [employer] with any of your insurance information on file for billing purposes. [employer] offers a sliding scale Patient Assistance Program for patients that do not have  insurance, or whose insurance does not completely cover the cost of the ZIO monitor.  You must apply for the Patient Assistance Program to qualify for  this discounted rate.  To apply, please call [employer] at [phone], select option 4, select option 2, ask to apply for  Patient Assistance Program. Meredeth Ide will ask your household income, and how many people  are in your household. They will quote your out-of-pocket cost based on that information.  [employer] will also be able to set up a 39-month, interest-free payment plan if needed.  Applying the monitor   Shave hair from upper left chest.  Hold abrader disc by orange tab. Rub abrader in 40 strokes over the upper left chest as  indicated in your monitor instructions.  Clean area with 4 enclosed alcohol pads. Let dry.  Apply patch as indicated in monitor instructions. Patch will be placed under collarbone on left  side of chest with arrow pointing upward.  Rub patch adhesive wings for 2 minutes. Remove white label marked "1". Remove the white  label marked "2". Rub patch adhesive wings for 2 additional minutes.  While looking in a mirror, press and release button in center of patch. A small green light will  flash 3-4 times. This will be your only indicator that the monitor has been turned on.  Do not shower for the first 24 hours. You may shower after the first 24 hours.  Press the button if you feel a symptom. You will hear a small click. Record Date, Time and  Symptom in the Patient Logbook.  When you are ready to remove the patch, follow instructions on the last 2 pages of Patient  Logbook. Stick patch monitor  onto the last page of Patient Logbook.  Place Patient Logbook in the blue and white box. Use locking tab on box and tape box closed  securely. The blue and white box has prepaid postage on it. Please place it in the mailbox as  soon as possible. Your physician should have your test results approximately 7 days after the  monitor has been mailed back to [facility].  Call [facility] Customer Care at [phone] if you have questions regarding  your ZIO XT patch monitor.  Call them immediately if you see an orange light blinking on your  monitor.  If your monitor falls off in less than 4 days, contact our Monitor department at [phone].  If your monitor becomes loose or falls off after 4 days call [employer] at [phone] for  suggestions on securing your monitor    Follow-Up: At [facility], you and your health needs are our priority.  As part of our continuing mission to provide you with exceptional heart care, we have created designated Provider Care Teams.  These Care Teams include your primary Cardiologist (physician) and Advanced Practice Providers (APPs -  Physician Assistants and Nurse Practitioners) who all work together to provide you with the care you need, when you need it.  We recommend signing up for the patient portal called "MyChart".  Sign up information is provided on this After Visit Summary.  MyChart is used to connect with patients for Virtual Visits (Telemedicine).  Patients are able to view lab/test results, encounter notes, upcoming appointments, etc.  Non-urgent messages can be sent to your provider as well.   To learn more about what you can do with MyChart, go to [email or web address].    Your next appointment:   3 month(s)  Provider:   Marjie Skiff, PA-C, Juanda Crumble, PA-C, Joni Reining, DNP, ANP, Azalee Course, PA-C, or Bernadene Person, NP      Then, Nanetta Batty, MD will plan to see you again in 6 month(s).

## 2022-12-25 NOTE — Assessment & Plan Note (Signed)
Patient gets daily palpitations usually in the morning.  She does not drink caffeine.  She recently was seen in the ER [facility] 12/10/2022 because of tachypalpitations in the middle of the day associate with chest pain.  Her enzymes were negative her EKG showed no acute changes and sinus rhythm.  I am going to get a 2-week Zio patch to further evaluate.

## 2022-12-25 NOTE — Progress Notes (Unsigned)
Enrolled patient for a 14 day Zio XT  monitor to be mailed to patients home  °

## 2022-12-28 ENCOUNTER — Telehealth: Payer: Self-pay

## 2022-12-28 NOTE — Telephone Encounter (Signed)
Pt calls in today to discuss her elevated triglyceride levels. She is concerned by how elevated (236mg /dL) this number is compared with 7 months ago when it was down to 79mg /dL. Pt states that she does not feel like her lifestyle or habits are reflected in these numbers, pt states that she doesn't drink very often and she tries to eat right. Pt does mention that she decided to stop taking all her medications due to multiple adverse side effects. She states she has been back on all cholesterol medications (repatha, crestor, & nexlizet) for about 1 month now. Explained this could have an impact on recent lab work. Pt also brings up that she does have lupus, which she doesn't remember telling Dr. Allyson Sabal about. She mentions that she wonders if there could be a correlation between increased triglyceride levels and lupus. Pt does say that she is still having some aches that might be related to cholesterol medication. Advised pt that we should probably recheck cholesterol levels in 2 months to get an accurate reading on her levels since she has only been consistently back on her medication for 1 month. Will send over to Dr. Allyson Sabal to advise.

## 2022-12-29 DIAGNOSIS — R002 Palpitations: Secondary | ICD-10-CM | POA: Diagnosis not present

## 2023-01-08 ENCOUNTER — Telehealth: Payer: Self-pay | Admitting: Cardiovascular Disease

## 2023-01-08 NOTE — Telephone Encounter (Signed)
  Pt c/o medication issue:  1. Name of Medication: rosuvastatin (CRESTOR) 10 MG tablet   2. How are you currently taking this medication (dosage and times per day)? Will be taking 5mg  daily  3. Are you having a reaction (difficulty breathing--STAT)?  NA  4. What is your medication issue? Patient wanted provider to be aware she will be taking 5 mg daily instead of the 10 mg

## 2023-01-08 NOTE — Telephone Encounter (Signed)
Patient states taking Rosuvastatin 5 mg Not the 10 mg.  The 10 mg was causing aches and pains especially with hands.  Joints in hands were swollen and painful.   She is not sure if she has taken just the 5 mg or not.

## 2023-01-08 NOTE — Telephone Encounter (Signed)
Rebecca Gess, MD  You    I think a Pharm D visit is a great idea.   Spoke with pt regarding a PharmD appointment. Pt would like to differ until November because of everything she has going on in her life. She would like to come back in November and see how her cholesterol levels are doing and move forward from there. Pt has appointment with Irving Burton, NP in October, we will push this to November. Pt will plan to come to this appointment fasting. We will get labs at that appointment, if cholesterol levels are good we will not continue to titrate cholesterol medications. Pt verbalizes understanding.

## 2023-01-11 NOTE — Telephone Encounter (Signed)
Patient states she will talk over your recommendations with her husband and give [place] a call and let us know what she decides

## 2023-03-10 ENCOUNTER — Ambulatory Visit: Payer: 59 | Admitting: Nurse Practitioner

## 2023-03-19 ENCOUNTER — Other Ambulatory Visit: Payer: Self-pay | Admitting: Cardiovascular Disease

## 2023-03-26 ENCOUNTER — Ambulatory Visit: Payer: 59 | Admitting: Nurse Practitioner

## 2023-04-13 NOTE — Progress Notes (Unsigned)
Office Visit    Patient Name: Rebecca Pollard Date of Encounter: 04/15/2023  Primary Care Provider:  Mattie Marlin, DO Primary Cardiologist:  Nanetta Batty, MD  Chief Complaint    62 year old female with a history of coronary artery soft plaque, palpitations, hypertension, hyperlipidemia, CKD, GERD, glaucoma and fibromyalgia who presents for follow-up related to chest pain and palpitations.   Past Medical History    Past Medical History:  Diagnosis Date   Asthma    Chronic bronchitis (HCC)    Chronic fatigue    CKD (chronic kidney disease) stage 3, GFR 30-59 ml/min (HCC) 01/26/2019   Fibromyalgia    GERD (gastroesophageal reflux disease)    Glaucoma    Hypertension 2019   IBS (irritable bowel syndrome)    Meningioma (HCC)    OSA (obstructive sleep apnea) 05/30/2019   Osteopenia 09/2016   T score -1.3 FRAX 5.3%/0.3%   Palpitations    echo and stress test normal 2015 per pt   Secondary polycythemia 2020   Urinary incontinence    Past Surgical History:  Procedure Laterality Date   CHOLECYSTECTOMY  1989   ERCP     TRANSTHORACIC ECHOCARDIOGRAM  11/2013   EF 50-55%, normal LV function, normal wall motion, triavial MR and mild tricuspid regurg.    Allergies  Allergies  Allergen Reactions   Albuterol Sulfate Other (See Comments)   Losartan     Decreased kidney fx   Rosuvastatin     myalgia   Esomeprazole Palpitations   Magnesium-Containing Compounds Palpitations   Potassium-Containing Compounds Palpitations     Labs/Other Studies Reviewed    The following studies were reviewed today:  Cardiac Studies & Procedures         MONITORS  LONG TERM MONITOR (3-14 DAYS) 01/15/2023  Narrative Patch Wear Time:  13 days and 22 hours (2024-08-06T08:48:25-0400 to 2024-08-20T06:59:03-399)  Patient had a min HR of 48 bpm, max HR of 136 bpm, and avg HR of 74 bpm. Predominant underlying rhythm was Sinus Rhythm. Isolated SVEs were rare (<1.0%), SVE Couplets were rare  (<1.0%), and no SVE Triplets were present. Isolated VEs were rare (<1.0%), and no VE Couplets or VE Triplets were present.  SR/SB/ST Occasional PACs/PVCs   CT SCANS  CT CORONARY MORPH W/CTA COR W/SCORE 08/11/2019  Addendum 08/12/2019 12:22 PM ADDENDUM REPORT: 08/12/2019 12:20  CLINICAL DATA:  Chest pain  EXAM: Cardiac CTA  MEDICATIONS: Sub lingual nitro. 4mg  x 2  TECHNIQUE: The patient was scanned on a Siemens 192 slice scanner. Gantry rotation speed was 250 msecs. Collimation was 0.6 mm. A 100 kV prospective scan was triggered in the ascending thoracic aorta at 35-75% of the R-R interval. Average HR during the scan was 60 bpm. The 3D data set was interpreted on a dedicated work station using MPR, MIP and VRT modes. A total of 80cc of contrast was used.  FINDINGS: Non-cardiac: See separate report from [facility].  Pulmonary veins drain normally to the left atrium.  Calcium Score: 0 Agatston units.  Coronary Arteries: Right dominant with no anomalies  LM: No plaque or stenosis.  LAD system: No plaque or stenosis.  Circumflex system: No plaque or stenosis.  RCA system: Soft plaque mid RCA, possible modreate (51-69%) stenosis.  IMPRESSION: 1. Coronary calcium score 0 Agatston units. This suggests low risk for future cardiac events.  2. There is narrowing in the mid-RCA, possible moderate stenosis. No calcification at the site of narrowing, possible soft plaque. Will send for FFR.  [employer]  Electronically Signed By: Marca Ancona M.D. On: 08/12/2019 12:20  Narrative EXAM: OVER-READ INTERPRETATION  CT CHEST  The following report is an over-read performed by radiologist Dr. Jeronimo Greaves of [facility] on 08/11/2019. This over-read does not include interpretation of cardiac or coronary anatomy or pathology. The coronary CTA interpretation by the cardiologist is attached.  COMPARISON:  Chest radiograph 11/11/2018.  No prior  CT.  FINDINGS: Vascular: Aortic atherosclerosis. Tortuous thoracic aorta. No dissection. No central pulmonary embolism, on this non-dedicated study.  Mediastinum/Nodes: No imaged thoracic adenopathy.  Lungs/Pleura: No pleural fluid. Left upper lobe/lingular pulmonary nodules on the order of 3-4 mm are identified on series 12.  Upper Abdomen: Normal imaged portions of the liver, spleen, stomach.  Musculoskeletal: Midthoracic spondylosis.  IMPRESSION: 1.  No acute findings in the imaged extracardiac chest. 2.  Aortic Atherosclerosis (ICD10-I70.0). 3. Multiple lingular/left upper lobe pulmonary nodules on the order of 3-4 mm. No follow-up needed if patient is low-risk. Non-contrast chest CT can be considered in 12 months if patient is high-risk. This recommendation follows the consensus statement: Guidelines for Management of Incidental Pulmonary Nodules Detected on CT Images: From the Fleischner Society 2017; Radiology 2017; 284:228-243.  Electronically Signed: By: Jeronimo Greaves M.D. On: 08/11/2019 11:02         Recent Labs: 07/30/2022: Hemoglobin 12.2; Platelet Count 287 12/25/2022: ALT 18  Recent Lipid Panel    Component Value Date/Time   CHOL 166 12/25/2022 0950   TRIG 236 (H) 12/25/2022 0950   HDL 69 12/25/2022 0950   CHOLHDL 2.4 12/25/2022 0950   CHOLHDL 4 06/26/2019 1014   VLDL 33.8 06/26/2019 1014   LDLCALC 60 12/25/2022 0950    History of Present Illness    62 year old female with the above past medical history including coronary artery soft plaque, palpitations, hypertension, hyperlipidemia, CKD, GERD, glaucoma and fibromyalgia.  She has a history of atypical chest pain.  Coronary CTA in 07/2019 showed coronary calcium score of 0, soft plaque in the mid RCA, negative FFR.  2 weeks Zio patch in 07/2019 in the setting of palpitations did not reveal any significant arrhythmia.  She was evaluated in the ED in July 2021 in setting of tachypalpitations, chest pain.  Workup  was unrevealing.  She was last seen in the office on 12/25/2022 was stable from a cardiac standpoint.  Repeat cardiac monitor in 12/2022 showed predominantly sinus rhythm, rare PACs and PVCs, no significant arrhythmia.  She presents today for follow-up.  Since her last visit, she has done well from a cardiac standpoint.  She denies any significant palpitations, denies symptoms concerning for angina.  BP has been stable though she has only been taking her amlodipine as needed.  She has not been taking rosuvastatin in the setting of myalgia.  Overall, she reports feeling well.  Home Medications    Current Outpatient Medications  Medication Sig Dispense Refill   albuterol (VENTOLIN HFA) 108 (90 Base) MCG/ACT inhaler Inhale into the lungs as needed.     amLODipine (NORVASC) 2.5 MG tablet Take 1 tablet by mouth daily. Not taking daily     Ascorbic Acid (VITAMIN C) 500 MG CAPS Take 2 tablets by mouth 2 (two) times daily. Pt takes 1 tablet     aspirin EC 81 MG tablet Take 81 mg by mouth daily. Swallow whole.     Bempedoic Acid-Ezetimibe (NEXLIZET) 180-10 MG TABS TAKE 1 TABELT BY MOUTH ONCE A DAY 90 tablet 1   Budesonide (PULMICORT FLEXHALER) 90 MCG/ACT inhaler  Inhale 2 puffs into the lungs 2 (two) times daily. 1 each 5   CALCIUM PO Take by mouth.     co-enzyme Q-10 30 MG capsule Take 30 mg by mouth daily. Take 1 pill daily     Evolocumab (REPATHA SURECLICK) 140 MG/ML SOAJ INJECT 140 MG INTO THE SKIN EVERY 14 (FOURTEEN) DAYS. 6 mL 3   ferrous sulfate 324 MG TBEC Take 324 mg by mouth daily as needed.     ipratropium (ATROVENT) 0.03 % nasal spray PLACE 2 SPRAYS INTO BOTH NOSTRILS 2 (TWO) TIMES DAILY AS NEEDED FOR RHINITIS (OR COUGH). 30 mL 2   Multiple Vitamins-Minerals (MULTIVITAMIN WITH MINERALS) tablet Take 1 tablet by mouth daily.     rosuvastatin (CRESTOR) 5 MG tablet Take 1 tablet (5 mg total) by mouth daily. 90 tablet 3   VITAMIN D PO Take by mouth.     vitamin E (NATURAL VITAMIN E) 180 MG (400  UNITS) capsule      No current facility-administered medications for this visit.     Review of Systems    She denies chest pain, palpitations, dyspnea, pnd, orthopnea, n, v, dizziness, syncope, edema, weight gain, or early satiety. All other systems reviewed and are otherwise negative except as noted above.   Physical Exam    VS:  BP 130/84   Pulse 61   Ht 5\' 5"  (1.651 m)   Wt 167 lb (75.8 kg)   LMP 04/16/2016 (Approximate) Comment: not sexually active  SpO2 99%   BMI 27.79 kg/m   GEN: Well nourished, well developed, in no acute distress. HEENT: normal. Neck: Supple, no JVD, carotid bruits, or masses. Cardiac: RRR, no murmurs, rubs, or gallops. No clubbing, cyanosis, edema.  Radials/DP/PT 2+ and equal bilaterally.  Respiratory:  Respirations regular and unlabored, clear to auscultation bilaterally. GI: Soft, nontender, nondistended, BS + x 4. MS: no deformity or atrophy. Skin: warm and dry, no rash. Neuro:  Strength and sensation are intact. Psych: Normal affect.  Accessory Clinical Findings    ECG personally reviewed by me today -    - no EKG in office today.   Lab Results  Component Value Date   WBC 6.2 07/30/2022   HGB 12.2 07/30/2022   HCT 37.8 07/30/2022   MCV 94.3 07/30/2022   PLT 287 07/30/2022   Lab Results  Component Value Date   CREATININE 0.97 02/18/2021   BUN 11 02/18/2021   NA 143 02/18/2021   K 4.5 02/18/2021   CL 108 02/18/2021   CO2 23 02/18/2021   Lab Results  Component Value Date   ALT 18 12/25/2022   AST 24 12/25/2022   ALKPHOS 107 12/25/2022   BILITOT 0.5 12/25/2022   Lab Results  Component Value Date   CHOL 166 12/25/2022   HDL 69 12/25/2022   LDLCALC 60 12/25/2022   TRIG 236 (H) 12/25/2022   CHOLHDL 2.4 12/25/2022    No results found for: "HGBA1C"  Assessment & Plan   1. Atypical chest pain/Coronary artery soft plaque: Coronary CTA in 07/2019 showed coronary calcium score of 0, soft plaque in the mid RCA, negative FFR. Stable  with no anginal symptoms. No indication for ischemic evaluation. Continue aspirin, amlodipine, Crestor as below, Nexlizet and Repatha.   2. Palpitations: Cardiac monitor in 12/2022 showed predominantly sinus rhythm, rare PACs and PVCs, no significant arrhythmia.She denies any recent palpitations. Reviewed potential triggers.   3. Hypertension: BP slightly elevated in office today.  She has not been taking her amlodipine daily.  Advised her to take amlodipine daily.  Continue to monitor BP report because of the greater than 130/80.  4. Hyperlipidemia: LDL was 60 in 12/2022.  She has not been taking Crestor due to myalgias.  Will try decreasing Crestor to 5 mg daily to see if she tolerates this better.  Continue to monitor symptoms.  Continue Repatha, Nexlizet.   5. Disposition: Follow-up in 4-6 months with Dr. Allyson Sabal.       Joylene Grapes, NP 04/15/2023, 1:11 PM

## 2023-04-15 ENCOUNTER — Encounter: Payer: Self-pay | Admitting: Nurse Practitioner

## 2023-04-15 ENCOUNTER — Ambulatory Visit: Payer: 59 | Attending: Nurse Practitioner | Admitting: Nurse Practitioner

## 2023-04-15 VITALS — BP 130/84 | HR 61 | Ht 65.0 in | Wt 167.0 lb

## 2023-04-15 DIAGNOSIS — I2510 Atherosclerotic heart disease of native coronary artery without angina pectoris: Secondary | ICD-10-CM | POA: Diagnosis not present

## 2023-04-15 DIAGNOSIS — I10 Essential (primary) hypertension: Secondary | ICD-10-CM

## 2023-04-15 DIAGNOSIS — N1831 Chronic kidney disease, stage 3a: Secondary | ICD-10-CM

## 2023-04-15 DIAGNOSIS — R0789 Other chest pain: Secondary | ICD-10-CM

## 2023-04-15 DIAGNOSIS — E785 Hyperlipidemia, unspecified: Secondary | ICD-10-CM

## 2023-04-15 DIAGNOSIS — R002 Palpitations: Secondary | ICD-10-CM

## 2023-04-15 MED ORDER — ROSUVASTATIN CALCIUM 5 MG PO TABS: 5.0000 mg | ORAL_TABLET | Freq: Every day | ORAL | 3 refills | Status: DC

## 2023-04-15 NOTE — Patient Instructions (Signed)
Medication Instructions:  Decrease Crestor 5 mg daily  *If you need a refill on your cardiac medications before your next appointment, please call your pharmacy*   Lab Work: NONE ordered at this time of appointment     Testing/Procedures: NONE ordered at this time of appointment     Follow-Up: At [facility], you and your health needs are our priority.  As part of our continuing mission to provide you with exceptional heart care, we have created designated Provider Care Teams.  These Care Teams include your primary Cardiologist (physician) and Advanced Practice Providers (APPs -  Physician Assistants and Nurse Practitioners) who all work together to provide you with the care you need, when you need it.  We recommend signing up for the patient portal called "MyChart".  Sign up information is provided on this After Visit Summary.  MyChart is used to connect with patients for Virtual Visits (Telemedicine).  Patients are able to view lab/test results, encounter notes, upcoming appointments, etc.  Non-urgent messages can be sent to your provider as well.   To learn more about what you can do with MyChart, go to [email or web address].    Your next appointment:   4-6 month(s)  Provider:   Nanetta Batty, MD     Other Instructions

## 2023-06-29 ENCOUNTER — Ambulatory Visit: Payer: 59 | Attending: Cardiovascular Disease | Admitting: Cardiovascular Disease

## 2023-06-29 ENCOUNTER — Encounter: Payer: Self-pay | Admitting: Cardiovascular Disease

## 2023-06-29 VITALS — BP 132/90 | HR 68 | Ht 65.0 in | Wt 170.0 lb

## 2023-06-29 DIAGNOSIS — I2510 Atherosclerotic heart disease of native coronary artery without angina pectoris: Secondary | ICD-10-CM | POA: Diagnosis not present

## 2023-06-29 DIAGNOSIS — I10 Essential (primary) hypertension: Secondary | ICD-10-CM | POA: Diagnosis not present

## 2023-06-29 DIAGNOSIS — R0789 Other chest pain: Secondary | ICD-10-CM | POA: Diagnosis not present

## 2023-06-29 DIAGNOSIS — E782 Mixed hyperlipidemia: Secondary | ICD-10-CM

## 2023-06-29 NOTE — Progress Notes (Signed)
 06/29/2023 Rebecca Pollard   1960-11-16  [id number]  Primary Physician Rebecca Elouise SQUIBB, DO Primary Cardiologist: Rebecca JINNY Lesches MD Rebecca Pollard, Rebecca Pollard  HPI:  Rebecca Pollard is a 63 y.o.    mildly overweight married Caucasian female mother of 3 children who does not work and was referred by Dr. Catherine for cardiovascular evaluation because of chest pain.    I last saw her in the office 12/25/2022.Rebecca Pollard  She does have a history of treated hypertension hyperlipidemia.  There is no family history of heart disease patient never had a heart attack or stroke.  She did have a GXT approxi-10 years ago for evaluation of chest pain which was negative.  She does have polycythemia of unknown type and is currently being evaluated by hematology.  She had new onset chest pain on 06/22/2019 with 2 episodes were fairly brief.  She had recurrent episode the following day that awakened her from sleep rating to her left shoulder and on the 30th as well.  She said no subsequent episodes.   She had a coronary CTA performed 08/14/2019 that showed a coronary calcium  score of 0 with soft plaque in the mid RCA and FFR of 0.93 suggesting this was not significant.  In addition, she had a 2-week Zio patch that did not reveal any arrhythmias.  I did begin her on atorvastatin  40 mg a day because of an LDL of 183 seen on lipid profile performed 06/26/2019 with subsequent lipid profile performed 10/25/2019 revealing a decline in her LDL down to 100 although she does complain of symptoms compatible statin intolerance with arthralgias.  I think she would benefit from being on a PCSK9 although we will attempt to start her on rosuvastatin  to see if she can tolerate this prior to initiating Repatha .   I referred her to our Pharm.D.'s to begin her on Nexletol .    She also is on Repatha .  She has stopped her statin therapy.  Her most recent lipid profile performed 12/25/2022 revealed total cholesterol 166, LDL of 60 and HDL of 69.  She does get  occasional atypical/noncardiac chest pain.  She was seen in the ER at [facility] 12/10/2022 with tachypalpitations and chest pain.  Her workup was unrevealing.  She did wear an event monitor for 2 weeks 01/17/2023 that only showed occasional PACs and PVCs.  Her palpitations have suddenly resolved spontaneously.   Current Meds  Medication Sig   albuterol  (VENTOLIN  HFA) 108 (90 Base) MCG/ACT inhaler Inhale into the lungs as needed.   amLODipine  (NORVASC ) 2.5 MG tablet Take 1 tablet by mouth daily. Not taking daily   Ascorbic Acid (VITAMIN C) 500 MG CAPS Take 2 tablets by mouth 2 (two) times daily. Pt takes 1 tablet   aspirin EC 81 MG tablet Take 81 mg by mouth daily. Swallow whole.   Bempedoic Acid -Ezetimibe (NEXLIZET ) 180-10 MG TABS TAKE 1 TABELT BY MOUTH ONCE A DAY   Budesonide  (PULMICORT  FLEXHALER) 90 MCG/ACT inhaler Inhale 2 puffs into the lungs 2 (two) times daily.   CALCIUM  PO Take by mouth.   co-enzyme Q-10 30 MG capsule Take 30 mg by mouth daily. Take 1 pill daily   Evolocumab  (REPATHA  SURECLICK) 140 MG/ML SOAJ INJECT 140 MG INTO THE SKIN EVERY 14 (FOURTEEN) DAYS.   ferrous sulfate 324 MG TBEC Take 324 mg by mouth daily as needed.   ipratropium (ATROVENT ) 0.03 % nasal spray PLACE 2 SPRAYS INTO BOTH NOSTRILS 2 (TWO) TIMES DAILY AS NEEDED FOR RHINITIS (  OR COUGH).   Multiple Vitamins-Minerals (MULTIVITAMIN WITH MINERALS) tablet Take 1 tablet by mouth daily.   rosuvastatin  (CRESTOR ) 5 MG tablet Take 1 tablet (5 mg total) by mouth daily.   VITAMIN D  PO Take by mouth.   vitamin E (NATURAL VITAMIN E) 180 MG (400 UNITS) capsule      Allergies  Allergen Reactions   Albuterol  Sulfate Other (See Comments)   Losartan      Decreased kidney fx   Rosuvastatin      myalgia   Esomeprazole  Palpitations   Magnesium -Containing Compounds Palpitations   Potassium-Containing Compounds Palpitations    Social History   Socioeconomic History   Marital status: Married    Spouse name: Deward   Number  of children: 3   Years of education: Not on file   Highest education level: Not on file  Occupational History   Occupation: Dance movement psychotherapist  Tobacco Use   Smoking status: Never   Smokeless tobacco: Never  Vaping Use   Vaping status: Never Used  Substance and Sexual Activity   Alcohol use: Not Currently   Drug use: No   Sexual activity: Not Currently    Partners: Male    Birth control/protection: Post-menopausal    Comment: 1st intercourse- 16, patners- 10  Other Topics Concern   Not on file  Social History Narrative   Married to [place]. 2 children, Alice and [place].    Attended University. Event organiser.    Drinks caffeine( 3 times per week), herbal remedies, daily vitamin use.    Wears her seatbelt, smoke detector in the home, no firearms in the home.    Feels safe in her relationships.    Exercises 2x a week.    Right handed    Social Drivers of Health   Financial Resource Strain: Not on file  Food Insecurity: Low Risk  (06/21/2023)   Received from [facility]   Hunger Vital Sign    Worried About Running Out of Food in the Last Year: Never true    Ran Out of Food in the Last Year: Never true  Transportation Needs: No Transportation Needs (06/21/2023)   Received from [facility]    In the past 12 months, has lack of reliable transportation kept you from medical appointments, meetings, work or from getting things needed for daily living? : No  Physical Activity: Not on file  Stress: Not on file  Social Connections: Not on file  Intimate Partner Violence: Not on file     Review of Systems: General: negative for chills, fever, night sweats or weight changes.  Cardiovascular: negative for chest pain, dyspnea on exertion, edema, orthopnea, palpitations, paroxysmal nocturnal dyspnea or shortness of breath Dermatological: negative for rash Respiratory: negative for cough or wheezing Urologic: negative for hematuria Abdominal: negative for  nausea, vomiting, diarrhea, bright red blood per rectum, melena, or hematemesis Neurologic: negative for visual changes, syncope, or dizziness All other systems reviewed and are otherwise negative except as noted above.    Blood pressure (!) 132/90, pulse 68, height 5' 5 (1.651 m), weight 170 lb (77.1 kg), last menstrual period 04/16/2016, SpO2 97%.  General appearance: alert and no distress Neck: no adenopathy, no carotid bruit, no JVD, supple, symmetrical, trachea midline, and thyroid  not enlarged, symmetric, no tenderness/mass/nodules Lungs: clear to auscultation bilaterally Heart: regular rate and rhythm, S1, S2 normal, no murmur, click, rub or gallop Extremities: extremities normal, atraumatic, no cyanosis or edema Pulses: 2+ and symmetric Skin: Skin color, texture, turgor normal. No  rashes or lesions Neurologic: Grossly normal  EKG EKG Interpretation Date/Time:  Tuesday June 29 2023 12:10:13 EST Ventricular Rate:  68 PR Interval:  138 QRS Duration:  92 QT Interval:  414 QTC Calculation: 440 R Axis:   45  Text Interpretation: Normal sinus rhythm Normal ECG When compared with ECG of 25-Dec-2022 08:56, No significant change was found Confirmed by Court Carrier [phone]) on 06/29/2023 12:32:23 PM    ASSESSMENT AND PLAN:   Essential hypertension History of essential hypertension blood pressure measured today at 132/90.  She is on low-dose amlodipine .  Hyperlipidemia History of hyperlipidemia on Repatha , Nexlizet   and occasional low-dose rosuvastatin .  Her most recent lipid profile performed 12/25/2022 revealed total cholesterol 166, LDL of 60 and HDL of 69.  I am going to recheck a lipid liver profile.  Atypical chest pain History of occasional atypical chest pain with coronary CTA performed 08/14/2019 revealing a coronary calcium  score of 0 with some soft plaque in the mid LAD.  FFR was 0.93 suggesting this was not significant.     Carrier DOROTHA Court MD FACP,FACC,FAHA,  [facility] 06/29/2023 12:42 PM

## 2023-06-29 NOTE — Assessment & Plan Note (Signed)
History of essential hypertension blood pressure measured today at 132/90.  She is on low-dose amlodipine.

## 2023-06-29 NOTE — Assessment & Plan Note (Signed)
History of occasional atypical chest pain with coronary CTA performed 08/14/2019 revealing a coronary calcium score of 0 with some soft plaque in the mid LAD.  FFR was 0.93 suggesting this was not significant.

## 2023-06-29 NOTE — Assessment & Plan Note (Signed)
History of hyperlipidemia on Repatha, Nexlizet  and occasional low-dose rosuvastatin.  Her most recent lipid profile performed 12/25/2022 revealed total cholesterol 166, LDL of 60 and HDL of 69.  I am going to recheck a lipid liver profile.

## 2023-06-29 NOTE — Patient Instructions (Signed)
 Medication Instructions:  Your physician recommends that you continue on your current medications as directed. Please refer to the Current Medication list given to you today.  *If you need a refill on your cardiac medications before your next appointment, please call your pharmacy*   Lab Work: Your physician recommends that you have labs drawn today: Lipid/liver panel  If you have labs (blood work) drawn today and your tests are completely normal, you will receive your results only by: MyChart Message (if you have MyChart) OR A paper copy in the mail If you have any lab test that is abnormal or we need to change your treatment, we will call you to review the results.   Follow-Up: At [facility], you and your health needs are our priority.  As part of our continuing mission to provide you with exceptional heart care, we have created designated Provider Care Teams.  These Care Teams include your primary Cardiologist (physician) and Advanced Practice Providers (APPs -  Physician Assistants and Nurse Practitioners) who all work together to provide you with the care you need, when you need it.  We recommend signing up for the patient portal called "MyChart".  Sign up information is provided on this After Visit Summary.  MyChart is used to connect with patients for Virtual Visits (Telemedicine).  Patients are able to view lab/test results, encounter notes, upcoming appointments, etc.  Non-urgent messages can be sent to your provider as well.   To learn more about what you can do with MyChart, go to [email or web address].    Your next appointment:   12 month(s)  Provider:   Nanetta Batty, MD     Other Instructions

## 2023-06-30 LAB — HEPATIC FUNCTION PANEL
ALT: 14 [IU]/L (ref 0–32)
AST: 19 [IU]/L (ref 0–40)
Albumin: 4.4 g/dL (ref 3.8–4.9)
Alkaline Phosphatase: 101 [IU]/L (ref 44–121)
Bilirubin Total: 0.3 mg/dL (ref 0.0–1.2)
Bilirubin, Direct: 0.17 mg/dL (ref 0.00–0.40)
Total Protein: 7.1 g/dL (ref 6.0–8.5)

## 2023-06-30 LAB — LIPID PANEL
Chol/HDL Ratio: 1.7 {ratio} (ref 0.0–4.4)
Cholesterol, Total: 144 mg/dL (ref 100–199)
HDL: 85 mg/dL (ref >39)
LDL Chol Calc (NIH): 42 mg/dL (ref 0–99)
Triglycerides: 94 mg/dL (ref 0–149)
VLDL Cholesterol Cal: 17 mg/dL (ref 5–40)

## 2023-08-04 ENCOUNTER — Ambulatory Visit: Payer: 59 | Admitting: Cardiovascular Disease

## 2023-09-08 ENCOUNTER — Other Ambulatory Visit (HOSPITAL_COMMUNITY): Payer: Self-pay

## 2023-09-08 ENCOUNTER — Telehealth: Payer: Self-pay | Admitting: Pharmacy Technician

## 2023-09-08 NOTE — Telephone Encounter (Signed)
 Pharmacy Patient Advocate Encounter   Received notification from Fax that prior authorization for Repatha is required/requested.   Insurance verification completed.   The patient is insured through [employer] [facility] .   Per test claim: PA required; PA submitted to above mentioned insurance via Fax Key/confirmation #/EOC faxed-  [phone] Status is pending

## 2023-09-09 ENCOUNTER — Other Ambulatory Visit: Payer: Self-pay | Admitting: Cardiovascular Disease

## 2023-09-09 DIAGNOSIS — E782 Mixed hyperlipidemia: Secondary | ICD-10-CM

## 2023-09-09 DIAGNOSIS — I2510 Atherosclerotic heart disease of native coronary artery without angina pectoris: Secondary | ICD-10-CM

## 2023-09-09 NOTE — Telephone Encounter (Signed)
 Pharmacy Patient Advocate Encounter  Received notification from [employer] [facility] that Prior Authorization for REPATHA has been APPROVED from 09/09/23 to 09/08/24. Spoke to pharmacy to process.Copay is $15.00.

## 2023-10-14 ENCOUNTER — Other Ambulatory Visit: Payer: Self-pay | Admitting: Cardiovascular Disease

## 2023-10-14 DIAGNOSIS — I2510 Atherosclerotic heart disease of native coronary artery without angina pectoris: Secondary | ICD-10-CM

## 2023-10-14 DIAGNOSIS — E782 Mixed hyperlipidemia: Secondary | ICD-10-CM

## 2023-11-09 ENCOUNTER — Other Ambulatory Visit: Payer: Self-pay | Admitting: Cardiovascular Disease

## 2024-01-26 ENCOUNTER — Other Ambulatory Visit: Payer: Self-pay | Admitting: Nurse Practitioner

## 2024-04-19 ENCOUNTER — Other Ambulatory Visit: Payer: Self-pay | Admitting: Cardiovascular Disease

## 2024-04-19 DIAGNOSIS — E782 Mixed hyperlipidemia: Secondary | ICD-10-CM

## 2024-04-19 DIAGNOSIS — I2510 Atherosclerotic heart disease of native coronary artery without angina pectoris: Secondary | ICD-10-CM
